# Patient Record
Sex: Female | Born: 1950 | Race: White | Hispanic: No | Marital: Married | Smoking: Current some day smoker
Health system: Southern US, Community
[De-identification: ages and names within clinical notes are randomized; demographics above are authoritative.]

## PROBLEM LIST (undated history)

## (undated) DIAGNOSIS — I2121 ST elevation (STEMI) myocardial infarction involving left circumflex coronary artery: Secondary | ICD-10-CM

## (undated) DIAGNOSIS — Z9861 Coronary angioplasty status: Secondary | ICD-10-CM

## (undated) DIAGNOSIS — C3490 Malignant neoplasm of unspecified part of unspecified bronchus or lung: Secondary | ICD-10-CM

## (undated) DIAGNOSIS — I10 Essential (primary) hypertension: Secondary | ICD-10-CM

## (undated) DIAGNOSIS — Z720 Tobacco use: Secondary | ICD-10-CM

## (undated) DIAGNOSIS — Z955 Presence of coronary angioplasty implant and graft: Secondary | ICD-10-CM

## (undated) DIAGNOSIS — E785 Hyperlipidemia, unspecified: Secondary | ICD-10-CM

## (undated) DIAGNOSIS — I2510 Atherosclerotic heart disease of native coronary artery without angina pectoris: Secondary | ICD-10-CM

## (undated) DIAGNOSIS — M1990 Unspecified osteoarthritis, unspecified site: Secondary | ICD-10-CM

## (undated) HISTORY — PX: OTHER SURGICAL HISTORY: SHX169

## (undated) HISTORY — PX: CHOLECYSTECTOMY: SHX55

## (undated) HISTORY — PX: TONSILLECTOMY: SUR1361

## (undated) HISTORY — PX: TUBAL LIGATION: SHX77

---

## 1998-07-11 ENCOUNTER — Other Ambulatory Visit: Admission: RE | Admit: 1998-07-11 | Discharge: 1998-07-11 | Payer: Self-pay | Admitting: *Deleted

## 1999-06-08 ENCOUNTER — Other Ambulatory Visit: Admission: RE | Admit: 1999-06-08 | Discharge: 1999-06-08 | Payer: Self-pay | Admitting: *Deleted

## 2000-07-25 ENCOUNTER — Other Ambulatory Visit: Admission: RE | Admit: 2000-07-25 | Discharge: 2000-07-25 | Payer: Self-pay | Admitting: *Deleted

## 2008-05-03 HISTORY — PX: KNEE ARTHROSCOPY: SUR90

## 2017-02-19 ENCOUNTER — Emergency Department (HOSPITAL_COMMUNITY): Payer: Medicare Other

## 2017-02-19 ENCOUNTER — Encounter (HOSPITAL_COMMUNITY)
Admission: EM | Disposition: A | Discharge status: Home / Self Care | Payer: Self-pay | Source: Home / Self Care | Attending: Cardiology

## 2017-02-19 ENCOUNTER — Inpatient Hospital Stay (HOSPITAL_COMMUNITY): Payer: Medicare Other

## 2017-02-19 ENCOUNTER — Encounter (HOSPITAL_COMMUNITY): Payer: Self-pay | Admitting: Emergency Medicine

## 2017-02-19 ENCOUNTER — Inpatient Hospital Stay (HOSPITAL_COMMUNITY)
Admission: EM | Admit: 2017-02-19 | Discharge: 2017-02-22 | DRG: 247 | Disposition: A | Discharge status: Home / Self Care | Payer: Medicare Other | Attending: Cardiology | Admitting: Cardiology

## 2017-02-19 DIAGNOSIS — R079 Chest pain, unspecified: Secondary | ICD-10-CM | POA: Diagnosis present

## 2017-02-19 DIAGNOSIS — E876 Hypokalemia: Secondary | ICD-10-CM | POA: Diagnosis not present

## 2017-02-19 DIAGNOSIS — R112 Nausea with vomiting, unspecified: Secondary | ICD-10-CM | POA: Diagnosis not present

## 2017-02-19 DIAGNOSIS — I2129 ST elevation (STEMI) myocardial infarction involving other sites: Secondary | ICD-10-CM

## 2017-02-19 DIAGNOSIS — I2121 ST elevation (STEMI) myocardial infarction involving left circumflex coronary artery: Secondary | ICD-10-CM | POA: Diagnosis not present

## 2017-02-19 DIAGNOSIS — Z23 Encounter for immunization: Secondary | ICD-10-CM

## 2017-02-19 DIAGNOSIS — Z8249 Family history of ischemic heart disease and other diseases of the circulatory system: Secondary | ICD-10-CM | POA: Diagnosis not present

## 2017-02-19 DIAGNOSIS — I4729 Other ventricular tachycardia: Secondary | ICD-10-CM

## 2017-02-19 DIAGNOSIS — Z9861 Coronary angioplasty status: Secondary | ICD-10-CM

## 2017-02-19 DIAGNOSIS — Z955 Presence of coronary angioplasty implant and graft: Secondary | ICD-10-CM | POA: Diagnosis not present

## 2017-02-19 DIAGNOSIS — E782 Mixed hyperlipidemia: Secondary | ICD-10-CM | POA: Diagnosis present

## 2017-02-19 DIAGNOSIS — I472 Ventricular tachycardia: Secondary | ICD-10-CM | POA: Diagnosis not present

## 2017-02-19 DIAGNOSIS — L03114 Cellulitis of left upper limb: Secondary | ICD-10-CM | POA: Diagnosis not present

## 2017-02-19 DIAGNOSIS — E785 Hyperlipidemia, unspecified: Secondary | ICD-10-CM | POA: Diagnosis present

## 2017-02-19 DIAGNOSIS — I213 ST elevation (STEMI) myocardial infarction of unspecified site: Secondary | ICD-10-CM | POA: Insufficient documentation

## 2017-02-19 DIAGNOSIS — I25110 Atherosclerotic heart disease of native coronary artery with unstable angina pectoris: Secondary | ICD-10-CM | POA: Diagnosis present

## 2017-02-19 DIAGNOSIS — F17210 Nicotine dependence, cigarettes, uncomplicated: Secondary | ICD-10-CM | POA: Diagnosis present

## 2017-02-19 DIAGNOSIS — I2510 Atherosclerotic heart disease of native coronary artery without angina pectoris: Secondary | ICD-10-CM

## 2017-02-19 DIAGNOSIS — Z720 Tobacco use: Secondary | ICD-10-CM

## 2017-02-19 DIAGNOSIS — I10 Essential (primary) hypertension: Secondary | ICD-10-CM | POA: Diagnosis present

## 2017-02-19 HISTORY — DX: Atherosclerotic heart disease of native coronary artery without angina pectoris: I25.10

## 2017-02-19 HISTORY — DX: Tobacco use: Z72.0

## 2017-02-19 HISTORY — DX: Hyperlipidemia, unspecified: E78.5

## 2017-02-19 HISTORY — DX: Essential (primary) hypertension: I10

## 2017-02-19 HISTORY — PX: LEFT HEART CATH AND CORONARY ANGIOGRAPHY: CATH118249

## 2017-02-19 HISTORY — PX: CORONARY STENT INTERVENTION: CATH118234

## 2017-02-19 HISTORY — DX: Coronary angioplasty status: Z98.61

## 2017-02-19 HISTORY — DX: ST elevation (STEMI) myocardial infarction involving left circumflex coronary artery: I21.21

## 2017-02-19 HISTORY — DX: Presence of coronary angioplasty implant and graft: Z95.5

## 2017-02-19 LAB — PROTIME-INR
INR: 0.87
Prothrombin Time: 11.7 seconds (ref 11.4–15.2)

## 2017-02-19 LAB — CBC WITH DIFFERENTIAL/PLATELET
BASOS ABS: 0.1 10*3/uL (ref 0.0–0.1)
BASOS PCT: 1 %
EOS ABS: 0.1 10*3/uL (ref 0.0–0.7)
EOS PCT: 1 %
HCT: 43 % (ref 36.0–46.0)
HEMOGLOBIN: 15.1 g/dL — AB (ref 12.0–15.0)
LYMPHS ABS: 1.7 10*3/uL (ref 0.7–4.0)
Lymphocytes Relative: 18 %
MCH: 31.7 pg (ref 26.0–34.0)
MCHC: 35.1 g/dL (ref 30.0–36.0)
MCV: 90.1 fL (ref 78.0–100.0)
Monocytes Absolute: 0.6 10*3/uL (ref 0.1–1.0)
Monocytes Relative: 6 %
NEUTROS PCT: 74 %
Neutro Abs: 6.9 10*3/uL (ref 1.7–7.7)
PLATELETS: 203 10*3/uL (ref 150–400)
RBC: 4.77 MIL/uL (ref 3.87–5.11)
RDW: 12.8 % (ref 11.5–15.5)
WBC: 9.3 10*3/uL (ref 4.0–10.5)

## 2017-02-19 LAB — I-STAT CHEM 8, ED
BUN: 11 mg/dL (ref 6–20)
CALCIUM ION: 0.95 mmol/L — AB (ref 1.15–1.40)
Chloride: 107 mmol/L (ref 101–111)
Creatinine, Ser: 0.6 mg/dL (ref 0.44–1.00)
Glucose, Bld: 138 mg/dL — ABNORMAL HIGH (ref 65–99)
HEMATOCRIT: 45 % (ref 36.0–46.0)
HEMOGLOBIN: 15.3 g/dL — AB (ref 12.0–15.0)
Potassium: 4.4 mmol/L (ref 3.5–5.1)
SODIUM: 139 mmol/L (ref 135–145)
TCO2: 24 mmol/L (ref 22–32)

## 2017-02-19 LAB — MAGNESIUM: Magnesium: 2.1 mg/dL (ref 1.7–2.4)

## 2017-02-19 LAB — LIPID PANEL
CHOL/HDL RATIO: 4 ratio
Cholesterol: 188 mg/dL (ref 0–200)
HDL: 47 mg/dL (ref >40)
LDL CALC: 128 mg/dL — AB (ref 0–99)
Triglycerides: 65 mg/dL (ref <150)
VLDL: 13 mg/dL (ref 0–40)

## 2017-02-19 LAB — I-STAT TROPONIN, ED: TROPONIN I, POC: 0.16 ng/mL — AB (ref 0.00–0.08)

## 2017-02-19 LAB — CBC
HEMATOCRIT: 42.5 % (ref 36.0–46.0)
Hemoglobin: 14.8 g/dL (ref 12.0–15.0)
MCH: 31.2 pg (ref 26.0–34.0)
MCHC: 34.8 g/dL (ref 30.0–36.0)
MCV: 89.7 fL (ref 78.0–100.0)
Platelets: 217 10*3/uL (ref 150–400)
RBC: 4.74 MIL/uL (ref 3.87–5.11)
RDW: 12.8 % (ref 11.5–15.5)
WBC: 12.5 10*3/uL — ABNORMAL HIGH (ref 4.0–10.5)

## 2017-02-19 LAB — APTT: APTT: 28 s (ref 24–36)

## 2017-02-19 LAB — HEMOGLOBIN A1C
Hgb A1c MFr Bld: 5.5 % (ref 4.8–5.6)
Mean Plasma Glucose: 111.15 mg/dL

## 2017-02-19 LAB — CREATININE, SERUM: CREATININE: 0.64 mg/dL (ref 0.44–1.00)

## 2017-02-19 LAB — TSH: TSH: 0.757 u[IU]/mL (ref 0.350–4.500)

## 2017-02-19 LAB — TROPONIN I: TROPONIN I: 2.22 ng/mL — AB (ref <0.03)

## 2017-02-19 LAB — MRSA PCR SCREENING: MRSA by PCR: NEGATIVE

## 2017-02-19 SURGERY — LEFT HEART CATH AND CORONARY ANGIOGRAPHY
Anesthesia: LOCAL

## 2017-02-19 MED ORDER — ASPIRIN EC 81 MG PO TBEC: 81.0000 mg | DELAYED_RELEASE_TABLET | Freq: Every day | ORAL | Status: DC

## 2017-02-19 MED ORDER — NITROGLYCERIN IN D5W 200-5 MCG/ML-% IV SOLN
INTRAVENOUS | Status: AC | PRN
  Administered 2017-02-19: 10 ug/min via INTRAVENOUS

## 2017-02-19 MED ORDER — HEPARIN SODIUM (PORCINE) 1000 UNIT/ML IJ SOLN
INTRAMUSCULAR | Status: AC
  Filled 2017-02-19: qty 1

## 2017-02-19 MED ORDER — IOPAMIDOL (ISOVUE-370) INJECTION 76%
INTRAVENOUS | Status: DC | PRN
  Administered 2017-02-19: 170 mL via INTRA_ARTERIAL

## 2017-02-19 MED ORDER — SODIUM CHLORIDE 0.9% FLUSH
3.0000 mL | Freq: Two times a day (BID) | INTRAVENOUS | Status: DC
  Administered 2017-02-19: 10 mL via INTRAVENOUS
  Administered 2017-02-20: 3 mL via INTRAVENOUS
  Administered 2017-02-20: 10 mL via INTRAVENOUS
  Administered 2017-02-21: 3 mL via INTRAVENOUS

## 2017-02-19 MED ORDER — IOPAMIDOL (ISOVUE-370) INJECTION 76%
INTRAVENOUS | Status: AC
  Filled 2017-02-19: qty 125

## 2017-02-19 MED ORDER — LIDOCAINE HCL 2 % IJ SOLN
INTRAMUSCULAR | Status: AC
  Filled 2017-02-19: qty 10

## 2017-02-19 MED ORDER — TIROFIBAN (AGGRASTAT) BOLUS VIA INFUSION
INTRAVENOUS | Status: DC | PRN
  Administered 2017-02-19: 2050 ug via INTRAVENOUS

## 2017-02-19 MED ORDER — MIDAZOLAM HCL 2 MG/2ML IJ SOLN
INTRAMUSCULAR | Status: DC | PRN
  Administered 2017-02-19: 1 mg via INTRAVENOUS

## 2017-02-19 MED ORDER — METOPROLOL TARTRATE 25 MG PO TABS
25.0000 mg | ORAL_TABLET | Freq: Two times a day (BID) | ORAL | Status: DC
  Administered 2017-02-19 – 2017-02-22 (×6): 25 mg via ORAL
  Filled 2017-02-19 (×6): qty 1

## 2017-02-19 MED ORDER — SODIUM CHLORIDE 0.9 % IV SOLN: 250.0000 mL | INTRAVENOUS | Status: DC | PRN

## 2017-02-19 MED ORDER — VERAPAMIL HCL 2.5 MG/ML IV SOLN
INTRAVENOUS | Status: AC
  Filled 2017-02-19: qty 2

## 2017-02-19 MED ORDER — TICAGRELOR 90 MG PO TABS
ORAL_TABLET | ORAL | Status: DC | PRN
  Administered 2017-02-19: 180 mg via ORAL

## 2017-02-19 MED ORDER — HEPARIN SODIUM (PORCINE) 5000 UNIT/ML IJ SOLN: 5000.0000 [IU] | Freq: Three times a day (TID) | INTRAMUSCULAR | Status: DC

## 2017-02-19 MED ORDER — NITROGLYCERIN 1 MG/10 ML FOR IR/CATH LAB
INTRA_ARTERIAL | Status: AC
  Filled 2017-02-19: qty 10

## 2017-02-19 MED ORDER — TICAGRELOR 90 MG PO TABS
ORAL_TABLET | ORAL | Status: AC
  Filled 2017-02-19: qty 2

## 2017-02-19 MED ORDER — TIROFIBAN HCL IN NACL 5-0.9 MG/100ML-% IV SOLN
0.1500 ug/kg/min | INTRAVENOUS | Status: DC
  Administered 2017-02-19: 0.15 ug/kg/min via INTRAVENOUS
  Filled 2017-02-19: qty 100

## 2017-02-19 MED ORDER — HYDRALAZINE HCL 20 MG/ML IJ SOLN: 5.0000 mg | INTRAMUSCULAR | Status: AC | PRN

## 2017-02-19 MED ORDER — NITROGLYCERIN IN D5W 200-5 MCG/ML-% IV SOLN
0.0000 ug/min | INTRAVENOUS | Status: DC
  Administered 2017-02-19: 10 ug/min via INTRAVENOUS

## 2017-02-19 MED ORDER — ACETAMINOPHEN 325 MG PO TABS: 650.0000 mg | ORAL_TABLET | ORAL | Status: DC | PRN

## 2017-02-19 MED ORDER — TIROFIBAN HCL IV 12.5 MG/250 ML
INTRAVENOUS | Status: AC | PRN
  Administered 2017-02-19: .15 ug/kg/min via INTRAVENOUS

## 2017-02-19 MED ORDER — SODIUM CHLORIDE 0.9% FLUSH: 3.0000 mL | INTRAVENOUS | Status: DC | PRN

## 2017-02-19 MED ORDER — LABETALOL HCL 5 MG/ML IV SOLN: 10.0000 mg | INTRAVENOUS | Status: AC | PRN

## 2017-02-19 MED ORDER — SODIUM CHLORIDE 0.9 % IV SOLN
INTRAVENOUS | Status: AC
  Administered 2017-02-19: 18:00:00 via INTRAVENOUS

## 2017-02-19 MED ORDER — NITROGLYCERIN 0.4 MG SL SUBL: 0.4000 mg | SUBLINGUAL_TABLET | SUBLINGUAL | Status: DC | PRN

## 2017-02-19 MED ORDER — HEPARIN SODIUM (PORCINE) 5000 UNIT/ML IJ SOLN: 4000.0000 [IU] | Freq: Once | INTRAMUSCULAR | Status: DC

## 2017-02-19 MED ORDER — TIROFIBAN HCL IN NACL 5-0.9 MG/100ML-% IV SOLN
0.1500 ug/kg/min | INTRAVENOUS | Status: AC
  Administered 2017-02-20 (×2): 0.15 ug/kg/min via INTRAVENOUS
  Filled 2017-02-19 (×3): qty 100

## 2017-02-19 MED ORDER — ONDANSETRON HCL 4 MG/2ML IJ SOLN
4.0000 mg | Freq: Four times a day (QID) | INTRAMUSCULAR | Status: DC | PRN
  Administered 2017-02-20: 4 mg via INTRAVENOUS
  Filled 2017-02-19: qty 2

## 2017-02-19 MED ORDER — ASPIRIN 81 MG PO CHEW
81.0000 mg | CHEWABLE_TABLET | Freq: Every day | ORAL | Status: DC
  Administered 2017-02-20 – 2017-02-22 (×3): 81 mg via ORAL
  Filled 2017-02-19 (×3): qty 1

## 2017-02-19 MED ORDER — SODIUM CHLORIDE 0.9 % IV SOLN
INTRAVENOUS | Status: DC
  Administered 2017-02-19: 18:00:00 via INTRAVENOUS

## 2017-02-19 MED ORDER — TICAGRELOR 90 MG PO TABS
90.0000 mg | ORAL_TABLET | Freq: Two times a day (BID) | ORAL | Status: DC
  Administered 2017-02-20 – 2017-02-21 (×3): 90 mg via ORAL
  Filled 2017-02-19 (×3): qty 1

## 2017-02-19 MED ORDER — TIROFIBAN HCL IN NACL 5-0.9 MG/100ML-% IV SOLN
INTRAVENOUS | Status: AC
  Filled 2017-02-19: qty 100

## 2017-02-19 MED ORDER — VERAPAMIL HCL 2.5 MG/ML IV SOLN
INTRAVENOUS | Status: DC | PRN
  Administered 2017-02-19: 10 mL via INTRA_ARTERIAL

## 2017-02-19 MED ORDER — HEPARIN (PORCINE) IN NACL 2-0.9 UNIT/ML-% IJ SOLN
INTRAMUSCULAR | Status: AC
  Filled 2017-02-19: qty 1000

## 2017-02-19 MED ORDER — ATORVASTATIN CALCIUM 80 MG PO TABS
80.0000 mg | ORAL_TABLET | Freq: Every day | ORAL | Status: DC
  Administered 2017-02-19 – 2017-02-21 (×3): 80 mg via ORAL
  Filled 2017-02-19 (×3): qty 1

## 2017-02-19 MED ORDER — FENTANYL CITRATE (PF) 100 MCG/2ML IJ SOLN
INTRAMUSCULAR | Status: AC
  Filled 2017-02-19: qty 2

## 2017-02-19 MED ORDER — HEPARIN (PORCINE) IN NACL 2-0.9 UNIT/ML-% IJ SOLN
INTRAMUSCULAR | Status: AC | PRN
  Administered 2017-02-19: 1000 mL

## 2017-02-19 MED ORDER — HEPARIN SODIUM (PORCINE) 1000 UNIT/ML IJ SOLN
INTRAMUSCULAR | Status: DC | PRN
  Administered 2017-02-19 (×2): 4500 [IU] via INTRAVENOUS

## 2017-02-19 MED ORDER — MIDAZOLAM HCL 2 MG/2ML IJ SOLN
INTRAMUSCULAR | Status: AC
  Filled 2017-02-19: qty 2

## 2017-02-19 MED ORDER — LIDOCAINE HCL (PF) 1 % IJ SOLN
INTRAMUSCULAR | Status: DC | PRN
  Administered 2017-02-19: 2 mL via INTRADERMAL

## 2017-02-19 MED ORDER — NITROGLYCERIN IN D5W 200-5 MCG/ML-% IV SOLN
INTRAVENOUS | Status: AC
  Filled 2017-02-19: qty 250

## 2017-02-19 MED ORDER — FENTANYL CITRATE (PF) 100 MCG/2ML IJ SOLN
INTRAMUSCULAR | Status: DC | PRN
  Administered 2017-02-19: 25 ug via INTRAVENOUS

## 2017-02-19 MED ORDER — HEPARIN SODIUM (PORCINE) 5000 UNIT/ML IJ SOLN
5000.0000 [IU] | Freq: Three times a day (TID) | INTRAMUSCULAR | Status: DC
  Administered 2017-02-20 – 2017-02-21 (×4): 5000 [IU] via SUBCUTANEOUS
  Filled 2017-02-19 (×4): qty 1

## 2017-02-19 SURGICAL SUPPLY — 20 items
BALLN SAPPHIRE 2.5X12 (BALLOONS) ×2
BALLN ~~LOC~~ EUPHORA RX 3.75X12 (BALLOONS) ×2
BALLOON SAPPHIRE 2.5X12 (BALLOONS) IMPLANT
BALLOON ~~LOC~~ EUPHORA RX 3.75X12 (BALLOONS) IMPLANT
CATH INFINITI 5 FR JL3.5 (CATHETERS) ×1 IMPLANT
CATH INFINITI 5FR ANG PIGTAIL (CATHETERS) ×1 IMPLANT
CATH INFINITI JR4 5F (CATHETERS) ×1 IMPLANT
CATH LAUNCHER 6FR EBU3.5 (CATHETERS) ×1 IMPLANT
DEVICE RAD COMP TR BAND LRG (VASCULAR PRODUCTS) ×1 IMPLANT
GLIDESHEATH SLEND SS 6F .021 (SHEATH) ×1 IMPLANT
GUIDEWIRE INQWIRE 1.5J.035X260 (WIRE) IMPLANT
INQWIRE 1.5J .035X260CM (WIRE) ×2
KIT HEART LEFT (KITS) ×2 IMPLANT
PACK CARDIAC CATHETERIZATION (CUSTOM PROCEDURE TRAY) ×2 IMPLANT
STENT SIERRA 3.50 X 15 MM (Permanent Stent) ×1 IMPLANT
SYR MEDRAD MARK V 150ML (SYRINGE) ×2 IMPLANT
TRANSDUCER W/STOPCOCK (MISCELLANEOUS) ×2 IMPLANT
TUBING CIL FLEX 10 FLL-RA (TUBING) ×2 IMPLANT
WIRE ASAHI PROWATER 180CM (WIRE) ×1 IMPLANT
WIRE HI TORQ WHISPER MS 190CM (WIRE) ×1 IMPLANT

## 2017-02-19 NOTE — ED Triage Notes (Signed)
Per EMS, pt from home with c/o centralized chest pain that began at 1400 today, numbness and tingling to the left arm. Pt reports similar episode two days ago, but symptoms resolved. Given 2 NTG and 324 aspirin PTA. Chest pain resolved at this time. EMS vitals: BP 176/102, HR-70-80, SpO2-97% room air.

## 2017-02-19 NOTE — ED Notes (Addendum)
Kimberly Huang (husband) [phone]

## 2017-02-19 NOTE — Plan of Care (Signed)
Problem: Cardiovascular: Goal: Ability to achieve and maintain adequate cardiovascular perfusion will improve Outcome: Progressing RN assessed patients RUE for perfusion as this is where the TR band is.  Patient able to maintain a level 0-level 1.

## 2017-02-19 NOTE — ED Provider Notes (Signed)
[facility] EMERGENCY DEPARTMENT Provider Note   CSN: [id number] Arrival date & time: 02/19/17  1536     History   Chief Complaint Chief Complaint  Patient presents with  . Code STEMI    HPI Kimberly Huang is a 66 y.o. female.  66 year old female history of depression, hypothyroid, and OSA who presents via EMS with concern for posterior STEMI.  Patient describes onset of substernal burning sensation that she thought was reflux.  Began having radiation of numbness to her left arm.  She called for EMS and received 324 aspirin and 2 nitroglycerin, which appeared to help the chest pain.  She has had no previous MI or cardiac stents.  EMS EKG concerning for ST elevation in V7-9 of posterior EKG with reciprocal depressions.  Patient currently with minimal chest pain on arrival.  The history is provided by the patient, medical records and the EMS personnel. No language interpreter was used.    Past Medical History:  Diagnosis Date  . Depression   . Fibromyalgia   . Hypothyroid   . OSA (obstructive sleep apnea)   . Restless leg syndrome     There are no active problems to display for this patient.   Past Surgical History:  Procedure Laterality Date  . ABDOMINAL HYSTERECTOMY    . chocolate cyst    . CHOLECYSTECTOMY    . TONSILECTOMY, ADENOIDECTOMY, BILATERAL MYRINGOTOMY AND TUBES      OB History    No data available       Home Medications    Prior to Admission medications   Medication Sig Start Date End Date Taking? Authorizing Provider  DULoxetine (CYMBALTA) 60 MG capsule Take 60 mg by mouth daily.      [provider]  levothyroxine (SYNTHROID, LEVOTHROID) 88 MCG tablet Take 88 mcg by mouth daily.      [provider]  methylphenidate (CONCERTA) 54 MG CR tablet Take 54 mg by mouth every morning.      [provider]  simethicone (MYLICON) 80 MG chewable tablet Chew 80 mg by mouth every 6 (six) hours as needed.       [provider]  TraZODone HCl 150 MG TB24 Take 2 tablets by mouth at bedtime.      [provider]    Family History No family history on file.  Social History Social History  Substance Use Topics  . Smoking status: Never Smoker  . Smokeless tobacco: Never Used  . Alcohol use No     Allergies   Patient has no known allergies.   Review of Systems Review of Systems  Constitutional: Negative for chills and fever.  HENT: Negative for ear pain and sore throat.   Eyes: Negative for pain and visual disturbance.  Respiratory: Negative for cough and shortness of breath.   Cardiovascular: Positive for chest pain. Negative for palpitations.  Gastrointestinal: Negative for abdominal pain and vomiting.  Genitourinary: Negative for dysuria and hematuria.  Musculoskeletal: Negative for arthralgias and back pain.  Skin: Negative for color change and rash.  Neurological: Negative for seizures and syncope.  All other systems reviewed and are negative.    Physical Exam Updated Vital Signs BP (!) 170/89 (BP Location: Left Arm)   Pulse 80   Resp 16   SpO2 98%   Physical Exam  Constitutional: She appears well-developed and well-nourished. No distress.  HENT:  Head: Normocephalic and atraumatic.  Eyes: Conjunctivae are normal.  Neck: Neck supple.  Cardiovascular: Normal rate  and regular rhythm.   No murmur heard. Pulmonary/Chest: Effort normal and breath sounds normal. No respiratory distress. She has no wheezes. She has no rales.  Abdominal: Soft. There is no tenderness.  Musculoskeletal: She exhibits no edema.  Neurological: She is alert. No cranial nerve deficit. Coordination normal.  5/5 motor strength and intact sensation in all extremities. Finger-to-nose intact bilaterally  Skin: Skin is warm and dry.  Nursing note and vitals reviewed.    ED Treatments / Results  Labs (all labs ordered are listed, but only abnormal results are displayed) Labs  Reviewed  CBC WITH DIFFERENTIAL/PLATELET  PROTIME-INR  APTT  COMPREHENSIVE METABOLIC PANEL  TROPONIN I  LIPID PANEL    EKG  EKG Interpretation None       Radiology No results found.  Procedures .Critical Care Performed by: Payton Emerald Authorized by: Payton Emerald   Critical care provider statement:    Critical care time (minutes):  31   Critical care time was exclusive of:  Separately billable procedures and treating other patients and teaching time   Critical care was necessary to treat or prevent imminent or life-threatening deterioration of the following conditions: posterior STEMI.   Critical care was time spent personally by me on the following activities:  Ordering and performing treatments and interventions, development of treatment plan with patient or surrogate, discussions with consultants, ordering and review of laboratory studies, ordering and review of radiographic studies, pulse oximetry, re-evaluation of patient's condition, evaluation of patient's response to treatment, examination of patient, review of old charts and obtaining history from patient or surrogate   I assumed direction of critical care for this patient from another provider in my specialty: no      (including critical care time)  Medications Ordered in ED Medications  0.9 %  sodium chloride infusion (not administered)  heparin injection 4,000 Units (not administered)     Initial Impression / Assessment and Plan / ED Course  I have reviewed the triage vital signs and the nursing notes.  Pertinent labs & imaging results that were available during my care of the patient were reviewed by me and considered in my medical decision making (see chart for details).     26 yoF who p/w chest pain and EKG concerning for posterior STEMI. Received ASA and NTG PTA. Minimal pain on arrival. AF, VSS. Lungs CTAB.  Cardiology evaluated pt at bedside. Agree on posterior STEMI. Pt given heparin. Pt taken emergently  to cath lab.  Pt care d/w Dr. Ellender Hose  Final Clinical Impressions(s) / ED Diagnoses   Final diagnoses:  Acute ST elevation myocardial infarction (STEMI) of posterior wall [facility])    New Prescriptions New Prescriptions   No medications on file     Payton Emerald, MD 02/20/17 1754    Duffy Bruce, MD 02/21/17 [phone]

## 2017-02-19 NOTE — Progress Notes (Signed)
Patient came in as a Code Stemmi. Chaplain came as soon as available and patient was visiting with hger 2 sons and requested I come back at 11:30 so she could have time with her sons. Conard Novak, Chaplain    02/19/17 2300  Clinical Encounter Type  Visited With Patient and family together  Visit Type Initial;Spiritual support  Spiritual Encounters  Spiritual Needs Prayer  Stress Factors  Patient Stress Factors None identified  Family Stress Factors None identified

## 2017-02-19 NOTE — H&P (Signed)
Cardiology Admission History and Physical:   Patient ID: Kimberly Huang; MRN: [id number]; DOB: [birth year]   Admission date: 02/19/2017  Primary Care Provider: No primary care provider on file. Primary Cardiologist: Audrie Kuri [name] MD new   Chief Complaint:  Chest pain  Patient Profile:   Kimberly Huang is a 66 y.o. female with a history of tobacco abuse who presents with acute posterior STEMI.   History of Present Illness:   Kimberly Huang reports that she developed acute indigestion with burning in her chest today at 2 pm. Sudden onset. Unrelieved with antacid therapy so EMS called. Ecg showed ST depression in the anterior leads. With posterior leads there was > 2 mm ST elevation. Code STEMI activated. Patient reports she had similar symptoms 2 days ago that resolved. No dyspnea, palpitations, diaphoresis. No edema or orthopnea. No prior cardiac history. Hasn't seen a doctor in several years. Denies history of DM, HTN, HLD. She does have a family history of CAD with younger sister having an MI. She did get partial relief of her chest pain with sl Ntg. Reports she is not taking any medication at home.   History reviewed. No pertinent past medical history.  Past Surgical History:  Procedure Laterality Date  . CHOLECYSTECTOMY    . tonsillectomy    . TUBAL LIGATION       Medications Prior to Admission: none  Allergies:   No Known Allergies  Social History:   Social History   Social History  . Marital status: Married    Spouse name: N/A  . Number of children: 3  . Years of education: N/A   Occupational History  . Not on file.   Social History Main Topics  . Smoking status: Current Every Day Smoker    Packs/day: 1.00    Types: Cigarettes  . Smokeless tobacco: Never Used  . Alcohol use No  . Drug use: Unknown  . Sexual activity: Not on file   Other Topics Concern  . Not on file   Social History Narrative   Family runs the [employer].    Family History:     The patient's family history includes Diabetes in her mother; Heart attack in her sister.    ROS:  Please see the history of present illness.  All other ROS reviewed and negative.     Physical Exam/Data:   Vitals:   02/19/17 1700 02/19/17 1705 02/19/17 1716 02/19/17 1745  BP:   139/85   Pulse: (!) 0 (!) 0 85   Resp: 12 (!) 0 17   Temp:   98.4 F (36.9 C)   TempSrc:   Oral   SpO2: (!) 0% (!) 0% 97%   Weight:   180 lb (81.6 kg) 173 lb 12.8 oz (78.8 kg)  Height:   5\' 4"  (1.626 m)    No intake or output data in the 24 hours ending 02/19/17 1756 Filed Weights   02/19/17 1716 02/19/17 1745  Weight: 180 lb (81.6 kg) 173 lb 12.8 oz (78.8 kg)   Body mass index is 29.83 kg/m.  General:  Well nourished, well developed, in no acute distress HEENT: normal Lymph: no adenopathy Neck: no JVD or bruits Endocrine:  No thryomegaly Vascular: No carotid bruits; FA pulses 2+ bilaterally without bruits  Cardiac:  normal S1, S2; RRR; no murmur Lungs:  clear to auscultation bilaterally, no wheezing, rhonchi or rales  Abd: soft, nontender, no hepatomegaly  Ext: no edema, pulses 2+= Musculoskeletal:  No deformities, BUE and  BLE strength normal and equal Skin: warm and dry  Neuro:  CNs 2-12 intact, no focal abnormalities noted Psych:  Normal affect    EKG:  The ECG that was done today was personally reviewed and demonstrates NSR with posterior ST elevation.   Relevant CV Studies: none  Laboratory Data:  Chemistry  Recent Labs Lab 02/19/17 1547  NA 139  K 4.4  CL 107  GLUCOSE 138*  BUN 11  CREATININE 0.60    No results for input(s): PROT, ALBUMIN, AST, ALT, ALKPHOS, BILITOT in the last 168 hours. Hematology  Recent Labs Lab 02/19/17 1547 02/19/17 1549  WBC  --  9.3  RBC  --  4.77  HGB 15.3* 15.1*  HCT 45.0 43.0  MCV  --  90.1  MCH  --  31.7  MCHC  --  35.1  RDW  --  12.8  PLT  --  203   Cardiac EnzymesNo results for input(s): TROPONINI in the last 168 hours.    Recent Labs Lab 02/19/17 1545  TROPIPOC 0.16*    BNPNo results for input(s): BNP, PROBNP in the last 168 hours.  DDimer No results for input(s): DDIMER in the last 168 hours.  Radiology/Studies:  Dg Chest Port 1 View  Result Date: 02/19/2017 CLINICAL DATA:  Chest pain, recent cardiac catheterization EXAM: PORTABLE CHEST 1 VIEW COMPARISON:  None. FINDINGS: The heart size and mediastinal contours are within normal limits. Both lungs are clear. The visualized skeletal structures are unremarkable. IMPRESSION: No active disease. Electronically Signed   By: Inez Catalina M.D.   On: 02/19/2017 17:44    Assessment and Plan:   1. Acute posterior STEMI. Patient given ASA and sl Ntg. Will proceed with emergent cardiac cath +/- PCI. Start statin, beta blocker.  2. Elevated BP without prior diagnosis of HTN 3. Tobacco abuse needs cessation counseling.   Severity of Illness: The appropriate patient status for this patient is INPATIENT. Inpatient status is judged to be reasonable and necessary in order to provide the required intensity of service to ensure the patient's safety. The patient's presenting symptoms, physical exam findings, and initial radiographic and laboratory data in the context of their chronic comorbidities is felt to place them at high risk for further clinical deterioration. Furthermore, it is not anticipated that the patient will be medically stable for discharge from the hospital within 2 midnights of admission. The following factors support the patient status of inpatient.   " The patient's presenting symptoms include chest pain. " The worrisome physical exam findings include none. " The initial radiographic and laboratory data are worrisome because of ST elevation on Ecg. " The chronic co-morbidities include Tobacco abuse.   * I certify that at the point of admission it is my clinical judgment that the patient will require inpatient hospital care spanning beyond 2 midnights  from the point of admission due to high intensity of service, high risk for further deterioration and high frequency of surveillance required.*    For questions or updates, please contact [place] Please consult [email or web address] for contact info under Cardiology/STEMI.    Signed, Derk Doubek [name], MD  02/19/2017 5:56 PM

## 2017-02-20 DIAGNOSIS — I2121 ST elevation (STEMI) myocardial infarction involving left circumflex coronary artery: Principal | ICD-10-CM

## 2017-02-20 DIAGNOSIS — R112 Nausea with vomiting, unspecified: Secondary | ICD-10-CM

## 2017-02-20 DIAGNOSIS — E876 Hypokalemia: Secondary | ICD-10-CM

## 2017-02-20 DIAGNOSIS — I4729 Other ventricular tachycardia: Secondary | ICD-10-CM

## 2017-02-20 DIAGNOSIS — I472 Ventricular tachycardia: Secondary | ICD-10-CM

## 2017-02-20 LAB — LIPID PANEL
Cholesterol: 182 mg/dL (ref 0–200)
HDL: 46 mg/dL (ref >40)
LDL CALC: 120 mg/dL — AB (ref 0–99)
Total CHOL/HDL Ratio: 4 RATIO
Triglycerides: 79 mg/dL (ref <150)
VLDL: 16 mg/dL (ref 0–40)

## 2017-02-20 LAB — BASIC METABOLIC PANEL
Anion gap: 8 (ref 5–15)
BUN: 5 mg/dL — ABNORMAL LOW (ref 6–20)
CHLORIDE: 108 mmol/L (ref 101–111)
CO2: 24 mmol/L (ref 22–32)
CREATININE: 0.66 mg/dL (ref 0.44–1.00)
Calcium: 8.5 mg/dL — ABNORMAL LOW (ref 8.9–10.3)
GFR calc non Af Amer: >60 mL/min (ref >60)
Glucose, Bld: 121 mg/dL — ABNORMAL HIGH (ref 65–99)
POTASSIUM: 3.2 mmol/L — AB (ref 3.5–5.1)
Sodium: 140 mmol/L (ref 135–145)

## 2017-02-20 LAB — CBC
HEMATOCRIT: 41.7 % (ref 36.0–46.0)
HEMOGLOBIN: 14.3 g/dL (ref 12.0–15.0)
MCH: 30.7 pg (ref 26.0–34.0)
MCHC: 34.3 g/dL (ref 30.0–36.0)
MCV: 89.5 fL (ref 78.0–100.0)
Platelets: 227 10*3/uL (ref 150–400)
RBC: 4.66 MIL/uL (ref 3.87–5.11)
RDW: 12.9 % (ref 11.5–15.5)
WBC: 11.3 10*3/uL — ABNORMAL HIGH (ref 4.0–10.5)

## 2017-02-20 LAB — TROPONIN I
Troponin I: 11.29 ng/mL (ref <0.03)
Troponin I: 13.76 ng/mL (ref <0.03)

## 2017-02-20 MED ORDER — ISOSORBIDE MONONITRATE ER 30 MG PO TB24
30.0000 mg | ORAL_TABLET | Freq: Every day | ORAL | Status: DC
  Administered 2017-02-20 – 2017-02-21 (×2): 30 mg via ORAL
  Filled 2017-02-20 (×2): qty 1

## 2017-02-20 MED ORDER — POTASSIUM CHLORIDE CRYS ER 20 MEQ PO TBCR
40.0000 meq | EXTENDED_RELEASE_TABLET | ORAL | Status: AC
  Administered 2017-02-20 (×2): 40 meq via ORAL
  Filled 2017-02-20 (×2): qty 2

## 2017-02-20 NOTE — Progress Notes (Signed)
Repeat ECG without change

## 2017-02-20 NOTE — ED Provider Notes (Signed)
I saw and evaluated the patient, reviewed the resident's note and I agree with the findings and plan. I was present and provided direct supervision for all procedures. Please see associated encounter note. Briefly, the patient is a 66 yo F here with acute onset crushing substernal CP. EKG c/f inferior STEMI. Protecting airway on arrival. STEMI activated and pt taken to cath lab. ASA, heparin ordered. Doubt dissection or PE.   EKG Interpretation  Date/Time:  Saturday February 19 2017 15:37:05 EDT Ventricular Rate:  78 PR Interval:    QRS Duration: 93 QT Interval:  385 QTC Calculation: 439 R Axis:   -55 Text Interpretation:  Sinus rhythm Prolonged PR interval Left anterior fascicular block LVH with secondary repolarization abnormality ST depressions in inferior and lateral leads, with concern for POSTERIOR STEMI Confirmed by Duffy Bruce [phone]) on 02/20/2017 12:00:24 PM         Duffy Bruce, MD 02/20/17 1200

## 2017-02-20 NOTE — Progress Notes (Signed)
Progress Note  Patient Name: Kimberly Huang Date of Encounter: 02/20/2017  Primary Cardiologist: PJ     Patient Profile     66 y.o. female  Admitted 10/20 with acute posterior STEMI Cath>>OM1 99%>>o  RCA 90 EF normal with Lateral HK  Anticipate staged PCI monday Subjective   No further retrosternal burning Nauseated and vomiting while   Inpatient Medications    Scheduled Meds: . aspirin  81 mg Oral Daily  . atorvastatin  80 mg Oral q1800  . heparin  5,000 Units Subcutaneous Q8H  . metoprolol tartrate  25 mg Oral BID  . sodium chloride flush  3 mL Intravenous Q12H  . ticagrelor  90 mg Oral BID   Continuous Infusions: . sodium chloride 10 mL/hr at 02/19/17 1757  . sodium chloride    . nitroGLYCERIN 20 mcg/min (02/19/17 2314)  . tirofiban 0.15 mcg/kg/min (02/20/17 0105)   PRN Meds: sodium chloride, acetaminophen, nitroGLYCERIN, ondansetron (ZOFRAN) IV, sodium chloride flush   Vital Signs    Vitals:   02/20/17 0500 02/20/17 0600 02/20/17 0700 02/20/17 0733  BP: (!) 141/84 (!) 155/77 140/79   Pulse: (!) 58 (!) 59 (!) 56   Resp: 19 18 19    Temp:    98.5 F (36.9 C)  TempSrc:    Oral  SpO2: 94% 95% 95%   Weight:      Height:        Intake/Output Summary (Last 24 hours) at 02/20/17 0753 Last data filed at 02/20/17 0700  Gross per 24 hour  Intake          1143.51 ml  Output             1520 ml  Net          -376.49 ml   Filed Weights   02/19/17 1716 02/19/17 1745  Weight: 180 lb (81.6 kg) 173 lb 12.8 oz (78.8 kg)    Telemetry    VTNS 16 btt - Personally Reviewed  ECG   no furterh ST changes 10/Personally Reviewed  Physical Exam   GEN: No acute distress.   Neck: JVDflat Cardiac: RRR, no  murmurs, rubs, or gallops.  Respiratory: Clear to auscultation bilaterally. GI: Soft, nontender, non-distended  MS:  edema; No deformity. Neuro:  Nonfocal  Psych: Normal affect  Skin Warm and dry   Labs    Chemistry Recent Labs Lab 02/19/17 1547  02/19/17 1758 02/20/17 0524  NA 139  --  140  K 4.4  --  3.2*  CL 107  --  108  CO2  --   --  24  GLUCOSE 138*  --  121*  BUN 11  --  5*  CREATININE 0.60 0.64 0.66  CALCIUM  --   --  8.5*  GFRNONAA  --  >60 >60  GFRAA  --  >60 >60  ANIONGAP  --   --  8     Hematology Recent Labs Lab 02/19/17 1549 02/19/17 1758 02/20/17 0524  WBC 9.3 12.5* 11.3*  RBC 4.77 4.74 4.66  HGB 15.1* 14.8 14.3  HCT 43.0 42.5 41.7  MCV 90.1 89.7 89.5  MCH 31.7 31.2 30.7  MCHC 35.1 34.8 34.3  RDW 12.8 12.8 12.9  PLT 203 217 227    Cardiac Enzymes Recent Labs Lab 02/19/17 1758 02/20/17 0024 02/20/17 0524  TROPONINI 2.22* 11.29* 13.76*    Recent Labs Lab 02/19/17 1545  TROPIPOC 0.16*     BNPNo results for input(s): BNP, PROBNP in the last  168 hours.   DDimer No results for input(s): DDIMER in the last 168 hours.   Radiology    Dg Chest Port 1 View  Result Date: 02/19/2017 CLINICAL DATA:  Chest pain, recent cardiac catheterization EXAM: PORTABLE CHEST 1 VIEW COMPARISON:  None. FINDINGS: The heart size and mediastinal contours are within normal limits. Both lungs are clear. The visualized skeletal structures are unremarkable. IMPRESSION: No active disease. Electronically Signed   By: Inez Catalina M.D.   On: 02/19/2017 17:44    Cardiac Studies   As above       Assessment & Plan  STEMI Posterior  2V CAD  Hypertension  VT NS new   Hypokalemia new  Nausea/Vomiting new  Will recheck 12 lead   Suspect VT related to low K wil replete and follow  Staged PCI in am  K repleted  Will use NTG IV>>PO  for BP today with anticipation of ARB/amlodipine post revascularization    Signed, Virl Axe, MD  02/20/2017, 7:53 AM

## 2017-02-20 NOTE — Plan of Care (Signed)
Problem: Activity: Goal: Ability to return to baseline activity level will improve Outcome: Progressing Patient ambulated to BR with assistance, gait steady.   Problem: Cardiovascular: Goal: Vascular access site(s) Level 0-1 will be maintained Outcome: Progressing TR band discontinued and site is a level 1 with no complications noted.

## 2017-02-21 ENCOUNTER — Encounter (HOSPITAL_COMMUNITY): Payer: Self-pay | Admitting: Cardiology

## 2017-02-21 ENCOUNTER — Inpatient Hospital Stay (HOSPITAL_COMMUNITY)
Admission: EM | Disposition: A | Discharge status: Home / Self Care | Payer: Self-pay | Source: Home / Self Care | Attending: Cardiology

## 2017-02-21 DIAGNOSIS — I10 Essential (primary) hypertension: Secondary | ICD-10-CM

## 2017-02-21 DIAGNOSIS — I2129 ST elevation (STEMI) myocardial infarction involving other sites: Secondary | ICD-10-CM | POA: Diagnosis present

## 2017-02-21 DIAGNOSIS — Z9861 Coronary angioplasty status: Secondary | ICD-10-CM

## 2017-02-21 DIAGNOSIS — E785 Hyperlipidemia, unspecified: Secondary | ICD-10-CM

## 2017-02-21 DIAGNOSIS — Z955 Presence of coronary angioplasty implant and graft: Secondary | ICD-10-CM

## 2017-02-21 DIAGNOSIS — E782 Mixed hyperlipidemia: Secondary | ICD-10-CM | POA: Diagnosis present

## 2017-02-21 HISTORY — PX: CORONARY STENT INTERVENTION: CATH118234

## 2017-02-21 HISTORY — DX: Presence of coronary angioplasty implant and graft: Z95.5

## 2017-02-21 HISTORY — PX: CARDIAC CATHETERIZATION: SHX172

## 2017-02-21 HISTORY — DX: Hyperlipidemia, unspecified: E78.5

## 2017-02-21 HISTORY — DX: Essential (primary) hypertension: I10

## 2017-02-21 LAB — BASIC METABOLIC PANEL
Anion gap: 9 (ref 5–15)
BUN: 8 mg/dL (ref 6–20)
CO2: 23 mmol/L (ref 22–32)
CREATININE: 0.67 mg/dL (ref 0.44–1.00)
Calcium: 8.6 mg/dL — ABNORMAL LOW (ref 8.9–10.3)
Chloride: 106 mmol/L (ref 101–111)
GFR calc Af Amer: >60 mL/min (ref >60)
GLUCOSE: 96 mg/dL (ref 65–99)
Potassium: 3.9 mmol/L (ref 3.5–5.1)
SODIUM: 138 mmol/L (ref 135–145)

## 2017-02-21 LAB — POCT ACTIVATED CLOTTING TIME
ACTIVATED CLOTTING TIME: 246 s
ACTIVATED CLOTTING TIME: 378 s

## 2017-02-21 SURGERY — CORONARY STENT INTERVENTION
Anesthesia: LOCAL

## 2017-02-21 MED ORDER — SODIUM CHLORIDE 0.9 % WEIGHT BASED INFUSION
3.0000 mL/kg/h | INTRAVENOUS | Status: DC
  Administered 2017-02-21: 3 mL/kg/h via INTRAVENOUS

## 2017-02-21 MED ORDER — ASPIRIN 81 MG PO CHEW
81.0000 mg | CHEWABLE_TABLET | ORAL | Status: AC
  Administered 2017-02-21: 81 mg via ORAL
  Filled 2017-02-21: qty 1

## 2017-02-21 MED ORDER — SODIUM CHLORIDE 0.9 % IV SOLN: 250.0000 mL | INTRAVENOUS | Status: DC | PRN

## 2017-02-21 MED ORDER — HEPARIN SODIUM (PORCINE) 1000 UNIT/ML IJ SOLN
INTRAMUSCULAR | Status: AC
  Filled 2017-02-21: qty 1

## 2017-02-21 MED ORDER — LIDOCAINE HCL 2 % IJ SOLN
INTRAMUSCULAR | Status: AC
  Filled 2017-02-21: qty 10

## 2017-02-21 MED ORDER — CEPHALEXIN 500 MG PO CAPS
500.0000 mg | ORAL_CAPSULE | Freq: Three times a day (TID) | ORAL | Status: DC
  Administered 2017-02-21 – 2017-02-22 (×3): 500 mg via ORAL
  Filled 2017-02-21 (×4): qty 1

## 2017-02-21 MED ORDER — CEPHALEXIN 500 MG PO CAPS
500.0000 mg | ORAL_CAPSULE | Freq: Three times a day (TID) | ORAL | Status: DC
  Filled 2017-02-21 (×3): qty 1

## 2017-02-21 MED ORDER — SODIUM CHLORIDE 0.9% FLUSH: 3.0000 mL | INTRAVENOUS | Status: DC | PRN

## 2017-02-21 MED ORDER — ONDANSETRON HCL 4 MG/2ML IJ SOLN: 4.0000 mg | Freq: Four times a day (QID) | INTRAMUSCULAR | Status: DC | PRN

## 2017-02-21 MED ORDER — MIDAZOLAM HCL 2 MG/2ML IJ SOLN
INTRAMUSCULAR | Status: DC | PRN
  Administered 2017-02-21 (×2): 1 mg via INTRAVENOUS

## 2017-02-21 MED ORDER — LIDOCAINE HCL 2 % IJ SOLN
INTRAMUSCULAR | Status: DC | PRN
  Administered 2017-02-21: 3 mL

## 2017-02-21 MED ORDER — HEPARIN (PORCINE) IN NACL 2-0.9 UNIT/ML-% IJ SOLN
INTRAMUSCULAR | Status: AC
  Filled 2017-02-21: qty 1000

## 2017-02-21 MED ORDER — HYDRALAZINE HCL 20 MG/ML IJ SOLN: 5.0000 mg | INTRAMUSCULAR | Status: AC | PRN

## 2017-02-21 MED ORDER — ATORVASTATIN CALCIUM 80 MG PO TABS: 80.0000 mg | ORAL_TABLET | Freq: Every day | ORAL | Status: DC

## 2017-02-21 MED ORDER — OXYCODONE HCL 5 MG PO TABS: 5.0000 mg | ORAL_TABLET | ORAL | Status: DC | PRN

## 2017-02-21 MED ORDER — SODIUM CHLORIDE 0.9% FLUSH
3.0000 mL | Freq: Two times a day (BID) | INTRAVENOUS | Status: DC
  Administered 2017-02-22: 3 mL via INTRAVENOUS

## 2017-02-21 MED ORDER — ACETAMINOPHEN 325 MG PO TABS
650.0000 mg | ORAL_TABLET | ORAL | Status: DC | PRN
  Administered 2017-02-21: 650 mg via ORAL
  Filled 2017-02-21: qty 2

## 2017-02-21 MED ORDER — VERAPAMIL HCL 2.5 MG/ML IV SOLN
INTRAVENOUS | Status: DC | PRN
  Administered 2017-02-21: 10 mL via INTRA_ARTERIAL

## 2017-02-21 MED ORDER — HEPARIN SODIUM (PORCINE) 5000 UNIT/ML IJ SOLN
5000.0000 [IU] | Freq: Three times a day (TID) | INTRAMUSCULAR | Status: DC
Start: 2017-02-21 — End: 2017-02-22
  Administered 2017-02-21 – 2017-02-22 (×2): 5000 [IU] via SUBCUTANEOUS
  Filled 2017-02-21 (×2): qty 1

## 2017-02-21 MED ORDER — SODIUM CHLORIDE 0.9 % WEIGHT BASED INFUSION
1.0000 mL/kg/h | INTRAVENOUS | Status: AC
  Administered 2017-02-21: 1 mL/kg/h via INTRAVENOUS

## 2017-02-21 MED ORDER — NITROGLYCERIN 1 MG/10 ML FOR IR/CATH LAB
INTRA_ARTERIAL | Status: AC
  Filled 2017-02-21: qty 10

## 2017-02-21 MED ORDER — ONDANSETRON HCL 4 MG/2ML IJ SOLN
INTRAMUSCULAR | Status: DC | PRN
  Administered 2017-02-21: 4 mg via INTRAVENOUS

## 2017-02-21 MED ORDER — ONDANSETRON HCL 4 MG/2ML IJ SOLN
INTRAMUSCULAR | Status: AC
  Filled 2017-02-21: qty 2

## 2017-02-21 MED ORDER — IOPAMIDOL (ISOVUE-370) INJECTION 76%
INTRAVENOUS | Status: AC
  Filled 2017-02-21: qty 50

## 2017-02-21 MED ORDER — SODIUM CHLORIDE 0.9% FLUSH
3.0000 mL | Freq: Two times a day (BID) | INTRAVENOUS | Status: DC
  Administered 2017-02-21: 3 mL via INTRAVENOUS

## 2017-02-21 MED ORDER — HEPARIN (PORCINE) IN NACL 2-0.9 UNIT/ML-% IJ SOLN
INTRAMUSCULAR | Status: AC | PRN
  Administered 2017-02-21: 1000 mL

## 2017-02-21 MED ORDER — FENTANYL CITRATE (PF) 100 MCG/2ML IJ SOLN
INTRAMUSCULAR | Status: AC
  Filled 2017-02-21: qty 2

## 2017-02-21 MED ORDER — IOPAMIDOL (ISOVUE-370) INJECTION 76%
INTRAVENOUS | Status: AC
  Filled 2017-02-21: qty 125

## 2017-02-21 MED ORDER — SODIUM CHLORIDE 0.9 % WEIGHT BASED INFUSION: 1.0000 mL/kg/h | INTRAVENOUS | Status: DC

## 2017-02-21 MED ORDER — VERAPAMIL HCL 2.5 MG/ML IV SOLN
INTRAVENOUS | Status: AC
  Filled 2017-02-21: qty 2

## 2017-02-21 MED ORDER — MIDAZOLAM HCL 2 MG/2ML IJ SOLN
INTRAMUSCULAR | Status: AC
  Filled 2017-02-21: qty 2

## 2017-02-21 MED ORDER — ASPIRIN 81 MG PO CHEW: 81.0000 mg | CHEWABLE_TABLET | Freq: Every day | ORAL | Status: DC

## 2017-02-21 MED ORDER — IOPAMIDOL (ISOVUE-370) INJECTION 76%
INTRAVENOUS | Status: DC | PRN
  Administered 2017-02-21: 95 mL via INTRA_ARTERIAL

## 2017-02-21 MED ORDER — TICAGRELOR 90 MG PO TABS
90.0000 mg | ORAL_TABLET | Freq: Two times a day (BID) | ORAL | Status: DC
  Administered 2017-02-21 – 2017-02-22 (×2): 90 mg via ORAL
  Filled 2017-02-21 (×2): qty 1

## 2017-02-21 MED ORDER — FENTANYL CITRATE (PF) 100 MCG/2ML IJ SOLN
INTRAMUSCULAR | Status: DC | PRN
  Administered 2017-02-21: 50 ug via INTRAVENOUS

## 2017-02-21 MED ORDER — LABETALOL HCL 5 MG/ML IV SOLN: 10.0000 mg | INTRAVENOUS | Status: AC | PRN

## 2017-02-21 MED ORDER — HEPARIN SODIUM (PORCINE) 1000 UNIT/ML IJ SOLN
INTRAMUSCULAR | Status: DC | PRN
  Administered 2017-02-21: 2000 [IU] via INTRAVENOUS
  Administered 2017-02-21: 8000 [IU] via INTRAVENOUS

## 2017-02-21 MED ORDER — PNEUMOCOCCAL VAC POLYVALENT 25 MCG/0.5ML IJ INJ
0.5000 mL | INJECTION | INTRAMUSCULAR | Status: AC
  Administered 2017-02-22: 0.5 mL via INTRAMUSCULAR
  Filled 2017-02-21: qty 0.5

## 2017-02-21 MED FILL — Lidocaine HCl Local Inj 2%: INTRAMUSCULAR | Qty: 10 | Status: AC

## 2017-02-21 MED FILL — Nitroglycerin IV Soln 100 MCG/ML in D5W: INTRA_ARTERIAL | Qty: 10 | Status: AC

## 2017-02-21 SURGICAL SUPPLY — 16 items
BALLN EUPHORA RX 2.5X20 (BALLOONS) ×2
BALLOON EUPHORA RX 2.5X20 (BALLOONS) IMPLANT
CATH VISTA GUIDE 6FR XBRCA (CATHETERS) ×1 IMPLANT
COVER PRB 48X5XTLSCP FOLD TPE (BAG) IMPLANT
COVER PROBE 5X48 (BAG) ×2
DEVICE RAD COMP TR BAND LRG (VASCULAR PRODUCTS) ×1 IMPLANT
GLIDESHEATH SLEND A-KIT 6F 22G (SHEATH) ×1 IMPLANT
GUIDEWIRE INQWIRE 1.5J.035X260 (WIRE) IMPLANT
INQWIRE 1.5J .035X260CM (WIRE) ×2
KIT ENCORE 26 ADVANTAGE (KITS) ×2 IMPLANT
KIT HEART LEFT (KITS) ×2 IMPLANT
PACK CARDIAC CATHETERIZATION (CUSTOM PROCEDURE TRAY) ×2 IMPLANT
STENT RESOLUTE ONYX 3.0X26 (Permanent Stent) ×1 IMPLANT
TRANSDUCER W/STOPCOCK (MISCELLANEOUS) ×2 IMPLANT
TUBING CIL FLEX 10 FLL-RA (TUBING) ×2 IMPLANT
WIRE ASAHI PROWATER 180CM (WIRE) ×1 IMPLANT

## 2017-02-21 NOTE — Care Management Note (Signed)
Case Management Note  Patient Details  Name: Kimberly Huang MRN: [id number] Date of Birth: 1951-03-25  Subjective/Objective:    From home with spouse, pta indep, patient states she has Medicare / [facility] insurance and she has medication coverage, we are not showing this on file. She states she does not have a PCP , patient states she has someone in mind for her PCP,  She will follow up on her PCP, Patient will be on brilinta, she gave NCM the insurance information, (made copy) will take to ED Admissions.  Also patient is for another stent today.     Per benefit check co pay is 45.00, patient states she will go to [facility] in [place] near [place].  They do have in stock.            Action/Plan: NCM will follow for dc needs.   Expected Discharge Date:                  Expected Discharge Plan:  Home/Self Care  In-House Referral:     Discharge planning Services  CM Consult  Post Acute Care Choice:    Choice offered to:     DME Arranged:    DME Agency:     HH Arranged:    HH Agency:     Status of Service:  In process, will continue to follow  If discussed at Long Length of Stay Meetings, dates discussed:    Additional Comments:  Zenon Mayo, RN 02/21/2017, 2:28 PM

## 2017-02-21 NOTE — Progress Notes (Signed)
Progress Note  Patient Name: Kimberly Huang Date of Encounter: 02/21/2017  Primary Cardiologist: [name]  Subjective   Feels better - no further CP or SOB. Just feels tired - sleepy.  Walked with [place] 370 ft.  Inpatient Medications    Scheduled Meds: . aspirin  81 mg Oral Daily  . atorvastatin  80 mg Oral q1800  . heparin  5,000 Units Subcutaneous Q8H  . isosorbide mononitrate  30 mg Oral Daily  . metoprolol tartrate  25 mg Oral BID  . sodium chloride flush  3 mL Intravenous Q12H  . sodium chloride flush  3 mL Intravenous Q12H  . ticagrelor  90 mg Oral BID   Continuous Infusions: . sodium chloride Stopped (02/20/17 1100)  . sodium chloride    . sodium chloride    . sodium chloride 1 mL/kg/hr (02/21/17 0700)   PRN Meds: sodium chloride, sodium chloride, acetaminophen, nitroGLYCERIN, ondansetron (ZOFRAN) IV, sodium chloride flush, sodium chloride flush   Vital Signs    Vitals:   02/21/17 0500 02/21/17 0600 02/21/17 0700 02/21/17 0800  BP: (!) 139/112 (!) 143/78 138/65 136/79  Pulse: 65 64 60 71  Resp: 14 16 16 17   Temp:   98.2 F (36.8 C)   TempSrc:   Oral   SpO2: 100% 96% 98% 99%  Weight:  182 lb 1.6 oz (82.6 kg)    Height:        Intake/Output Summary (Last 24 hours) at 02/21/17 1116 Last data filed at 02/21/17 0800  Gross per 24 hour  Intake          1158.85 ml  Output             1475 ml  Net          -316.15 ml   Filed Weights   02/19/17 1745 02/21/17 0440 02/21/17 0600  Weight: 173 lb 12.8 oz (78.8 kg) 182 lb 1.6 oz (82.6 kg) 182 lb 1.6 oz (82.6 kg)    Telemetry    S Brady 50s-60s - Personally Reviewed  ECG    NO NEW EKG THIS AM - Personally Reviewed No further VT.  Physical Exam   Physical Exam  Constitutional: She is oriented to person, place, and time. She appears well-developed and well-nourished. No distress.  HENT:  Head: Normocephalic and atraumatic.  Eyes: Pupils are equal, round, and reactive to light. EOM are normal.   Neck: Normal range of motion. No hepatojugular reflux and no JVD present. Carotid bruit is not present.  Cardiovascular: Normal rate, regular rhythm and intact distal pulses.   No extrasystoles are present. PMI is not displaced.  Exam reveals no gallop and no S4.   No murmur heard. Pulmonary/Chest: Effort normal and breath sounds normal. No respiratory distress. She has no wheezes. She has no rales.  Abdominal: Soft. Bowel sounds are normal. She exhibits no distension. There is no tenderness. There is no rebound.  Musculoskeletal: Normal range of motion. She exhibits no edema.  R radial site c/d/i  Neurological: She is alert and oriented to person, place, and time. No cranial nerve deficit.  Skin: Skin is warm and dry. No rash noted. No erythema.  Psychiatric: She has a normal mood and affect. Her behavior is normal. Judgment and thought content normal.  Nursing note and vitals reviewed.   Labs    Chemistry Recent Labs Lab 02/19/17 1547 02/19/17 1758 02/20/17 0524 02/21/17 0203  NA 139  --  140 138  K 4.4  --  3.2* 3.9  CL 107  --  108 106  CO2  --   --  24 23  GLUCOSE 138*  --  121* 96  BUN 11  --  5* 8  CREATININE 0.60 0.64 0.66 0.67  CALCIUM  --   --  8.5* 8.6*  GFRNONAA  --  >60 >60 >60  GFRAA  --  >60 >60 >60  ANIONGAP  --   --  8 9     Hematology Recent Labs Lab 02/19/17 1549 02/19/17 1758 02/20/17 0524  WBC 9.3 12.5* 11.3*  RBC 4.77 4.74 4.66  HGB 15.1* 14.8 14.3  HCT 43.0 42.5 41.7  MCV 90.1 89.7 89.5  MCH 31.7 31.2 30.7  MCHC 35.1 34.8 34.3  RDW 12.8 12.8 12.9  PLT 203 217 227    Cardiac Enzymes Recent Labs Lab 02/19/17 1758 02/20/17 0024 02/20/17 0524  TROPONINI 2.22* 11.29* 13.76*    Recent Labs Lab 02/19/17 1545  TROPIPOC 0.16*     BNPNo results for input(s): BNP, PROBNP in the last 168 hours.   DDimer No results for input(s): DDIMER in the last 168 hours.   Radiology    Dg Chest Port 1 View  Result Date: 02/19/2017 CLINICAL  DATA:  Chest pain, recent cardiac catheterization EXAM: PORTABLE CHEST 1 VIEW COMPARISON:  None. FINDINGS: The heart size and mediastinal contours are within normal limits. Both lungs are clear. The visualized skeletal structures are unremarkable. IMPRESSION: No active disease. Electronically Signed   By: Inez Catalina M.D.   On: 02/19/2017 17:44    Cardiac Studies    CORONARY STENT INTERVENTION  LEFT HEART CATH AND CORONARY ANGIOGRAPHY  Conclusion     Prox LAD to Mid LAD lesion, 20 %stenosed.  Prox RCA to Mid RCA lesion, 90 %stenosed.  Prox Cx to Mid Cx lesion, 30 %stenosed.  The left ventricular systolic function is normal.  LV end diastolic pressure is moderately elevated.  The left ventricular ejection fraction is 50-55% by visual estimate.  1st Mrg lesion, 99 %stenosed.  A STENT SIERRA 3.50 X 15 MM drug eluting stent was successfully placed.  Post intervention, there is a 0% residual stenosis.  Lat 1st Mrg lesion, 100 %stenosed.   1. Severe 2 vessel obstructive CAD    - 99% thrombotic occlusion of first OM. This is a bifurcating vessel.     - 90% mid RCA-segmental 2. Good overall LV dysfunction with lateral HK 3. Moderately elevated LVEDP 4. Successful stenting of the first OM with DES. Distal embolization into the distal lateral OM branch.  Plan: DAPT for one year. Will treat with IV Aggrastat for 18 hours. Start high dose statin, beta blocker. IV Ntg for BP control acutely. Plan for stage PCI of RCA on Monday if no complication.  Diagnostic Diagram       Post-Intervention Diagram         Patient Profile      66 y.o. female  Admitted 10/20 with acute posterior STEMI Cath>>OM1 99%>>o             RCA 90 EF normal with Lateral HK  Anticipate staged PCI monday  Assessment & Plan    Principal Problem:   STEMI involving left circumflex coronary artery (HCC) Active Problems:   CAD S/P PCI OM1 99%-0%; Plan Staged PCI mRCA 90%.   Presence of drug-eluting stent  in L Cx: OM1 (Xience SIERRA DES 3.5 x 15)   Acute MI, true posterior wall, initial episode of care ([place])  NSVT (nonsustained ventricular tachycardia) (HCC) - Post STEMI   Tobacco abuse   Hyperlipidemia with target LDL less than 70   Essential hypertension  Plan - Staged PCI RCA today  STEMI - CAD-PCI: continue ASA/Brilinta; high dose Statin & Beta Blocker  Unable to titrate Metoprolol with resting HR in 50s currently   No further VT = electrolytes replete.  BP stable - can add ARB in AM post Cath.  Smoking cessation counseling provided  Full dose Statin.  Anticipate that she will be OK to transfer to [facility] post PCI today - maybe d/c in AM if stable.  CM assistance with Brilinta card.   Consent for PCI : case discussed with R/B/A/I - pt agrees to proceed.    For questions or updates, please contact [place] Please consult [email or web address] for contact info under Cardiology/STEMI.      Signed, Glenetta Hew, MD  02/21/2017, 11:16 AM

## 2017-02-21 NOTE — Progress Notes (Signed)
CARDIAC REHAB PHASE I   PRE:  Rate/Rhythm: 4 SR  BP:  Sitting: 136/79        SaO2: 97 RA  MODE:  Ambulation: 370 ft   POST:  Rate/Rhythm: 78 SR  BP:  Sitting: 159/95, 151/72 recheck         SaO2: 100 RA  Pt assisted to bathroom prior to ambulation. Pt ambulated 370 ft on RA, IV, handheld assist, steady gait, tolerated well with no complaints. Completed MI/stent education with [place], EP and pt at bedside.  Reviewed risk factors, tobacco cessation (gave pt fake cigarette), MI book, anti-platelet therapy, stent card, activity restrictions, ntg, exercise, heart healthy diet and phase 2 cardiac rehab. Pt verbalized understanding, receptive to education. Pt agrees to phase 2 cardiac rehab referral, will send to [facility] per pt request. Pt assisted to bathroom again, then to bed, call bell within reach. Will follow.   [place], RN, BSN 02/21/2017 10:12 AM

## 2017-02-21 NOTE — Interval H&P Note (Signed)
Cath Lab Visit (complete for each Cath Lab visit)  Clinical Evaluation Leading to the Procedure:   ACS: Yes.    Non-ACS:    Anginal Classification: CCS Huang  Anti-ischemic medical therapy: Maximal Therapy (2 or more classes of medications)  Non-Invasive Test Results: No non-invasive testing performed  Prior CABG: No previous CABG      History and Physical Interval Note:  02/21/2017 3:12 PM  [name]  has presented today for surgery, with the diagnosis of CAD  The various methods of treatment have been discussed with the patient and family. After consideration of risks, benefits and other options for treatment, the patient has consented to  Procedure(s): CORONARY STENT INTERVENTION (N/A) as a surgical intervention .  The patient's history has been reviewed, patient examined, no change in status, stable for surgery.  I have reviewed the patient's chart and labs.  Questions were answered to the patient's satisfaction.     Kimberly Huang

## 2017-02-21 NOTE — H&P (View-Only) (Signed)
Progress Note  Patient Name: Kimberly Huang Date of Encounter: 02/21/2017  Primary Cardiologist: [name]  Subjective   Feels better - no further CP or SOB. Just feels tired - sleepy.  Walked with [place] 370 ft.  Inpatient Medications    Scheduled Meds: . aspirin  81 mg Oral Daily  . atorvastatin  80 mg Oral q1800  . heparin  5,000 Units Subcutaneous Q8H  . isosorbide mononitrate  30 mg Oral Daily  . metoprolol tartrate  25 mg Oral BID  . sodium chloride flush  3 mL Intravenous Q12H  . sodium chloride flush  3 mL Intravenous Q12H  . ticagrelor  90 mg Oral BID   Continuous Infusions: . sodium chloride Stopped (02/20/17 1100)  . sodium chloride    . sodium chloride    . sodium chloride 1 mL/kg/hr (02/21/17 0700)   PRN Meds: sodium chloride, sodium chloride, acetaminophen, nitroGLYCERIN, ondansetron (ZOFRAN) IV, sodium chloride flush, sodium chloride flush   Vital Signs    Vitals:   02/21/17 0500 02/21/17 0600 02/21/17 0700 02/21/17 0800  BP: (!) 139/112 (!) 143/78 138/65 136/79  Pulse: 65 64 60 71  Resp: 14 16 16 17   Temp:   98.2 F (36.8 C)   TempSrc:   Oral   SpO2: 100% 96% 98% 99%  Weight:  182 lb 1.6 oz (82.6 kg)    Height:        Intake/Output Summary (Last 24 hours) at 02/21/17 1116 Last data filed at 02/21/17 0800  Gross per 24 hour  Intake          1158.85 ml  Output             1475 ml  Net          -316.15 ml   Filed Weights   02/19/17 1745 02/21/17 0440 02/21/17 0600  Weight: 173 lb 12.8 oz (78.8 kg) 182 lb 1.6 oz (82.6 kg) 182 lb 1.6 oz (82.6 kg)    Telemetry    S [place] 50s-60s - Personally Reviewed  ECG    NO NEW EKG THIS AM - Personally Reviewed No further VT.  Physical Exam   Physical Exam  Constitutional: She is oriented to person, place, and time. She appears well-developed and well-nourished. No distress.  HENT:  Head: Normocephalic and atraumatic.  Eyes: Pupils are equal, round, and reactive to light. EOM are normal.   Neck: Normal range of motion. No hepatojugular reflux and no JVD present. Carotid bruit is not present.  Cardiovascular: Normal rate, regular rhythm and intact distal pulses.   No extrasystoles are present. PMI is not displaced.  Exam reveals no gallop and no S4.   No murmur heard. Pulmonary/Chest: Effort normal and breath sounds normal. No respiratory distress. She has no wheezes. She has no rales.  Abdominal: Soft. Bowel sounds are normal. She exhibits no distension. There is no tenderness. There is no rebound.  Musculoskeletal: Normal range of motion. She exhibits no edema.  R radial site c/d/i  Neurological: She is alert and oriented to person, place, and time. No cranial nerve deficit.  Skin: Skin is warm and dry. No rash noted. No erythema.  Psychiatric: She has a normal mood and affect. Her behavior is normal. Judgment and thought content normal.  Nursing note and vitals reviewed.   Labs    Chemistry Recent Labs Lab 02/19/17 1547 02/19/17 1758 02/20/17 0524 02/21/17 0203  NA 139  --  140 138  K 4.4  --  3.2* 3.9  CL 107  --  108 106  CO2  --   --  24 23  GLUCOSE 138*  --  121* 96  BUN 11  --  5* 8  CREATININE 0.60 0.64 0.66 0.67  CALCIUM  --   --  8.5* 8.6*  GFRNONAA  --  >60 >60 >60  GFRAA  --  >60 >60 >60  ANIONGAP  --   --  8 9     Hematology Recent Labs Lab 02/19/17 1549 02/19/17 1758 02/20/17 0524  WBC 9.3 12.5* 11.3*  RBC 4.77 4.74 4.66  HGB 15.1* 14.8 14.3  HCT 43.0 42.5 41.7  MCV 90.1 89.7 89.5  MCH 31.7 31.2 30.7  MCHC 35.1 34.8 34.3  RDW 12.8 12.8 12.9  PLT 203 217 227    Cardiac Enzymes Recent Labs Lab 02/19/17 1758 02/20/17 0024 02/20/17 0524  TROPONINI 2.22* 11.29* 13.76*    Recent Labs Lab 02/19/17 1545  TROPIPOC 0.16*     BNPNo results for input(s): BNP, PROBNP in the last 168 hours.   DDimer No results for input(s): DDIMER in the last 168 hours.   Radiology    Dg Chest Port 1 View  Result Date: 02/19/2017 CLINICAL  DATA:  Chest pain, recent cardiac catheterization EXAM: PORTABLE CHEST 1 VIEW COMPARISON:  None. FINDINGS: The heart size and mediastinal contours are within normal limits. Both lungs are clear. The visualized skeletal structures are unremarkable. IMPRESSION: No active disease. Electronically Signed   By: Inez Catalina M.D.   On: 02/19/2017 17:44    Cardiac Studies    CORONARY STENT INTERVENTION  LEFT HEART CATH AND CORONARY ANGIOGRAPHY  Conclusion     Prox LAD to Mid LAD lesion, 20 %stenosed.  Prox RCA to Mid RCA lesion, 90 %stenosed.  Prox Cx to Mid Cx lesion, 30 %stenosed.  The left ventricular systolic function is normal.  LV end diastolic pressure is moderately elevated.  The left ventricular ejection fraction is 50-55% by visual estimate.  1st Mrg lesion, 99 %stenosed.  A STENT SIERRA 3.50 X 15 MM drug eluting stent was successfully placed.  Post intervention, there is a 0% residual stenosis.  Lat 1st Mrg lesion, 100 %stenosed.   1. Severe 2 vessel obstructive CAD    - 99% thrombotic occlusion of first OM. This is a bifurcating vessel.     - 90% mid RCA-segmental 2. Good overall LV dysfunction with lateral HK 3. Moderately elevated LVEDP 4. Successful stenting of the first OM with DES. Distal embolization into the distal lateral OM branch.  Plan: DAPT for one year. Will treat with IV Aggrastat for 18 hours. Start high dose statin, beta blocker. IV Ntg for BP control acutely. Plan for stage PCI of RCA on Monday if no complication.  Diagnostic Diagram       Post-Intervention Diagram         Patient Profile      66 y.o. female  Admitted 10/20 with acute posterior STEMI Cath>>OM1 99%>>o             RCA 90 EF normal with Lateral HK  Anticipate staged PCI monday  Assessment & Plan    Principal Problem:   STEMI involving left circumflex coronary artery (HCC) Active Problems:   CAD S/P PCI OM1 99%-0%; Plan Staged PCI mRCA 90%.   Presence of drug-eluting stent  in L Cx: OM1 (Xience SIERRA DES 3.5 x 15)   Acute MI, true posterior wall, initial episode of care ([place])  NSVT (nonsustained ventricular tachycardia) (HCC) - Post STEMI   Tobacco abuse   Hyperlipidemia with target LDL less than 70   Essential hypertension  Plan - Staged PCI RCA today  STEMI - CAD-PCI: continue ASA/Brilinta; high dose Statin & Beta Blocker  Unable to titrate Metoprolol with resting HR in 50s currently   No further VT = electrolytes replete.  BP stable - can add ARB in AM post Cath.  Smoking cessation counseling provided  Full dose Statin.  Anticipate that she will be OK to transfer to [facility] post PCI today - maybe d/c in AM if stable.  CM assistance with Brilinta card.   Consent for PCI : case discussed with R/B/A/I - pt agrees to proceed.    For questions or updates, please contact [place] Please consult [email or web address] for contact info under Cardiology/STEMI.      Signed, Glenetta Hew, MD  02/21/2017, 11:16 AM

## 2017-02-21 NOTE — Progress Notes (Signed)
#   7.  S/W KATIE @ [facility] # [phone]   BRILINTA 90 MG BID   COVER- YES  CO-PAY- $ 45.00  Q/L TWO PILLS PER DAY  TIER- 3 DRUG  PRIOR APPROVAL- NO   PHARMACY : ANY RETAIL

## 2017-02-22 ENCOUNTER — Encounter (HOSPITAL_COMMUNITY): Payer: Self-pay | Admitting: Interventional Cardiology

## 2017-02-22 DIAGNOSIS — I2129 ST elevation (STEMI) myocardial infarction involving other sites: Secondary | ICD-10-CM

## 2017-02-22 LAB — BASIC METABOLIC PANEL
ANION GAP: 7 (ref 5–15)
BUN: 6 mg/dL (ref 6–20)
CALCIUM: 8.4 mg/dL — AB (ref 8.9–10.3)
CO2: 24 mmol/L (ref 22–32)
Chloride: 108 mmol/L (ref 101–111)
Creatinine, Ser: 0.57 mg/dL (ref 0.44–1.00)
GFR calc non Af Amer: >60 mL/min (ref >60)
Glucose, Bld: 97 mg/dL (ref 65–99)
POTASSIUM: 3.4 mmol/L — AB (ref 3.5–5.1)
Sodium: 139 mmol/L (ref 135–145)

## 2017-02-22 LAB — CBC
HEMATOCRIT: 36.5 % (ref 36.0–46.0)
HEMOGLOBIN: 12.4 g/dL (ref 12.0–15.0)
MCH: 30.6 pg (ref 26.0–34.0)
MCHC: 34 g/dL (ref 30.0–36.0)
MCV: 90.1 fL (ref 78.0–100.0)
Platelets: 192 10*3/uL (ref 150–400)
RBC: 4.05 MIL/uL (ref 3.87–5.11)
RDW: 13 % (ref 11.5–15.5)
WBC: 9.4 10*3/uL (ref 4.0–10.5)

## 2017-02-22 LAB — POCT ACTIVATED CLOTTING TIME: ACTIVATED CLOTTING TIME: 428 s

## 2017-02-22 MED ORDER — ATORVASTATIN CALCIUM 80 MG PO TABS: 80.0000 mg | ORAL_TABLET | Freq: Every day | ORAL | 1 refills | Status: DC

## 2017-02-22 MED ORDER — ASPIRIN 81 MG PO CHEW: 81.0000 mg | CHEWABLE_TABLET | Freq: Every day | ORAL | Status: DC

## 2017-02-22 MED ORDER — TICAGRELOR 90 MG PO TABS: 90.0000 mg | ORAL_TABLET | Freq: Two times a day (BID) | ORAL | 1 refills | Status: DC

## 2017-02-22 MED ORDER — CEPHALEXIN 500 MG PO CAPS: 500.0000 mg | ORAL_CAPSULE | Freq: Three times a day (TID) | ORAL | 0 refills | Status: DC

## 2017-02-22 MED ORDER — INFLUENZA VAC SPLIT HIGH-DOSE 0.5 ML IM SUSY
0.5000 mL | PREFILLED_SYRINGE | Freq: Once | INTRAMUSCULAR | Status: AC
  Administered 2017-02-22: 0.5 mL via INTRAMUSCULAR
  Filled 2017-02-22: qty 0.5

## 2017-02-22 MED ORDER — METOPROLOL TARTRATE 25 MG PO TABS: 25.0000 mg | ORAL_TABLET | Freq: Two times a day (BID) | ORAL | 0 refills | Status: DC

## 2017-02-22 MED ORDER — INFLUENZA VAC SPLIT HIGH-DOSE 0.5 ML IM SUSY: 0.5000 mL | PREFILLED_SYRINGE | INTRAMUSCULAR | Status: DC

## 2017-02-22 MED ORDER — NITROGLYCERIN 0.4 MG SL SUBL: 0.4000 mg | SUBLINGUAL_TABLET | SUBLINGUAL | 2 refills | Status: DC | PRN

## 2017-02-22 MED ORDER — IRBESARTAN 75 MG PO TABS
75.0000 mg | ORAL_TABLET | Freq: Every day | ORAL | Status: DC
  Administered 2017-02-22: 75 mg via ORAL
  Filled 2017-02-22: qty 1

## 2017-02-22 MED ORDER — IRBESARTAN 75 MG PO TABS: 75.0000 mg | ORAL_TABLET | Freq: Every day | ORAL | 0 refills | Status: DC

## 2017-02-22 MED ORDER — POTASSIUM CHLORIDE CRYS ER 20 MEQ PO TBCR
40.0000 meq | EXTENDED_RELEASE_TABLET | Freq: Once | ORAL | Status: AC
Start: 2017-02-22 — End: 2017-02-22
  Administered 2017-02-22: 40 meq via ORAL
  Filled 2017-02-22: qty 2

## 2017-02-22 MED FILL — Heparin Sodium (Porcine) Inj 1000 Unit/ML: INTRAMUSCULAR | Qty: 10 | Status: AC

## 2017-02-22 MED FILL — Nitroglycerin IV Soln 100 MCG/ML in D5W: INTRA_ARTERIAL | Qty: 10 | Status: AC

## 2017-02-22 NOTE — Progress Notes (Signed)
Discharge packet given to patient. Education given about medication regimen and follow up appointments. Patient had no further questions at this time. IV removed and patient taken off telemetry. Patient discharged home with son and wheeled out to the car.

## 2017-02-22 NOTE — Progress Notes (Signed)
CARDIAC REHAB PHASE I   PRE:  Rate/Rhythm: 69 SR    BP: sitting 159/92 left forearm    SaO2:   MODE:  Ambulation: 370 ft   POST:  Rate/Rhythm: 90 SR    BP: sitting 109/82 left upper arm    SaO2:   Tolerated well. No c/o. Very talkative. BP before walk probably not accurate. Ed reviewed, reinforcing Brilinta and smoking cessation. Voiced understanding.  [id number]   [place], ACSM 02/22/2017 11:06 AM

## 2017-02-22 NOTE — Discharge Summary (Signed)
Discharge Summary    Patient ID: JAMILEX BOHNSACK,  MRN: [id number], DOB/AGE: 1951-02-05 66 y.o.  Admit date: 02/19/2017 Discharge date: 02/22/2017  Primary Care Provider: Patient, No Pcp Per Primary Cardiologist: [name]   Discharge Diagnoses    Principal Problem:   CAD S/P PCI OM1 99%-0%; Plan Staged PCI mRCA 90%. Active Problems:   Tobacco abuse   STEMI involving left circumflex coronary artery (HCC)   Hyperlipidemia with target LDL less than 70   Presence of drug-eluting stent in L Cx: OM1 (Xience SIERRA DES 3.5 x 15)   Essential hypertension   Acute MI, true posterior wall, initial episode of care [facility])   NSVT (nonsustained ventricular tachycardia) (HCC) - Post STEMI   Allergies Allergies  Allergen Reactions  . Codeine Nausea Only    Diagnostic Studies/Procedures    Cath: 02/19/17  Conclusion     Prox LAD to Mid LAD lesion, 20 %stenosed.  Prox RCA to Mid RCA lesion, 90 %stenosed.  Prox Cx to Mid Cx lesion, 30 %stenosed.  The left ventricular systolic function is normal.  LV end diastolic pressure is moderately elevated.  The left ventricular ejection fraction is 50-55% by visual estimate.  1st Mrg lesion, 99 %stenosed.  A STENT SIERRA 3.50 X 15 MM drug eluting stent was successfully placed.  Post intervention, there is a 0% residual stenosis.  Lat 1st Mrg lesion, 100 %stenosed.   1. Severe 2 vessel obstructive CAD    - 99% thrombotic occlusion of first OM. This is a bifurcating vessel.     - 90% mid RCA-segmental 2. Good overall LV dysfunction with lateral HK 3. Moderately elevated LVEDP 4. Successful stenting of the first OM with DES. Distal embolization into the distal lateral OM branch.  Plan: DAPT for one year. Will treat with IV Aggrastat for 18 hours. Start high dose statin, beta blocker. IV Ntg for BP control acutely. Plan for stage PCI of RCA on Monday if no complication.   Cath: 02/21/17  Conclusion    Successful stent  implantation in the proximal to mid RCA reducing a segmental 90% stenosis to 0% with TIMI grade 3 flow. Stent used was a 3.0 x 26 Onyx post dilated to 14 atm 2.  RECOMMENDATIONS:   Continue dual antiplatelet therapy (aspirin and Brilinta)  Discharge per discretion of IC team.    _____________   History of Present Illness     Ms. Franta is a 66 yo female with PMH of tobacco use who reported that she developed acute indigestion with burning in her chest today at 2 pm the day of admission. Sudden onset. Unrelieved with antacid therapy so EMS called. Ecg showed ST depression in the anterior leads. With posterior leads there was > 2 mm ST elevation. Code STEMI activated. Patient reported she had similar symptoms 2 days prior that resolved. No dyspnea, palpitations, diaphoresis. No edema or orthopnea. No prior cardiac history. Hasn't seen a doctor in several years. Denied history of DM, HTN, HLD. She did have a family history of CAD with younger sister having an MI. She did get partial relief of her chest pain with sl Ntg. She was brought to the cath lab emergently for cath.   Hospital Course    Underwent cardiac catheterization with Dr. [name] that showed severe 2 vessel CAD, 99% occlusion of first OM which is a bifurcating vessel. Also 90% mid RCA segment lesion. Overall LV function was noted at 50-55%, with lateral hypokinesis. Moderately elevated LVEDP with an  initial successful PCI/DES to first OM, but distal embolization of distal lateral OM branch. Plan for DA PT for one year with aspirin/Brilinta. She was continued on IV Aggrastat for 18 hours, and started on high dose statin beta blocker and continued on IV nitroglycerin. Planned for staged intervention of RCA. Troponin peaked at 13.76, LDL 128 hemoglobin A1c 5.5. Post Labs were stable. Did develop some transient hypo-kalemia that was corrected with oral supplement. Worked well with cardiac rehabilitation after initial cath. Blood pressure  tolerated the addition of beta blocker, and ARB was added. Underwent successful staged intervention to proximal/mid RCA with DES 1 02/21/17. No complications noted post cath. Labs the following morning showed creatinine 0.57 and hemoglobin 12.4. She worked well with cardiac rehabilitation with no complaints. Of note will be discharged on Keflex 558m TID 2/2 cellulitis of Left arm from IV infiltration.   DThayer Headingswas seen by Dr. HEllyn Hackand determined stable for discharge home. Follow up in the office has been arranged. Medications are listed below.   _____________  Discharge Vitals Blood pressure 135/71, pulse 61, temperature 98.8 F (37.1 C), temperature source Oral, resp. rate 16, height _0  (1.626 m), weight 183 lb 14.4 oz (83.4 kg), SpO2 96 %.  Filed Weights   02/21/17 0440 02/21/17 0600 02/22/17 0119  Weight: 182 lb 1.6 oz (82.6 kg) 182 lb 1.6 oz (82.6 kg) 183 lb 14.4 oz (83.4 kg)   General appearance: alert, cooperative, appears stated age and no distress Neck: no adenopathy, no carotid bruit and no JVD Lungs: clear to auscultation bilaterally, normal percussion bilaterally and non-labored Heart: regular rate and rhythm, S1, S2 normal, no murmur, click, rub or gallop and normal apical impulse Abdomen: soft, non-tender; bowel sounds normal; no masses,  no organomegaly Extremities: extremities normal, atraumatic, no cyanosis or edema and L forearm - IV site focal erythema has notably improved. no longer warm or tender. Pulses: 2+ and symmetric Neurologic: Grossly normal   Labs & Radiologic Studies    CBC  Recent Labs  02/19/17 1549  02/20/17 0524 02/22/17 0213  WBC 9.3  < > 11.3* 9.4  NEUTROABS 6.9  --   --   --   HGB 15.1*  < > 14.3 12.4  HCT 43.0  < > 41.7 36.5  MCV 90.1  < > 89.5 90.1  PLT 203  < > 227 192  < > = values in this interval not displayed. Basic Metabolic Panel  Recent Labs  02/19/17 1758  02/21/17 0203 02/22/17 0213  NA  --   < > 138 139    K  --   < > 3.9 3.4*  CL  --   < > 106 108  CO2  --   < > 23 24  GLUCOSE  --   < > 96 97  BUN  --   < > 8 6  CREATININE 0.64  < > 0.67 0.57  CALCIUM  --   < > 8.6* 8.4*  MG 2.1  --   --   --   < > = values in this interval not displayed. Liver Function Tests No results for input(s): AST, ALT, ALKPHOS, BILITOT, PROT, ALBUMIN in the last 72 hours. No results for input(s): LIPASE, AMYLASE in the last 72 hours. Cardiac Enzymes  Recent Labs  02/19/17 1758 02/20/17 0024 02/20/17 0524  TROPONINI 2.22* 11.29* 13.76*   BNP Invalid input(s): POCBNP D-Dimer No results for input(s): DDIMER in the last 72 hours. Hemoglobin A1C  Recent Labs  02/19/17 1758  HGBA1C 5.5   Fasting Lipid Panel  Recent Labs  02/20/17 0524  CHOL 182  HDL 46  LDLCALC 120*  TRIG 79  CHOLHDL 4.0   Thyroid Function Tests  Recent Labs  02/19/17 1758  TSH 0.757   _____________  Dg Chest Port 1 View  Result Date: 02/19/2017 CLINICAL DATA:  Chest pain, recent cardiac catheterization EXAM: PORTABLE CHEST 1 VIEW COMPARISON:  None. FINDINGS: The heart size and mediastinal contours are within normal limits. Both lungs are clear. The visualized skeletal structures are unremarkable. IMPRESSION: No active disease. Electronically Signed   By: Inez Catalina M.D.   On: 02/19/2017 17:44   Disposition   Pt is being discharged home today in good condition.  Follow-up Plans & Appointments    Follow-up Information    Almyra Deforest, [place] Follow up on 03/03/2017.   Specialties:  Cardiology, Radiology Why:  at 3:30pm for your follow up appt. Contact information: [address] [place] [address] [phone]          Discharge Instructions    AMB Referral to Cardiac Rehabilitation - Phase II    Complete by:  As directed    Diagnosis:  STEMI   Amb Referral to Cardiac Rehabilitation    Complete by:  As directed    Diagnosis:   Coronary Stents STEMI     Call MD for:  redness, tenderness,  or signs of infection (pain, swelling, redness, odor or green/yellow discharge around incision site)    Complete by:  As directed    Diet - low sodium heart healthy    Complete by:  As directed    Discharge instructions    Complete by:  As directed    Radial Site Care Refer to this sheet in the next few weeks. These instructions provide you with information on caring for yourself after your procedure. Your caregiver may also give you more specific instructions. Your treatment has been planned according to current medical practices, but problems sometimes occur. Call your caregiver if you have any problems or questions after your procedure. HOME CARE INSTRUCTIONS You may shower the day after the procedure.Remove the bandage (dressing) and gently wash the site with plain soap and water.Gently pat the site dry.  Do not apply powder or lotion to the site.  Do not submerge the affected site in water for 3 to 5 days.  Inspect the site at least twice daily.  Do not flex or bend the affected arm for 24 hours.  No lifting over 5 pounds (2.3 kg) for 5 days after your procedure.  Do not drive home if you are discharged the same day of the procedure. Have someone else drive you.  You may drive 24 hours after the procedure unless otherwise instructed by your caregiver.  What to expect: Any bruising will usually fade within 1 to 2 weeks.  Blood that collects in the tissue (hematoma) may be painful to the touch. It should usually decrease in size and tenderness within 1 to 2 weeks.  SEEK IMMEDIATE MEDICAL CARE IF: You have unusual pain at the radial site.  You have redness, warmth, swelling, or pain at the radial site.  You have drainage (other than a small amount of blood on the dressing).  You have chills.  You have a fever or persistent symptoms for more than 72 hours.  You have a fever and your symptoms suddenly get worse.  Your arm becomes pale, cool, tingly,  or numb.  You have heavy bleeding  from the site. Hold pressure on the site.   PLEASE DO NOT MISS ANY DOSES OF YOUR BRILINTA!!!!! Also keep a log of you blood pressures and bring back to your follow up appt. Please call the office with any questions.   Patients taking blood thinners should generally stay away from medicines like ibuprofen, Advil, Motrin, naproxen, and Aleve due to risk of stomach bleeding. You may take Tylenol as directed or talk to your primary doctor about alternatives.   Increase activity slowly    Complete by:  As directed       Discharge Medications     Medication List    STOP taking these medications   ibuprofen 200 MG tablet Commonly known as:  ADVIL,MOTRIN   TAGAMET PO     TAKE these medications   aspirin 81 MG chewable tablet Chew 1 tablet (81 mg total) by mouth daily.   atorvastatin 80 MG tablet Commonly known as:  LIPITOR Take 1 tablet (80 mg total) by mouth daily at 6 PM.   cephALEXin 500 MG capsule Commonly known as:  KEFLEX Take 1 capsule (500 mg total) by mouth every 8 (eight) hours.   irbesartan 75 MG tablet Commonly known as:  AVAPRO Take 1 tablet (75 mg total) by mouth daily.   metoprolol tartrate 25 MG tablet Commonly known as:  LOPRESSOR Take 1 tablet (25 mg total) by mouth 2 (two) times daily.   nitroGLYCERIN 0.4 MG SL tablet Commonly known as:  NITROSTAT Place 1 tablet (0.4 mg total) under the tongue every 5 (five) minutes x 3 doses as needed for chest pain.   ticagrelor 90 MG Tabs tablet Commonly known as:  BRILINTA Take 1 tablet (90 mg total) by mouth 2 (two) times daily.        Aspirin prescribed at discharge?  Yes High Intensity Statin Prescribed? (Lipitor 40-40m or Crestor 20-410m: Yes Beta Blocker Prescribed? Yes For EF <40%, was ACEI/ARB Prescribed? Yes ADP Receptor Inhibitor Prescribed? (i.e. Plavix etc.-Includes Medically Managed Patients): Yes For EF <40%, Aldosterone Inhibitor Prescribed? No: EF ok Was EF assessed during THIS  hospitalization? Yes Was Cardiac Rehab II ordered? (Included Medically managed Patients): Yes   Outstanding Labs/Studies   FLP/LFTs in 6 weeks if tolerating statin. BMET at follow up given addition of ARB.   Duration of Discharge Encounter   Greater than 30 minutes including physician time.  Signed, LiReino BellisP-C 02/22/2017, 12:48 PM   I have seen, examined and evaluated the patient this AM along with LiReino BellisNP-C.  After reviewing all the available data and chart, we discussed the patients laboratory, study & physical findings as well as symptoms in detail. I agree with her  findings, examination (performed by me) as well as the impression summary & d/c recommendations as per our discussion.    Attending adjustments noted in italics.    89.o.femaleAdmitted 10/20 with acute posterior STEMI Cath>>OM1 99%>>o RCA 90 - Staged PCI Monday 10/22.  EF normal with Lateral HK   Did well, no recurrent angina.  [place] no difficulties.    Started on Beta blocker - but not able to titrate up  Started Avapro 75 mg on d/c day.  DAPT with ASA/Brilinta  High dose Statin.  Mild Hypokalemia replete   During cath yesterday, the staff noted purulent drainage from the L AC IV site - started on Keflex on 10/22 - plan to complete 5 day course. - looks  notably improved today.  [place] for d/c on stable medication regimen.Glenetta Hew, M.D., M.S. Interventional Cardiologist   Pager # [phone] Phone # [phone] [address] [place], [place] [address]

## 2017-03-03 ENCOUNTER — Ambulatory Visit: Payer: Medicare Other | Admitting: Cardiology

## 2017-03-03 ENCOUNTER — Encounter: Payer: Self-pay | Admitting: Cardiology

## 2017-03-03 ENCOUNTER — Ambulatory Visit: Payer: Medicare Other | Admitting: Physician Assistant

## 2017-03-03 DIAGNOSIS — Z720 Tobacco use: Secondary | ICD-10-CM

## 2017-03-03 DIAGNOSIS — E785 Hyperlipidemia, unspecified: Secondary | ICD-10-CM

## 2017-03-03 DIAGNOSIS — I10 Essential (primary) hypertension: Secondary | ICD-10-CM

## 2017-03-03 DIAGNOSIS — I2121 ST elevation (STEMI) myocardial infarction involving left circumflex coronary artery: Secondary | ICD-10-CM | POA: Diagnosis not present

## 2017-03-03 NOTE — Assessment & Plan Note (Signed)
Posterior MI 02/19/17-treated with urgent CFX PCI, followed by staged RCA PCI

## 2017-03-03 NOTE — Patient Instructions (Signed)
Medication Instructions:  START- An over the counter Co-Q 10  If you need a refill on your cardiac medications before your next appointment, please call your pharmacy.  Labwork: None Ordered   Testing/Procedures: None Ordered   Follow-Up: Your physician wants you to follow-up in: 6 weeks with Dr [name].    Thank you for choosing [facility]!!

## 2017-03-03 NOTE — Progress Notes (Signed)
03/03/2017 [name]   05-31-1950  [id number]  Primary Physician Patient, No Pcp Per Primary Cardiologist: Dr [name]  HPI:  Pleasant 66 y/o female with no significant PMH, presented 02/19/17 with chest and posterior STEMI. She said her symptoms were mild "indigestion". They didn't go away after she took a Tagamet and she became concerned and called EMS. She notes her sister had just had an MI and this is why she ended up call EMS. She was taken directly to the cath lab and recieved a DES to her CFX. She had residual RCA disease that was intervened on 48 hours later. Her LVF was preserved. She is in the office today for follow up. She has done well since discharge, no further chest pain. She has noted "aching" in her thighs and low back.   Current Outpatient Prescriptions  Medication Sig Dispense Refill  . aspirin 81 MG chewable tablet Chew 1 tablet (81 mg total) by mouth daily.    Marland Kitchen atorvastatin (LIPITOR) 80 MG tablet Take 1 tablet (80 mg total) by mouth daily at 6 PM. 90 tablet 1  . irbesartan (AVAPRO) 75 MG tablet Take 1 tablet (75 mg total) by mouth daily. 90 tablet 0  . metoprolol tartrate (LOPRESSOR) 25 MG tablet Take 1 tablet (25 mg total) by mouth 2 (two) times daily. 180 tablet 0  . nitroGLYCERIN (NITROSTAT) 0.4 MG SL tablet Place 1 tablet (0.4 mg total) under the tongue every 5 (five) minutes x 3 doses as needed for chest pain. 25 tablet 2  . ticagrelor (BRILINTA) 90 MG TABS tablet Take 1 tablet (90 mg total) by mouth 2 (two) times daily. 180 tablet 1   No current facility-administered medications for this visit.     Allergies  Allergen Reactions  . Codeine Nausea Only    Past Medical History:  Diagnosis Date  . CAD S/P PCI OM1 99%-0%; Plan Staged PCI mRCA 90%. 02/19/2017   1. Severe 2 vessel obstructive CAD    - 99% thrombotic occlusion of first OM. This is a bifurcating vessel.     - 90% mid RCA-segmental 2. Good overall LV dysfunction with lateral HK 3. Moderately  elevated LVEDP 4. Successful stenting of the first OM with DES. Distal embolization into the distal lateral OM branch.  Plan: DAPT for one year. Will treat with IV Aggrastat for 18 hours. Start high dose statin, beta blocker. IV Ntg for BP control acutely. Plan for stage PCI of RCA on Monday 38/10 if no complication.  . Essential hypertension 02/21/2017  . Hyperlipidemia with target LDL less than 70 02/21/2017  . Presence of drug-eluting stent in L Cx: OM1 (Xience SIERRA DES 3.5 x 15) 02/21/2017  . STEMI involving left circumflex coronary artery ([place]) 02/19/2017  . Tobacco abuse 02/19/2017    Social History   Social History  . Marital status: Married    Spouse name: N/A  . Number of children: 3  . Years of education: N/A   Occupational History  . Not on file.   Social History Main Topics  . Smoking status: Current Some Day Smoker    Packs/day: 1.00    Types: Cigarettes  . Smokeless tobacco: Never Used     Comment: Trying to quit  . Alcohol use No  . Drug use: Unknown  . Sexual activity: Not on file   Other Topics Concern  . Not on file   Social History Narrative   Family runs the [employer].  Family History  Problem Relation Age of Onset  . Diabetes Mother   . Heart attack Sister      Review of Systems: General: negative for chills, fever, night sweats or weight changes.  Cardiovascular: negative for chest pain, dyspnea on exertion, edema, orthopnea, palpitations, paroxysmal nocturnal dyspnea or shortness of breath Dermatological: negative for rash Respiratory: negative for cough or wheezing Urologic: negative for hematuria Abdominal: negative for nausea, vomiting, diarrhea, bright red blood per rectum, melena, or hematemesis Neurologic: negative for visual changes, syncope, or dizziness All other systems reviewed and are otherwise negative except as noted above.    Blood pressure 110/68, pulse 72, height 5\' 4"  (1.626 m), weight 182 lb (82.6 kg).    General appearance: alert, cooperative and no distress Neck: no carotid bruit and no JVD Lungs: clear to auscultation bilaterally Heart: regular rate and rhythm Extremities: ecchymosis Rt for arm Skin: Skin color, texture, turgor normal. No rashes or lesions Neurologic: Grossly normal  EKG NSR, lateral TWI  ASSESSMENT AND PLAN:   STEMI involving left circumflex coronary artery (HCC) Posterior MI 02/19/17-treated with urgent CFX PCI, followed by staged RCA PCI  Hyperlipidemia with target LDL less than 70 She has noted some thigh and low back discomfort that sounds like it could be secondary to her statin Rx  Essential hypertension Controlled  Tobacco abuse She tells me she has pretty much quit   PLAN  I suggested she take CoQ 10 along with her statin. We discussed the importance of complete smoking cessation. She can start cardiac rehab in 5 weeks, f/u Dr [name] in 6 weeks.  Kerin Ransom PA-C 03/03/2017 4:44 PM

## 2017-03-03 NOTE — Assessment & Plan Note (Signed)
She tells me she has pretty much quit

## 2017-03-03 NOTE — Assessment & Plan Note (Signed)
Controlled.  

## 2017-03-03 NOTE — Assessment & Plan Note (Signed)
She has noted some thigh and low back discomfort that sounds like it could be secondary to her statin Rx

## 2017-03-10 ENCOUNTER — Ambulatory Visit: Payer: Medicare Other | Admitting: Cardiology

## 2017-03-15 ENCOUNTER — Ambulatory Visit: Payer: Self-pay | Admitting: Family Medicine

## 2017-03-23 ENCOUNTER — Telehealth: Payer: Self-pay

## 2017-03-23 NOTE — Telephone Encounter (Signed)
Pt called and needs Brilinta 90mg  BID samples. They are at the front desk for her to P/U

## 2017-04-04 ENCOUNTER — Other Ambulatory Visit: Payer: Self-pay | Admitting: Cardiology

## 2017-04-04 NOTE — Telephone Encounter (Signed)
Patient calling the office for samples of medication:   1.  What medication and dosage are you requesting samples for? Brilinta  2.  Are you currently out of this medication? Enough for today

## 2017-04-04 NOTE — Telephone Encounter (Signed)
Medication samples have been provided to the patient.  Drug name: Brilinta 90mg   Qty: 4 bottles = 32 tablets   LOT: [id number] x2, [id number] x2  Exp.Date: 09/2019, 12/2019  Samples left at front desk for patient pick-up. Patient notified.  Sheral Apley M 3:00 PM 04/04/2017

## 2017-04-20 ENCOUNTER — Telehealth: Payer: Self-pay | Admitting: Cardiology

## 2017-04-20 NOTE — Telephone Encounter (Signed)
Patient calling the office for samples of medication:   1.  What medication and dosage are you requesting samples for? Brilnta  2.  Are you currently out of this medication?  1 left

## 2017-04-20 NOTE — Telephone Encounter (Signed)
Returned call to patient Brilinta 90 mg samples left at [employer] office front desk.

## 2017-04-22 ENCOUNTER — Telehealth (HOSPITAL_COMMUNITY): Payer: Self-pay

## 2017-04-22 NOTE — Telephone Encounter (Signed)
Attempted to call patient in regards to Cardiac Rehab and verify insurance- Lm on vm

## 2017-05-06 ENCOUNTER — Telehealth: Payer: Self-pay | Admitting: Cardiology

## 2017-05-06 NOTE — Telephone Encounter (Signed)
Patient walked in office Brilinta 90 mg samples given to patient.

## 2017-05-06 NOTE — Telephone Encounter (Signed)
Patient calling the office for samples of medication:   1.  What medication and dosage are you requesting samples for? Brilinta 90 mg   2.  Are you currently out of this medication? Will run out on Saturday

## 2017-05-22 NOTE — Progress Notes (Signed)
05/23/2017 [name]   [birth year]  [id number]  Primary Physician Patient, No Pcp Per Primary Cardiologist: Dr [name]  HPI:  Pleasant 67 y/o female with no significant PMH, presented 02/19/17 with chest and posterior STEMI. She said her symptoms were mild "indigestion". They didn't go away after she took a Tagamet and she became concerned and called EMS. She notes her sister had just had an MI and this is why she ended up call EMS. She was taken directly to the cath lab and recieved a DES to her CFX. She had residual RCA disease that was intervened on 48 hours later. Her LVF was preserved.   On follow up today she is doing well. No chest pain or dyspnea. No palpitations. She is walking her dog several times a day. Never did start Cardiac Rehab. She has reduced smoking to 2-3 cigs/day. Tolerating medication well but Brilinta is costing $200/month.   Current Outpatient Medications  Medication Sig Dispense Refill  . aspirin 81 MG chewable tablet Chew 1 tablet (81 mg total) by mouth daily.    Marland Kitchen atorvastatin (LIPITOR) 80 MG tablet Take 1 tablet (80 mg total) by mouth daily at 6 PM. 90 tablet 1  . irbesartan (AVAPRO) 75 MG tablet Take 1 tablet (75 mg total) by mouth daily. 90 tablet 0  . metoprolol tartrate (LOPRESSOR) 25 MG tablet Take 1 tablet (25 mg total) by mouth 2 (two) times daily. 180 tablet 0  . nitroGLYCERIN (NITROSTAT) 0.4 MG SL tablet Place 1 tablet (0.4 mg total) under the tongue every 5 (five) minutes x 3 doses as needed for chest pain. 25 tablet 2  . clopidogrel (PLAVIX) 75 MG tablet Take 1 tablet (75 mg total) by mouth daily. 90 tablet 3   No current facility-administered medications for this visit.     Allergies  Allergen Reactions  . Codeine Nausea Only    Past Medical History:  Diagnosis Date  . CAD S/P PCI OM1 99%-0%; Plan Staged PCI mRCA 90%. 02/19/2017   1. Severe 2 vessel obstructive CAD    - 99% thrombotic occlusion of first OM. This is a bifurcating vessel.      - 90% mid RCA-segmental 2. Good overall LV dysfunction with lateral HK 3. Moderately elevated LVEDP 4. Successful stenting of the first OM with DES. Distal embolization into the distal lateral OM branch.  Plan: DAPT for one year. Will treat with IV Aggrastat for 18 hours. Start high dose statin, beta blocker. IV Ntg for BP control acutely. Plan for stage PCI of RCA on Monday 69/67 if no complication.  . Essential hypertension 02/21/2017  . Hyperlipidemia with target LDL less than 70 02/21/2017  . Presence of drug-eluting stent in L Cx: OM1 (Xience SIERRA DES 3.5 x 15) 02/21/2017  . STEMI involving left circumflex coronary artery ([place]) 02/19/2017  . Tobacco abuse 02/19/2017    Social History   Socioeconomic History  . Marital status: Married    Spouse name: Not on file  . Number of children: 3  . Years of education: Not on file  . Highest education level: Not on file  Social Needs  . Financial resource strain: Not on file  . Food insecurity - worry: Not on file  . Food insecurity - inability: Not on file  . Transportation needs - medical: Not on file  . Transportation needs - non-medical: Not on file  Occupational History  . Not on file  Tobacco Use  . Smoking status: Current Some Day  Smoker    Packs/day: 1.00    Types: Cigarettes  . Smokeless tobacco: Never Used  . Tobacco comment: Trying to quit  Substance and Sexual Activity  . Alcohol use: No  . Drug use: Not on file  . Sexual activity: Not on file  Other Topics Concern  . Not on file  Social History Narrative   Family runs the [employer].     Family History  Problem Relation Age of Onset  . Diabetes Mother   . Heart attack Sister      Review of Systems: As noted in HPI All other systems reviewed and are otherwise negative except as noted above.    Blood pressure 134/70, pulse 64, height 5\' 4"  (1.626 m), weight 187 lb 6 oz (85 kg).  GENERAL:  Well appearing WF in NAD HEENT:  PERRL, EOMI, sclera are  clear. Oropharynx is clear. NECK:  No jugular venous distention, carotid upstroke brisk and symmetric, no bruits, no thyromegaly or adenopathy LUNGS:  Clear to auscultation bilaterally CHEST:  Unremarkable HEART:  RRR,  PMI not displaced or sustained,S1 and S2 within normal limits, no S3, no S4: no clicks, no rubs, no murmurs ABD:  Soft, nontender. BS +, no masses or bruits. No hepatomegaly, no splenomegaly EXT:  2 + pulses throughout, no edema, no cyanosis no clubbing SKIN:  Warm and dry.  No rashes NEURO:  Alert and oriented x 3. Cranial nerves II through XII intact. PSYCH:  Cognitively intact  Laboratory data:  Lab Results  Component Value Date   WBC 9.4 02/22/2017   HGB 12.4 02/22/2017   HCT 36.5 02/22/2017   PLT 192 02/22/2017   GLUCOSE 97 02/22/2017   CHOL 182 02/20/2017   TRIG 79 02/20/2017   HDL 46 02/20/2017   LDLCALC 120 (H) 02/20/2017   NA 139 02/22/2017   K 3.4 (L) 02/22/2017   CL 108 02/22/2017   CREATININE 0.57 02/22/2017   BUN 6 02/22/2017   CO2 24 02/22/2017   TSH 0.757 02/19/2017   INR 0.87 02/19/2017   HGBA1C 5.5 02/19/2017      ASSESSMENT AND PLAN:  1. CAD s/p lateral STEMI in October 2018 treated with DES of LCx. Staged PCI of the RCA. Doing well. No angina. Recommend DAPT for one year. Given cost issues with Brilinta will switch to plavix with 300 mg load followed by 75 mg daily.  2. Hypercholesterolemia. On high dose statin. Will schedule for fasting lab work 3. Tobacco abuse. Encourage complete cessation. OK to use nicotine lozenges to help.    PLAN  I suggested she take CoQ 10 along with her statin. We discussed the importance of complete smoking cessation. She can start cardiac rehab in 5 weeks, f/u Dr [name] in 6 weeks.  Greenleigh Kauth [name] MD,FACC 05/23/2017 5:05 PM

## 2017-05-23 ENCOUNTER — Telehealth (HOSPITAL_COMMUNITY): Payer: Self-pay

## 2017-05-23 ENCOUNTER — Ambulatory Visit: Payer: Medicare Other | Admitting: Cardiology

## 2017-05-23 ENCOUNTER — Encounter: Payer: Self-pay | Admitting: Cardiology

## 2017-05-23 VITALS — BP 134/70 | HR 64 | Ht 64.0 in | Wt 187.4 lb

## 2017-05-23 DIAGNOSIS — E785 Hyperlipidemia, unspecified: Secondary | ICD-10-CM | POA: Diagnosis not present

## 2017-05-23 DIAGNOSIS — I2510 Atherosclerotic heart disease of native coronary artery without angina pectoris: Secondary | ICD-10-CM

## 2017-05-23 DIAGNOSIS — I10 Essential (primary) hypertension: Secondary | ICD-10-CM | POA: Diagnosis not present

## 2017-05-23 MED ORDER — CLOPIDOGREL BISULFATE 75 MG PO TABS: 75.0000 mg | ORAL_TABLET | Freq: Every day | ORAL | 3 refills | Status: DC

## 2017-05-23 NOTE — Patient Instructions (Addendum)
Quit smoking  We will switch Brilinta to Plavix-- take 300 mg the first day then 75 mg daily  Go ahead and consider Cardiac Rehab  We will check blood work  I will see you in 4 months

## 2017-05-23 NOTE — Telephone Encounter (Signed)
Attempted to call patient in regards to Cardiac Rehab - Lm on vm °

## 2017-05-23 NOTE — Telephone Encounter (Signed)
Patients insurance is active and benefits verified through [facility] - $20.00 co-pay, no deductible, out of pocket amount of $6,700/$0.00 has been met, no co-insurance, and no pre-authorization is required. Passport/reference [id number]  Patient is ready to be contacted and scheduled.

## 2017-05-24 ENCOUNTER — Telehealth: Payer: Self-pay | Admitting: Cardiology

## 2017-05-24 MED ORDER — IRBESARTAN 75 MG PO TABS: 75.0000 mg | ORAL_TABLET | Freq: Every day | ORAL | 4 refills | Status: DC

## 2017-05-24 MED ORDER — METOPROLOL TARTRATE 25 MG PO TABS: 25.0000 mg | ORAL_TABLET | Freq: Two times a day (BID) | ORAL | 4 refills | Status: DC

## 2017-05-24 NOTE — Telephone Encounter (Signed)
New message      *STAT* If patient is at the pharmacy, call can be transferred to refill team.   1. Which medications need to be refilled? (please list name of each medication and dose if known) irbesartan (AVAPRO) 75 MG tablet and metoprolol tartrate (LOPRESSOR) 25 MG tablet  2. Which pharmacy/location (including street and city if local pharmacy) is medication to be sent to?[facility] [place] - [address]  3. Do they need a 30 day or 90 day supply? [place]

## 2017-05-30 ENCOUNTER — Telehealth (HOSPITAL_COMMUNITY): Payer: Self-pay

## 2017-05-30 ENCOUNTER — Encounter (HOSPITAL_COMMUNITY): Payer: Self-pay

## 2017-05-30 NOTE — Telephone Encounter (Signed)
2nd attempt to call patient in regards to cardiac rehab - lm on vm. Sending letter.

## 2017-06-06 ENCOUNTER — Telehealth (HOSPITAL_COMMUNITY): Payer: Self-pay

## 2017-06-06 NOTE — Telephone Encounter (Signed)
3rd attempt to call patient in regards to Cardiac Rehab - Lm on vm

## 2017-06-13 ENCOUNTER — Telehealth (HOSPITAL_COMMUNITY): Payer: Self-pay

## 2017-06-13 NOTE — Telephone Encounter (Signed)
No response from patient - Closed referral.

## 2017-07-08 ENCOUNTER — Telehealth (HOSPITAL_COMMUNITY): Payer: Self-pay

## 2017-07-08 NOTE — Telephone Encounter (Signed)
Patients insurance is active and benefits verified through [facility] - $20.00 co-pay, no deductible, out of pocket amount of $6,700/$35.00 has been met, no co-insurance, and no pre-authorization is required. Passport/reference [id number]

## 2017-08-09 ENCOUNTER — Telehealth: Payer: Self-pay | Admitting: Cardiology

## 2017-08-09 NOTE — Telephone Encounter (Signed)
Returned the call to the patient to inform her that per pharmd, she may try Aleve twice a day for two days. She has verbalized her understanding.

## 2017-08-09 NOTE — Telephone Encounter (Signed)
NEW MESSAGE   Patient calling to confirm what pain medications OTC she can take with heart medications. Please call

## 2017-08-15 ENCOUNTER — Other Ambulatory Visit: Payer: Self-pay | Admitting: Cardiology

## 2017-08-15 NOTE — Telephone Encounter (Signed)
Rx has been sent to the pharmacy electronically. ° °

## 2017-08-19 ENCOUNTER — Other Ambulatory Visit: Payer: Self-pay | Admitting: Cardiology

## 2017-08-19 NOTE — Telephone Encounter (Signed)
REFILL 

## 2017-08-24 ENCOUNTER — Encounter: Payer: Self-pay | Admitting: Cardiology

## 2017-08-29 ENCOUNTER — Telehealth (HOSPITAL_COMMUNITY): Payer: Self-pay

## 2017-08-29 NOTE — Telephone Encounter (Signed)
Cardiac Rehab Medication Review by a Pharmacist  Does the patient feel that his/her medications are working for him/her?  yes  Has the patient been experiencing any side effects to the medications prescribed?  no  Does the patient measure his/her own blood pressure or blood glucose at home?  no  Patient does not have blood pressure cuff at home but is inquiring about whether her insurance will cover one.  Does the patient have any problems obtaining medications due to transportation or finances?   no  Understanding of regimen: excellent Understanding of indications: good Potential of compliance: good  Pharmacist comments: Patient states that she had an episode of plantar fascitis that she was instructed to take two Aleves a day for two days to reduce some inflammation. She said that she feels much better and hasn't repeated a dose of Aleve since.  *Of note, she states that she can not attend her appointment tomorrow, she is remodeling her kitchen and has a contractor coming to her home possibly during that time. She needs someone to call her to reschedule.*   Joee Iovine L. Kyung Rudd, PharmD, [place] PGY1 Pharmacy Resident Pager: [phone]

## 2017-08-30 ENCOUNTER — Ambulatory Visit (HOSPITAL_COMMUNITY): Payer: Medicare Other

## 2017-08-31 ENCOUNTER — Other Ambulatory Visit: Payer: Self-pay | Admitting: Cardiology

## 2017-09-01 NOTE — Telephone Encounter (Signed)
REFILL 

## 2017-09-05 ENCOUNTER — Ambulatory Visit (HOSPITAL_COMMUNITY): Payer: Medicare Other

## 2017-09-06 ENCOUNTER — Ambulatory Visit: Payer: Medicare Other | Admitting: Cardiology

## 2017-09-07 ENCOUNTER — Ambulatory Visit (HOSPITAL_COMMUNITY): Payer: Medicare Other

## 2017-09-09 ENCOUNTER — Ambulatory Visit (HOSPITAL_COMMUNITY): Payer: Medicare Other

## 2017-09-12 ENCOUNTER — Ambulatory Visit (HOSPITAL_COMMUNITY): Payer: Medicare Other

## 2017-09-14 ENCOUNTER — Ambulatory Visit (HOSPITAL_COMMUNITY): Payer: Medicare Other

## 2017-09-16 ENCOUNTER — Ambulatory Visit (HOSPITAL_COMMUNITY): Payer: Medicare Other

## 2017-09-19 ENCOUNTER — Ambulatory Visit (HOSPITAL_COMMUNITY): Payer: Medicare Other

## 2017-09-21 ENCOUNTER — Ambulatory Visit (HOSPITAL_COMMUNITY): Payer: Medicare Other

## 2017-09-23 ENCOUNTER — Ambulatory Visit (HOSPITAL_COMMUNITY): Payer: Medicare Other

## 2017-09-28 ENCOUNTER — Ambulatory Visit (HOSPITAL_COMMUNITY): Payer: Medicare Other

## 2017-09-29 LAB — BASIC METABOLIC PANEL
BUN / CREAT RATIO: 14 (ref 12–28)
BUN: 12 mg/dL (ref 8–27)
CO2: 25 mmol/L (ref 20–29)
CREATININE: 0.83 mg/dL (ref 0.57–1.00)
Calcium: 9 mg/dL (ref 8.7–10.3)
Chloride: 105 mmol/L (ref 96–106)
GFR calc non Af Amer: 73 mL/min/{1.73_m2} (ref >59)
GFR, EST AFRICAN AMERICAN: 84 mL/min/{1.73_m2} (ref >59)
Glucose: 98 mg/dL (ref 65–99)
Potassium: 4.8 mmol/L (ref 3.5–5.2)
SODIUM: 143 mmol/L (ref 134–144)

## 2017-09-29 LAB — LIPID PANEL W/O CHOL/HDL RATIO
CHOLESTEROL TOTAL: 121 mg/dL (ref 100–199)
HDL: 50 mg/dL (ref >39)
LDL CALC: 54 mg/dL (ref 0–99)
Triglycerides: 83 mg/dL (ref 0–149)
VLDL CHOLESTEROL CAL: 17 mg/dL (ref 5–40)

## 2017-09-29 LAB — HEPATIC FUNCTION PANEL
ALBUMIN: 4.2 g/dL (ref 3.6–4.8)
ALT: 22 IU/L (ref 0–32)
AST: 20 IU/L (ref 0–40)
Alkaline Phosphatase: 96 IU/L (ref 39–117)
BILIRUBIN TOTAL: 0.3 mg/dL (ref 0.0–1.2)
Bilirubin, Direct: 0.11 mg/dL (ref 0.00–0.40)
TOTAL PROTEIN: 6.4 g/dL (ref 6.0–8.5)

## 2017-09-30 ENCOUNTER — Ambulatory Visit (HOSPITAL_COMMUNITY): Payer: Medicare Other

## 2017-10-03 ENCOUNTER — Ambulatory Visit (HOSPITAL_COMMUNITY): Payer: Medicare Other

## 2017-10-03 ENCOUNTER — Encounter: Payer: Self-pay | Admitting: Physician Assistant

## 2017-10-03 ENCOUNTER — Ambulatory Visit: Payer: Medicare Other | Admitting: Physician Assistant

## 2017-10-03 VITALS — BP 152/80 | HR 55 | Ht 64.0 in | Wt 190.4 lb

## 2017-10-03 DIAGNOSIS — I2510 Atherosclerotic heart disease of native coronary artery without angina pectoris: Secondary | ICD-10-CM | POA: Diagnosis not present

## 2017-10-03 DIAGNOSIS — E785 Hyperlipidemia, unspecified: Secondary | ICD-10-CM | POA: Diagnosis not present

## 2017-10-03 DIAGNOSIS — Z720 Tobacco use: Secondary | ICD-10-CM | POA: Diagnosis not present

## 2017-10-03 DIAGNOSIS — I10 Essential (primary) hypertension: Secondary | ICD-10-CM | POA: Diagnosis not present

## 2017-10-03 MED ORDER — IRBESARTAN 150 MG PO TABS: 150.0000 mg | ORAL_TABLET | Freq: Every day | ORAL | 1 refills | Status: DC

## 2017-10-03 NOTE — Patient Instructions (Signed)
Medication Instructions:  INCREASE YOUR IRBESARTAN TO 150 MG DAILY TAKE 2 OF YOUR 75 MG TABLETS UNTIL YOU FINISH, [facility]  Labwork: NONE  Testing/Procedures: NONE  Follow-Up: Your physician wants you to follow-up in: DR [name] LATE OCTOBER You will receive a reminder letter in the mail two months in advance. If you don't receive a letter, please call our office to schedule the follow-up appointment.  If you need a refill on your cardiac medications before your next appointment, please call your pharmacy.

## 2017-10-03 NOTE — Progress Notes (Signed)
Cardiology Office Note    Date:  10/03/2017   ID:  [name], DOB 1950/12/27, MRN [id number]  PCP:  Patient, No Pcp Per  Cardiologist:  Dr. [name]  Chief Complaint  Patient presents with  . Follow-up    pt states at times she notices more cramping in leg and feet areas.    History of Present Illness:  Kimberly Huang is a 67 y.o. female with past medical history of hypertension, hyperlipidemia, tobacco abuse and CAD.  Patient presented in October 2018 with chest pain and a posterior STEMI.  She ultimately underwent DES to her left circumflex, she also had residual RCA disease and that was intervened 48 hours later.  Ejection fraction was preserved on LV gram.  Since then, patient has been doing well.  She was last seen by Dr. [name] in January 2019, she denied any chest pain.  However Brilinta was causing roughly $200 per month at the time.  She was switched from Brilinta to Plavix.  Patient presents today for cardiology office visit.  She denies any recent chest pain or shortness of breath.  Her only complaint is some left leg cramping, this only occurs at rest and the seldomly occur with exertion.  In fact, most of the time, walking around actually improved cramping in her leg.  She has good pulses in the left posterior tibial and dorsalis pedis artery, suspicion for PAD very low.  Her blood pressure is slightly elevated today, even I will repeat manual check, her blood pressure was 158/84.  I did increase her irbesartan to 150 mg daily.  Note, her blood pressure was in the 130s back in January 2019.  She did cut back on smoking, she says sometimes she does not smoke for the entire day, other times she was smoke 3 to 4 cigarettes/day.   Past Medical History:  Diagnosis Date  . CAD S/P PCI OM1 99%-0%; Plan Staged PCI mRCA 90%. 02/19/2017   1. Severe 2 vessel obstructive CAD    - 99% thrombotic occlusion of first OM. This is a bifurcating vessel.     - 90% mid RCA-segmental 2. Good  overall LV dysfunction with lateral HK 3. Moderately elevated LVEDP 4. Successful stenting of the first OM with DES. Distal embolization into the distal lateral OM branch.  Plan: DAPT for one year. Will treat with IV Aggrastat for 18 hours. Start high dose statin, beta blocker. IV Ntg for BP control acutely. Plan for stage PCI of RCA on Monday 44/31 if no complication.  . Essential hypertension 02/21/2017  . Hyperlipidemia with target LDL less than 70 02/21/2017  . Presence of drug-eluting stent in L Cx: OM1 (Xience SIERRA DES 3.5 x 15) 02/21/2017  . STEMI involving left circumflex coronary artery ([place]) 02/19/2017  . Tobacco abuse 02/19/2017    Past Surgical History:  Procedure Laterality Date  . CHOLECYSTECTOMY    . CORONARY STENT INTERVENTION N/A 02/19/2017   Procedure: CORONARY STENT INTERVENTION;  Surgeon: [name], Peter M, MD;  Location: [facility];  Service: Cardiovascular;  Laterality: N/A;  . CORONARY STENT INTERVENTION N/A 02/21/2017   Procedure: CORONARY STENT INTERVENTION;  Surgeon: Belva Crome, MD;  Location: [facility];  Service: Cardiovascular;  Laterality: N/A;  . LEFT HEART CATH AND CORONARY ANGIOGRAPHY N/A 02/19/2017   Procedure: LEFT HEART CATH AND CORONARY ANGIOGRAPHY;  Surgeon: [name], Peter M, MD;  Location: [facility];  Service: Cardiovascular;  Laterality: N/A;  . tonsillectomy    .  TUBAL LIGATION      Current Medications: Outpatient Medications Prior to Visit  Medication Sig Dispense Refill  . aspirin 81 MG chewable tablet Chew 1 tablet (81 mg total) by mouth daily.    Marland Kitchen atorvastatin (LIPITOR) 80 MG tablet TAKE 1 TABLET (80 MG TOTAL) BY MOUTH DAILY AT 6 PM. 90 tablet 1  . clopidogrel (PLAVIX) 75 MG tablet Take 1 tablet (75 mg total) by mouth daily. 90 tablet 3  . metoprolol tartrate (LOPRESSOR) 25 MG tablet TAKE 1 TABLET BY MOUTH TWICE A DAY 60 tablet 9  . nitroGLYCERIN (NITROSTAT) 0.4 MG SL tablet Place 1 tablet (0.4 mg total) under the  tongue every 5 (five) minutes x 3 doses as needed for chest pain. 25 tablet 2  . irbesartan (AVAPRO) 75 MG tablet TAKE 1 TABLET BY MOUTH EVERY DAY 90 tablet 1   No facility-administered medications prior to visit.      Allergies:   Codeine   Social History   Socioeconomic History  . Marital status: Married    Spouse name: Not on file  . Number of children: 3  . Years of education: Not on file  . Highest education level: Not on file  Occupational History  . Not on file  Social Needs  . Financial resource strain: Not on file  . Food insecurity:    Worry: Not on file    Inability: Not on file  . Transportation needs:    Medical: Not on file    Non-medical: Not on file  Tobacco Use  . Smoking status: Current Some Day Smoker    Packs/day: 1.00    Types: Cigarettes  . Smokeless tobacco: Never Used  . Tobacco comment: Trying to quit  Substance and Sexual Activity  . Alcohol use: No  . Drug use: Never  . Sexual activity: Not on file  Lifestyle  . Physical activity:    Days per week: Not on file    Minutes per session: Not on file  . Stress: Not on file  Relationships  . Social connections:    Talks on phone: Not on file    Gets together: Not on file    Attends religious service: Not on file    Active member of club or organization: Not on file    Attends meetings of clubs or organizations: Not on file    Relationship status: Not on file  Other Topics Concern  . Not on file  Social History Narrative   Family runs the [employer].     Family History:  The patient's family history includes Diabetes in her mother; Heart attack in her sister.   ROS:   Please see the history of present illness.    ROS All other systems reviewed and are negative.   PHYSICAL EXAM:   VS:  BP (!) 152/80 (BP Location: Left Arm)   Pulse (!) 55   Ht _0  (1.626 m)   Wt 190 lb 6.4 oz (86.4 kg)   BMI 32.68 kg/m    GEN: Well nourished, well developed, in no acute distress  HEENT:  normal  Neck: no JVD, carotid bruits, or masses Cardiac: RRR; no murmurs, rubs, or gallops,no edema  Respiratory:  clear to auscultation bilaterally, normal work of breathing GI: soft, nontender, nondistended, + BS MS: no deformity or atrophy  Skin: warm and dry, no rash Neuro:  Alert and Oriented x 3, Strength and sensation are intact Psych: euthymic mood, full affect  Wt Readings from Last  3 Encounters:  10/03/17 190 lb 6.4 oz (86.4 kg)  05/23/17 187 lb 6 oz (85 kg)  03/03/17 182 lb (82.6 kg)      Studies/Labs Reviewed:   EKG:  EKG is ordered today.  The ekg ordered today demonstrates sinus bradycardia, heart rate 55, otherwise poor R wave progression anterior leads.  Recent Labs: 02/19/2017: Magnesium 2.1; TSH 0.757 02/22/2017: Hemoglobin 12.4; Platelets 192 09/29/2017: ALT 22; BUN 12; Creatinine, Ser 0.83; Potassium 4.8; Sodium 143   Lipid Panel    Component Value Date/Time   CHOL 121 09/29/2017 1138   TRIG 83 09/29/2017 1138   HDL 50 09/29/2017 1138   CHOLHDL 4.0 02/20/2017 0524   VLDL 16 02/20/2017 0524   LDLCALC 54 09/29/2017 1138    Additional studies/ records that were reviewed today include:   Cath 02/19/2017 Conclusion     Prox LAD to Mid LAD lesion, 20 %stenosed.  Prox RCA to Mid RCA lesion, 90 %stenosed.  Prox Cx to Mid Cx lesion, 30 %stenosed.  The left ventricular systolic function is normal.  LV end diastolic pressure is moderately elevated.  The left ventricular ejection fraction is 50-55% by visual estimate.  1st Mrg lesion, 99 %stenosed.  A STENT SIERRA 3.50 X 15 MM drug eluting stent was successfully placed.  Post intervention, there is a 0% residual stenosis.  Lat 1st Mrg lesion, 100 %stenosed.   1. Severe 2 vessel obstructive CAD    - 99% thrombotic occlusion of first OM. This is a bifurcating vessel.     - 90% mid RCA-segmental 2. Good overall LV dysfunction with lateral HK 3. Moderately elevated LVEDP 4. Successful stenting  of the first OM with DES. Distal embolization into the distal lateral OM branch.  Plan: DAPT for one year. Will treat with IV Aggrastat for 18 hours. Start high dose statin, beta blocker. IV Ntg for BP control acutely. Plan for stage PCI of RCA on Monday if no complication.     Cath 02/21/2018 Conclusion    Successful stent implantation in the proximal to mid RCA reducing a segmental 90% stenosis to 0% with TIMI grade 3 flow. Stent used was a 3.0 x 26 Onyx post dilated to 14 atm 2.  RECOMMENDATIONS:   Continue dual antiplatelet therapy (aspirin and Brilinta)  Discharge per discretion of IC team.       ASSESSMENT:    1. Coronary artery disease involving native coronary artery of native heart without angina pectoris   2. Tobacco abuse   3. Essential hypertension   4. Hyperlipidemia, unspecified hyperlipidemia type      PLAN:  In order of problems listed above:  1. CAD: Continue aspirin and Lipitor, denies any recent angina.  Will defer to Dr. [name] to decide if Plavix can be discontinued after February 21, 2018.  2. Hypertension: Blood pressure elevated today, increase irbesartan to 150 mg daily  3. Hyperlipidemia: Cholesterol well controlled on 80 mg daily of Lipitor.  4. Tobacco abuse: Continues to smoke 3 to 4 cigarettes/day.  Advised to stop smoking.    Medication Adjustments/Labs and Tests Ordered: Current medicines are reviewed at length with the patient today.  Concerns regarding medicines are outlined above.  Medication changes, Labs and Tests ordered today are listed in the Patient Instructions below. Patient Instructions  Medication Instructions:  INCREASE YOUR IRBESARTAN TO 150 MG DAILY TAKE 2 OF YOUR 75 MG TABLETS UNTIL YOU FINISH, [facility]  Labwork: NONE  Testing/Procedures: NONE  Follow-Up: Your physician wants you to follow-up in: DR [name] LATE OCTOBER You will receive a reminder letter in the mail two months  in advance. If you don't receive a letter, please call our office to schedule the follow-up appointment.  If you need a refill on your cardiac medications before your next appointment, please call your pharmacy.     Hilbert Corrigan, [place]  10/03/2017 4:43 PM    [facility] [place], [place], [place]  [address] Phone: [phone]; Fax: [phone]

## 2017-10-05 ENCOUNTER — Ambulatory Visit (HOSPITAL_COMMUNITY): Payer: Medicare Other

## 2017-10-07 ENCOUNTER — Ambulatory Visit (HOSPITAL_COMMUNITY): Payer: Medicare Other

## 2017-10-10 ENCOUNTER — Ambulatory Visit (HOSPITAL_COMMUNITY): Payer: Medicare Other

## 2017-10-12 ENCOUNTER — Ambulatory Visit (HOSPITAL_COMMUNITY): Payer: Medicare Other

## 2017-10-14 ENCOUNTER — Ambulatory Visit (HOSPITAL_COMMUNITY): Payer: Medicare Other

## 2017-10-17 ENCOUNTER — Ambulatory Visit (HOSPITAL_COMMUNITY): Payer: Medicare Other

## 2017-10-19 ENCOUNTER — Ambulatory Visit (HOSPITAL_COMMUNITY): Payer: Medicare Other

## 2017-10-21 ENCOUNTER — Ambulatory Visit (HOSPITAL_COMMUNITY): Payer: Medicare Other

## 2017-10-24 ENCOUNTER — Ambulatory Visit (HOSPITAL_COMMUNITY): Payer: Medicare Other

## 2017-10-26 ENCOUNTER — Ambulatory Visit (HOSPITAL_COMMUNITY): Payer: Medicare Other

## 2017-10-28 ENCOUNTER — Telehealth (HOSPITAL_COMMUNITY): Payer: Self-pay

## 2017-10-28 ENCOUNTER — Ambulatory Visit (HOSPITAL_COMMUNITY): Payer: Medicare Other

## 2017-10-28 NOTE — Telephone Encounter (Signed)
Attempted to contact patient to see if she was interested in rescheduling orientation for Cardiac Rehab - lm on vm

## 2017-10-31 ENCOUNTER — Ambulatory Visit (HOSPITAL_COMMUNITY): Payer: Medicare Other

## 2017-11-02 ENCOUNTER — Ambulatory Visit (HOSPITAL_COMMUNITY): Payer: Medicare Other

## 2017-11-04 ENCOUNTER — Ambulatory Visit (HOSPITAL_COMMUNITY): Payer: Medicare Other

## 2017-11-07 ENCOUNTER — Ambulatory Visit (HOSPITAL_COMMUNITY): Payer: Medicare Other

## 2017-11-09 ENCOUNTER — Ambulatory Visit (HOSPITAL_COMMUNITY): Payer: Medicare Other

## 2017-11-11 ENCOUNTER — Ambulatory Visit (HOSPITAL_COMMUNITY): Payer: Medicare Other

## 2017-11-14 ENCOUNTER — Ambulatory Visit (HOSPITAL_COMMUNITY): Payer: Medicare Other

## 2017-11-14 ENCOUNTER — Other Ambulatory Visit: Payer: Self-pay | Admitting: Cardiology

## 2017-11-16 ENCOUNTER — Ambulatory Visit (HOSPITAL_COMMUNITY): Payer: Medicare Other

## 2017-11-18 ENCOUNTER — Ambulatory Visit (HOSPITAL_COMMUNITY): Payer: Medicare Other

## 2017-11-21 ENCOUNTER — Ambulatory Visit (HOSPITAL_COMMUNITY): Payer: Medicare Other

## 2017-11-23 ENCOUNTER — Ambulatory Visit (HOSPITAL_COMMUNITY): Payer: Medicare Other

## 2017-11-25 ENCOUNTER — Ambulatory Visit (HOSPITAL_COMMUNITY): Payer: Medicare Other

## 2017-11-28 ENCOUNTER — Ambulatory Visit (HOSPITAL_COMMUNITY): Payer: Medicare Other

## 2017-11-30 ENCOUNTER — Ambulatory Visit (HOSPITAL_COMMUNITY): Payer: Medicare Other

## 2017-12-02 ENCOUNTER — Ambulatory Visit (HOSPITAL_COMMUNITY): Payer: Medicare Other

## 2017-12-05 ENCOUNTER — Ambulatory Visit (HOSPITAL_COMMUNITY): Payer: Medicare Other

## 2017-12-07 ENCOUNTER — Ambulatory Visit (HOSPITAL_COMMUNITY): Payer: Medicare Other

## 2018-02-15 ENCOUNTER — Other Ambulatory Visit: Payer: Self-pay | Admitting: Cardiology

## 2018-03-03 ENCOUNTER — Telehealth: Payer: Self-pay | Admitting: Cardiology

## 2018-03-03 NOTE — Telephone Encounter (Signed)
Called patient, cancelled appointment and rescheduled for first opening available in Jan with Dr.Jordan.  Patient verbalized understanding.

## 2018-03-03 NOTE — Telephone Encounter (Signed)
New message:      Pt is calling and states she is unable to make this 11/27 appt. She needs an afternoon appt.

## 2018-03-29 ENCOUNTER — Ambulatory Visit: Payer: Medicare Other | Admitting: Cardiology

## 2018-04-18 ENCOUNTER — Other Ambulatory Visit: Payer: Self-pay | Admitting: Physician Assistant

## 2018-04-28 ENCOUNTER — Other Ambulatory Visit: Payer: Self-pay

## 2018-04-28 ENCOUNTER — Telehealth: Payer: Self-pay | Admitting: Cardiology

## 2018-04-28 NOTE — Telephone Encounter (Signed)
 *  STAT* If patient is at the pharmacy, call can be transferred to refill team.   1. Which medications need to be refilled? (please list name of each medication and dose if known) irbesartan (AVAPRO) 150 MG tablet  2. Which pharmacy/location (including street and city if local pharmacy) [facility] [address]  3. Do they need a 30 day or 90 day supply? 90  Patient states that the 150 mg is on backorder and no one has it and she has been out for 2 days. Patient states that [facility] 75 mg and would like to know if she can get a script for the 75 mg to take 2 tablets to make the 150 mg

## 2018-05-01 ENCOUNTER — Other Ambulatory Visit: Payer: Self-pay | Admitting: Cardiology

## 2018-05-01 ENCOUNTER — Telehealth: Payer: Self-pay | Admitting: Cardiology

## 2018-05-01 MED ORDER — IRBESARTAN 150 MG PO TABS: 150.0000 mg | ORAL_TABLET | Freq: Every day | ORAL | 0 refills | Status: DC

## 2018-05-01 MED ORDER — IRBESARTAN 75 MG PO TABS: ORAL_TABLET | ORAL | 6 refills | Status: DC

## 2018-05-01 NOTE — Telephone Encounter (Signed)
New Message:    Pt;s Irbesartan 150 mg is on back order.  She wants to know if she can use the 75mg  2 tablets once a day?

## 2018-05-01 NOTE — Telephone Encounter (Signed)
Spoke to patient she stated her pharmacy out of Irbesartan 150 mg tablets.Stated they need a prescription for 75 mg tablets take 2 tablets daily.Prescription sent to pharmacy.

## 2018-05-16 NOTE — Progress Notes (Signed)
Cardiology Office Note    Date:  05/18/2018   ID:  Kimberly Huang, DOB 02/07/1951, MRN [id number]  PCP:  Patient, No Pcp Per  Cardiologist:  Dr. [name]  Chief Complaint  Patient presents with  . Coronary Artery Disease  . Hypertension    History of Present Illness:  Kimberly Huang is a 68 y.o. female with past medical history of hypertension, hyperlipidemia, tobacco abuse and CAD.  Patient presented in October 2018 with chest pain and a posterior STEMI.  She ultimately underwent DES to her left circumflex, she also had residual RCA disease and that was intervened 48 hours later.  Ejection fraction was preserved on LV gram.   She was switched from Brilinta to Plavix due to cost.  On follow up today she is doing well. No significant angina. Denies any dyspnea, palpitations or edema. Still has occasional leg cramps. Taking CoQ10 for this. She is still smoking a few cigarettes per day but can go a whole day without smoking. She is walking 3x/day. She did have a chest cold and still has a mild nonproductive cough.    Past Medical History:  Diagnosis Date  . CAD S/P PCI OM1 99%-0%; Plan Staged PCI mRCA 90%. 02/19/2017   1. Severe 2 vessel obstructive CAD    - 99% thrombotic occlusion of first OM. This is a bifurcating vessel.     - 90% mid RCA-segmental 2. Good overall LV dysfunction with lateral HK 3. Moderately elevated LVEDP 4. Successful stenting of the first OM with DES. Distal embolization into the distal lateral OM branch.  Plan: DAPT for one year. Will treat with IV Aggrastat for 18 hours. Start high dose statin, beta blocker. IV Ntg for BP control acutely. Plan for stage PCI of RCA on Monday 47/82 if no complication.  . Essential hypertension 02/21/2017  . Hyperlipidemia with target LDL less than 70 02/21/2017  . Presence of drug-eluting stent in L Cx: OM1 (Xience SIERRA DES 3.5 x 15) 02/21/2017  . STEMI involving left circumflex coronary artery ([place]) 02/19/2017  . Tobacco abuse  02/19/2017    Past Surgical History:  Procedure Laterality Date  . CHOLECYSTECTOMY    . CORONARY STENT INTERVENTION N/A 02/19/2017   Procedure: CORONARY STENT INTERVENTION;  Surgeon: [name], Ashiah Karpowicz M, MD;  Location: [facility];  Service: Cardiovascular;  Laterality: N/A;  . CORONARY STENT INTERVENTION N/A 02/21/2017   Procedure: CORONARY STENT INTERVENTION;  Surgeon: Belva Crome, MD;  Location: [facility];  Service: Cardiovascular;  Laterality: N/A;  . LEFT HEART CATH AND CORONARY ANGIOGRAPHY N/A 02/19/2017   Procedure: LEFT HEART CATH AND CORONARY ANGIOGRAPHY;  Surgeon: [name], Shriyan Arakawa M, MD;  Location: [facility];  Service: Cardiovascular;  Laterality: N/A;  . tonsillectomy    . TUBAL LIGATION      Current Medications: Outpatient Medications Prior to Visit  Medication Sig Dispense Refill  . aspirin 81 MG chewable tablet Chew 1 tablet (81 mg total) by mouth daily.    Marland Kitchen atorvastatin (LIPITOR) 80 MG tablet TAKE 1 TABLET (80 MG TOTAL) BY MOUTH DAILY AT 6 PM. 90 tablet 1  . irbesartan (AVAPRO) 75 MG tablet Take 2 tablets ( 150 mg ) daily 60 tablet 6  . metoprolol tartrate (LOPRESSOR) 25 MG tablet TAKE 1 TABLET BY MOUTH TWICE A DAY 60 tablet 9  . nitroGLYCERIN (NITROSTAT) 0.4 MG SL tablet Place 1 tablet (0.4 mg total) under the tongue every 5 (five) minutes x 3 doses  as needed for chest pain. 25 tablet 2  . clopidogrel (PLAVIX) 75 MG tablet Take 1 tablet (75 mg total) by mouth daily. 90 tablet 3   No facility-administered medications prior to visit.      Allergies:   Codeine   Social History   Socioeconomic History  . Marital status: Married    Spouse name: Not on file  . Number of children: 3  . Years of education: Not on file  . Highest education level: Not on file  Occupational History  . Not on file  Social Needs  . Financial resource strain: Not on file  . Food insecurity:    Worry: Not on file    Inability: Not on file  . Transportation needs:      Medical: Not on file    Non-medical: Not on file  Tobacco Use  . Smoking status: Current Some Day Smoker    Packs/day: 1.00    Types: Cigarettes  . Smokeless tobacco: Never Used  . Tobacco comment: Trying to quit  Substance and Sexual Activity  . Alcohol use: No  . Drug use: Never  . Sexual activity: Not on file  Lifestyle  . Physical activity:    Days per week: Not on file    Minutes per session: Not on file  . Stress: Not on file  Relationships  . Social connections:    Talks on phone: Not on file    Gets together: Not on file    Attends religious service: Not on file    Active member of club or organization: Not on file    Attends meetings of clubs or organizations: Not on file    Relationship status: Not on file  Other Topics Concern  . Not on file  Social History Narrative   Family runs the [employer].     Family History:  The patient's family history includes Diabetes in her mother; Heart attack in her sister.   ROS:   Please see the history of present illness.    ROS All other systems reviewed and are negative.   PHYSICAL EXAM:   VS:  BP (!) 151/75   Pulse 62   Ht 5' 3.5" (1.613 m)   Wt 190 lb (86.2 kg)   BMI 33.13 kg/m    GENERAL:  Well appearing WF in NAD HEENT:  PERRL, EOMI, sclera are clear. Oropharynx is clear. NECK:  No jugular venous distention, carotid upstroke brisk and symmetric, no bruits, no thyromegaly or adenopathy LUNGS:  Clear to auscultation bilaterally CHEST:  Unremarkable HEART:  RRR,  PMI not displaced or sustained,S1 and S2 within normal limits, no S3, no S4: no clicks, no rubs, no murmurs ABD:  Soft, nontender. BS +, no masses or bruits. No hepatomegaly, no splenomegaly EXT:  2 + pulses throughout, no edema, no cyanosis no clubbing SKIN:  Warm and dry.  No rashes NEURO:  Alert and oriented x 3. Cranial nerves II through XII intact. PSYCH:  Cognitively intact    Wt Readings from Last 3 Encounters:  05/18/18 190 lb (86.2  kg)  10/03/17 190 lb 6.4 oz (86.4 kg)  05/23/17 187 lb 6 oz (85 kg)      Studies/Labs Reviewed:   EKG:  EKG is not ordered today.    Recent Labs: 09/29/2017: ALT 22; BUN 12; Creatinine, Ser 0.83; Potassium 4.8; Sodium 143   Lipid Panel    Component Value Date/Time   CHOL 121 09/29/2017 1138   TRIG 83 09/29/2017 1138  HDL 50 09/29/2017 1138   CHOLHDL 4.0 02/20/2017 0524   VLDL 16 02/20/2017 0524   LDLCALC 54 09/29/2017 1138    Additional studies/ records that were reviewed today include:   Cath 02/19/2017 Conclusion     Prox LAD to Mid LAD lesion, 20 %stenosed.  Prox RCA to Mid RCA lesion, 90 %stenosed.  Prox Cx to Mid Cx lesion, 30 %stenosed.  The left ventricular systolic function is normal.  LV end diastolic pressure is moderately elevated.  The left ventricular ejection fraction is 50-55% by visual estimate.  1st Mrg lesion, 99 %stenosed.  A STENT SIERRA 3.50 X 15 MM drug eluting stent was successfully placed.  Post intervention, there is a 0% residual stenosis.  Lat 1st Mrg lesion, 100 %stenosed.   1. Severe 2 vessel obstructive CAD    - 99% thrombotic occlusion of first OM. This is a bifurcating vessel.     - 90% mid RCA-segmental 2. Good overall LV dysfunction with lateral HK 3. Moderately elevated LVEDP 4. Successful stenting of the first OM with DES. Distal embolization into the distal lateral OM branch.  Plan: DAPT for one year. Will treat with IV Aggrastat for 18 hours. Start high dose statin, beta blocker. IV Ntg for BP control acutely. Plan for stage PCI of RCA on Monday if no complication.     Cath 02/21/2017 Conclusion    Successful stent implantation in the proximal to mid RCA reducing a segmental 90% stenosis to 0% with TIMI grade 3 flow. Stent used was a 3.0 x 26 Onyx post dilated to 14 atm 2.  RECOMMENDATIONS:   Continue dual antiplatelet therapy (aspirin and Brilinta)  Discharge per discretion of IC team.        ASSESSMENT:    1. Coronary artery disease involving native coronary artery of native heart without angina pectoris   2. Essential hypertension   3. Hyperlipidemia, unspecified hyperlipidemia type      PLAN:  In order of problems listed above:  1. CAD: s/p STEMI in October 2018 with DES the LCx and RCA. She is asymptomatic. Continue ASA, lipitor, beta blocker. Will discontinue Plavix now.    2. Hypertension: Blood pressure elevated today. I get 146/70. I have asked her to monitor her BP at home. Let us know if staying over 140. If so we will increase irbesartan to 300 mg daily.   3. Hyperlipidemia: Cholesterol well controlled on 80 mg daily of Lipitor. Will repeat fasting lab in 6 months.  4. Tobacco abuse: Continues to smoke 3 to 4 cigarettes/day.  Counseled on complete cessation.     Medication Adjustments/Labs and Tests Ordered: Current medicines are reviewed at length with the patient today.  Concerns regarding medicines are outlined above.  Medication changes, Labs and Tests ordered today are listed in the Patient Instructions below. Patient Instructions  Stop taking Plavix  Monitor blood pressure at home. Let me know if still greater than 140  Quit smoking       Signed, Dilia Alemany [name], MD  05/18/2018 4:15 PM    [facility] [address], [place], [place]  [address] Phone: [phone]; Fax: [phone]

## 2018-05-18 ENCOUNTER — Ambulatory Visit: Payer: Medicare Other | Admitting: Cardiology

## 2018-05-18 ENCOUNTER — Encounter: Payer: Self-pay | Admitting: Cardiology

## 2018-05-18 VITALS — BP 151/75 | HR 62 | Ht 63.5 in | Wt 190.0 lb

## 2018-05-18 DIAGNOSIS — I2510 Atherosclerotic heart disease of native coronary artery without angina pectoris: Secondary | ICD-10-CM | POA: Diagnosis not present

## 2018-05-18 DIAGNOSIS — I10 Essential (primary) hypertension: Secondary | ICD-10-CM | POA: Diagnosis not present

## 2018-05-18 DIAGNOSIS — E785 Hyperlipidemia, unspecified: Secondary | ICD-10-CM

## 2018-05-18 NOTE — Patient Instructions (Addendum)
Stop taking Plavix  Monitor blood pressure at home. Let me know if still greater than 140  Quit smoking

## 2018-05-19 ENCOUNTER — Other Ambulatory Visit: Payer: Self-pay | Admitting: Cardiology

## 2018-05-20 ENCOUNTER — Other Ambulatory Visit: Payer: Self-pay | Admitting: Cardiology

## 2018-05-26 NOTE — Telephone Encounter (Signed)
F/U       Patient is calling back to get clarification on her Plavix. Patient would like a call back from Ms. Cheryl on Monday

## 2018-05-26 NOTE — Telephone Encounter (Signed)
Please advise if this is something she could do with her Plavix?   Thanks!

## 2018-05-29 ENCOUNTER — Telehealth: Payer: Self-pay | Admitting: Cardiology

## 2018-05-29 MED ORDER — NITROGLYCERIN 0.4 MG SL SUBL: 0.4000 mg | SUBLINGUAL_TABLET | SUBLINGUAL | 2 refills | Status: DC | PRN

## 2018-05-29 NOTE — Telephone Encounter (Signed)
New Message   Patient returning Kimberly Huang's phone call about being taken off Plavix.

## 2018-05-29 NOTE — Telephone Encounter (Signed)
She was told to discontinue plavix when she saw Dr. [name] last week.  What type of clarification is she looking for?

## 2018-05-29 NOTE — Telephone Encounter (Signed)
Called patient, LVM to report that Dr.Jordan took her off her Plavix at last OV. Gave call back number.

## 2018-05-29 NOTE — Telephone Encounter (Signed)
Called patient and advised of last OV to stop plavix per Dr.Jordan. Patient verbalized understanding, med list updated.

## 2018-07-20 ENCOUNTER — Other Ambulatory Visit: Payer: Self-pay | Admitting: *Deleted

## 2018-07-20 MED ORDER — ATORVASTATIN CALCIUM 80 MG PO TABS: 80.0000 mg | ORAL_TABLET | Freq: Every day | ORAL | 1 refills | Status: DC

## 2018-11-14 IMAGING — DX DG CHEST 1V PORT
1 series · 1 of 1 positions shown · non-contrast
Comparison: None.

CLINICAL DATA: Chest pain, recent cardiac catheterization

EXAM:
PORTABLE CHEST 1 VIEW

[chest ap]
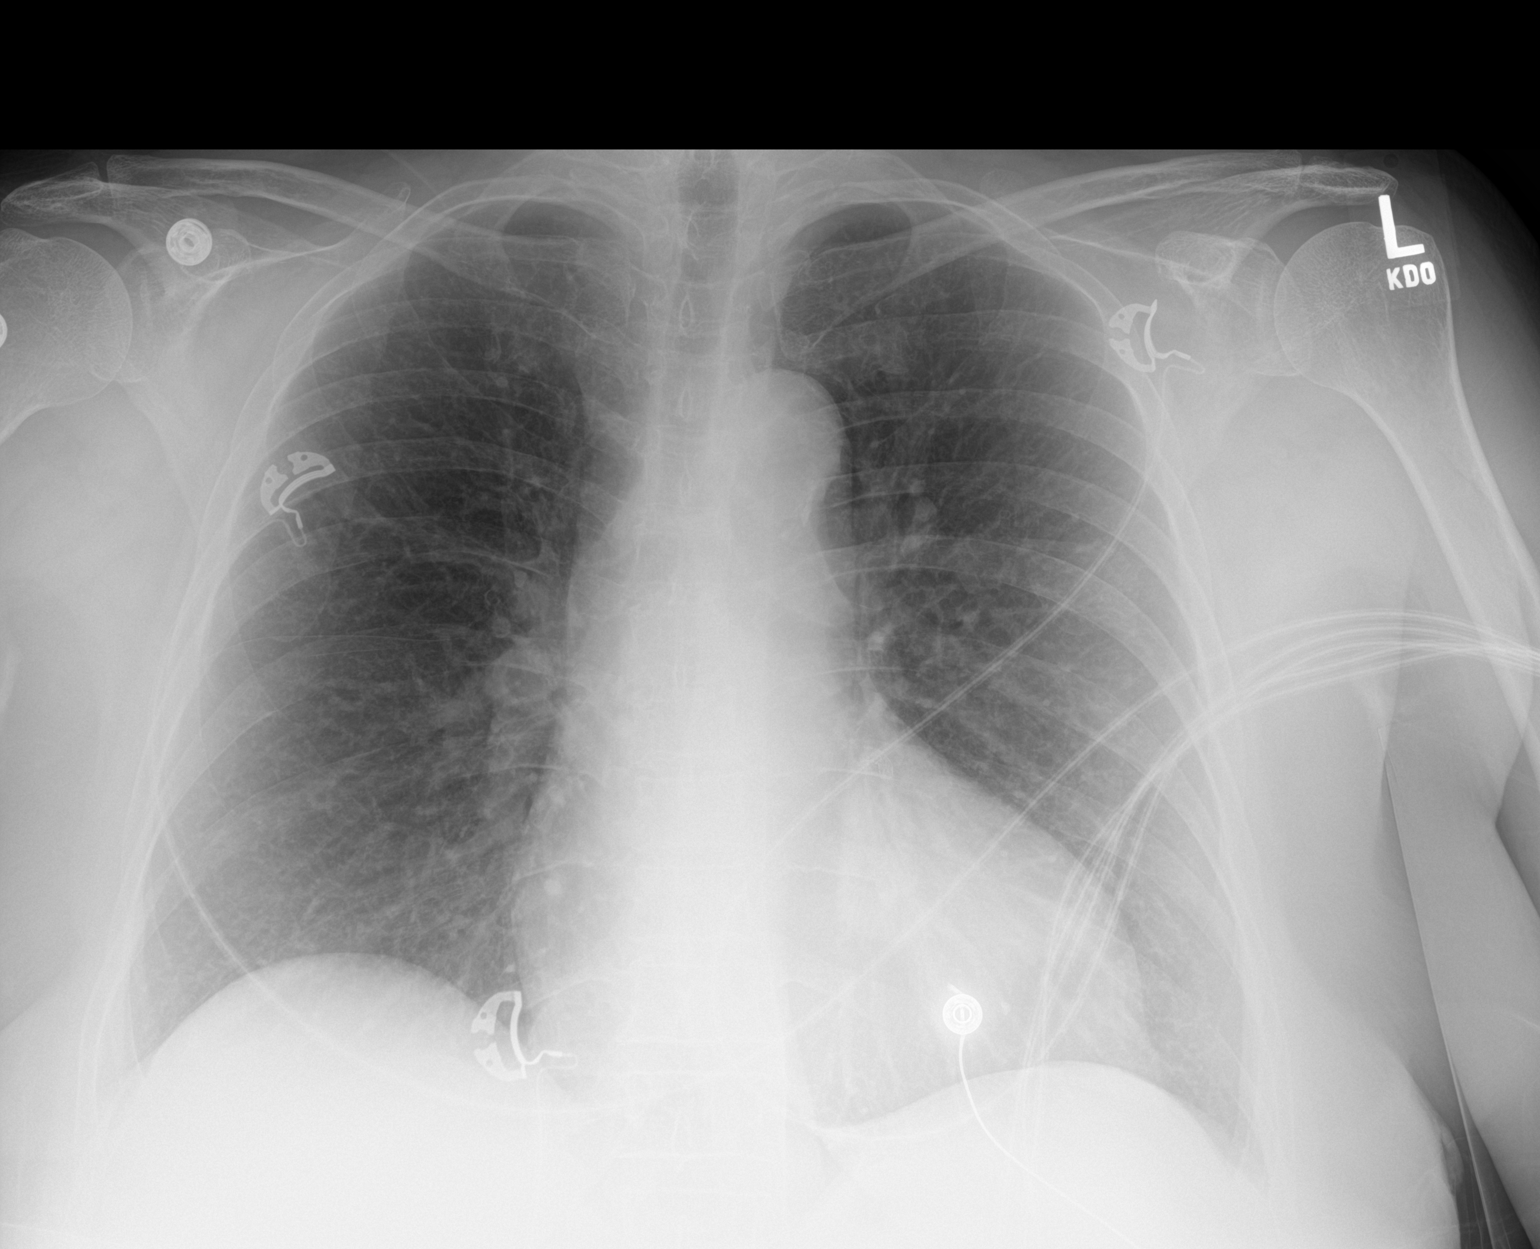

[1 of 1 positions shown; findings below may reference images not displayed]

FINDINGS: The heart size and mediastinal contours are within normal limits.
Both lungs are clear. The visualized skeletal structures are
unremarkable.
IMPRESSION: No active disease.

## 2019-01-03 ENCOUNTER — Other Ambulatory Visit: Payer: Self-pay | Admitting: Cardiology

## 2019-01-07 ENCOUNTER — Other Ambulatory Visit: Payer: Self-pay | Admitting: Cardiology

## 2019-02-14 NOTE — Progress Notes (Signed)
Cardiology Office Note    Date:  02/20/2019   ID:  Kimberly Huang, DOB 1950/12/02, MRN [id number]  PCP:  Patient, No Pcp Per  Cardiologist:  Dr. [name]  Chief Complaint  Patient presents with  . Coronary Artery Disease    History of Present Illness:  Kimberly Huang is a 68 y.o. female with past medical history of hypertension, hyperlipidemia, tobacco abuse and CAD.  Patient presented in October 2018 with chest pain and a posterior STEMI.  She ultimately underwent DES to her left circumflex, she also had residual RCA disease and that was intervened 48 hours later.  Ejection fraction was preserved on LV gram.    On follow up today she is doing well. No significant chest pain.  Denies any dyspnea, palpitations or edema.  She is still smoking off and on. Quit for a month but then resumed.  She is walking 3x/day. Doesn't really follow her BP.  Past Medical History:  Diagnosis Date  . CAD S/P PCI OM1 99%-0%; Plan Staged PCI mRCA 90%. 02/19/2017   1. Severe 2 vessel obstructive CAD    - 99% thrombotic occlusion of first OM. This is a bifurcating vessel.     - 90% mid RCA-segmental 2. Good overall LV dysfunction with lateral HK 3. Moderately elevated LVEDP 4. Successful stenting of the first OM with DES. Distal embolization into the distal lateral OM branch.  Plan: DAPT for one year. Will treat with IV Aggrastat for 18 hours. Start high dose statin, beta blocker. IV Ntg for BP control acutely. Plan for stage PCI of RCA on Monday 74/82 if no complication.  . Essential hypertension 02/21/2017  . Hyperlipidemia with target LDL less than 70 02/21/2017  . Presence of drug-eluting stent in L Cx: OM1 (Xience SIERRA DES 3.5 x 15) 02/21/2017  . STEMI involving left circumflex coronary artery ([place]) 02/19/2017  . Tobacco abuse 02/19/2017    Past Surgical History:  Procedure Laterality Date  . CHOLECYSTECTOMY    . CORONARY STENT INTERVENTION N/A 02/19/2017   Procedure: CORONARY STENT  INTERVENTION;  Surgeon: [name], Sheryl Saintil M, MD;  Location: [facility];  Service: Cardiovascular;  Laterality: N/A;  . CORONARY STENT INTERVENTION N/A 02/21/2017   Procedure: CORONARY STENT INTERVENTION;  Surgeon: Belva Crome, MD;  Location: [facility];  Service: Cardiovascular;  Laterality: N/A;  . LEFT HEART CATH AND CORONARY ANGIOGRAPHY N/A 02/19/2017   Procedure: LEFT HEART CATH AND CORONARY ANGIOGRAPHY;  Surgeon: [name], Briona Korpela M, MD;  Location: [facility];  Service: Cardiovascular;  Laterality: N/A;  . tonsillectomy    . TUBAL LIGATION      Current Medications: Outpatient Medications Prior to Visit  Medication Sig Dispense Refill  . aspirin 81 MG chewable tablet Chew 1 tablet (81 mg total) by mouth daily.    Marland Kitchen atorvastatin (LIPITOR) 80 MG tablet TAKE 1 TABLET (80 MG TOTAL) BY MOUTH DAILY AT 6 PM. 90 tablet 1  . irbesartan (AVAPRO) 150 MG tablet TAKE 1 TABLET BY MOUTH EVERY DAY 90 tablet 0  . metoprolol tartrate (LOPRESSOR) 25 MG tablet TAKE 1 TABLET BY MOUTH TWICE A DAY 180 tablet 3  . nitroGLYCERIN (NITROSTAT) 0.4 MG SL tablet Place 1 tablet (0.4 mg total) under the tongue every 5 (five) minutes x 3 doses as needed for chest pain. 25 tablet 2  . irbesartan (AVAPRO) 75 MG tablet Take 2 tablets ( 150 mg ) daily 60 tablet 6   No facility-administered medications prior  to visit.      Allergies:   Codeine   Social History   Socioeconomic History  . Marital status: Married    Spouse name: Not on file  . Number of children: 3  . Years of education: Not on file  . Highest education level: Not on file  Occupational History  . Not on file  Social Needs  . Financial resource strain: Not on file  . Food insecurity    Worry: Not on file    Inability: Not on file  . Transportation needs    Medical: Not on file    Non-medical: Not on file  Tobacco Use  . Smoking status: Current Some Day Smoker    Packs/day: 1.00    Types: Cigarettes  . Smokeless tobacco:  Never Used  . Tobacco comment: Trying to quit  Substance and Sexual Activity  . Alcohol use: No  . Drug use: Never  . Sexual activity: Not on file  Lifestyle  . Physical activity    Days per week: Not on file    Minutes per session: Not on file  . Stress: Not on file  Relationships  . Social Herbalist on phone: Not on file    Gets together: Not on file    Attends religious service: Not on file    Active member of club or organization: Not on file    Attends meetings of clubs or organizations: Not on file    Relationship status: Not on file  Other Topics Concern  . Not on file  Social History Narrative   Family runs the [employer].     Family History:  The patient's family history includes Diabetes in her mother; Heart attack in her sister.   ROS:   Please see the history of present illness.    ROS All other systems reviewed and are negative.   PHYSICAL EXAM:   VS:  BP (!) 148/78   Pulse 71   Ht 5' 3.5" (1.613 m)   Wt 194 lb 6.4 oz (88.2 kg)   BMI 33.90 kg/m    GENERAL:  Well appearing WF in NAD HEENT:  PERRL, EOMI, sclera are clear. Oropharynx is clear. NECK:  No jugular venous distention, carotid upstroke brisk and symmetric, no bruits, no thyromegaly or adenopathy LUNGS:  Clear to auscultation bilaterally CHEST:  Unremarkable HEART:  RRR,  PMI not displaced or sustained,S1 and S2 within normal limits, no S3, no S4: no clicks, no rubs, no murmurs ABD:  Soft, nontender. BS +, no masses or bruits. No hepatomegaly, no splenomegaly EXT:  2 + pulses throughout, no edema, no cyanosis no clubbing SKIN:  Warm and dry.  No rashes NEURO:  Alert and oriented x 3. Cranial nerves II through XII intact. PSYCH:  Cognitively intact    Wt Readings from Last 3 Encounters:  02/20/19 194 lb 6.4 oz (88.2 kg)  05/18/18 190 lb (86.2 kg)  10/03/17 190 lb 6.4 oz (86.4 kg)      Studies/Labs Reviewed:   EKG:  EKG is ordered today.  NSR with LVH. LAFB. Poor R wave  progression. I have personally reviewed and interpreted this study.   Recent Labs: 02/16/2019: ALT 21; BUN 11; Creatinine, Ser 0.75; Potassium 4.4; Sodium 141   Lipid Panel    Component Value Date/Time   CHOL 144 02/16/2019 1045   TRIG 139 02/16/2019 1045   HDL 48 02/16/2019 1045   CHOLHDL 3.0 02/16/2019 1045   CHOLHDL 4.0 02/20/2017  0524   VLDL 16 02/20/2017 0524   LDLCALC 72 02/16/2019 1045    Additional studies/ records that were reviewed today include:   Cath 02/19/2017 Conclusion     Prox LAD to Mid LAD lesion, 20 %stenosed.  Prox RCA to Mid RCA lesion, 90 %stenosed.  Prox Cx to Mid Cx lesion, 30 %stenosed.  The left ventricular systolic function is normal.  LV end diastolic pressure is moderately elevated.  The left ventricular ejection fraction is 50-55% by visual estimate.  1st Mrg lesion, 99 %stenosed.  A STENT SIERRA 3.50 X 15 MM drug eluting stent was successfully placed.  Post intervention, there is a 0% residual stenosis.  Lat 1st Mrg lesion, 100 %stenosed.   1. Severe 2 vessel obstructive CAD    - 99% thrombotic occlusion of first OM. This is a bifurcating vessel.     - 90% mid RCA-segmental 2. Good overall LV dysfunction with lateral HK 3. Moderately elevated LVEDP 4. Successful stenting of the first OM with DES. Distal embolization into the distal lateral OM branch.  Plan: DAPT for one year. Will treat with IV Aggrastat for 18 hours. Start high dose statin, beta blocker. IV Ntg for BP control acutely. Plan for stage PCI of RCA on Monday if no complication.     Cath 02/21/2017 Conclusion    Successful stent implantation in the proximal to mid RCA reducing a segmental 90% stenosis to 0% with TIMI grade 3 flow. Stent used was a 3.0 x 26 Onyx post dilated to 14 atm 2.  RECOMMENDATIONS:   Continue dual antiplatelet therapy (aspirin and Brilinta)  Discharge per discretion of IC team.       ASSESSMENT:    1. Coronary artery  disease involving native coronary artery of native heart without angina pectoris   2. Essential hypertension   3. Hyperlipidemia, unspecified hyperlipidemia type   4. Tobacco abuse      PLAN:  In order of problems listed above:  1. CAD: s/p STEMI in October 2018 with DES the LCx and RCA. She is asymptomatic. Continue ASA, lipitor, beta blocker.   2. Hypertension: Blood pressure is not at goal. Will increase irbesartan to 300 mg daily.   3. Hyperlipidemia: Cholesterol well controlled on 80 mg daily of Lipitor.   4. Tobacco abuse:  Counseled on complete cessation.     Medication Adjustments/Labs and Tests Ordered: Current medicines are reviewed at length with the patient today.  Concerns regarding medicines are outlined above.  Medication changes, Labs and Tests ordered today are listed in the Patient Instructions below. Patient Instructions  Increase irbesartan to 300 mg daily  Continue your other therapy  You need to quit smoking  Try and lose some weight      Signed, Kyleeann Cremeans [name], MD  02/20/2019 4:58 PM    [facility] [address], [place], [place]  [address] Phone: [phone]; Fax: [phone]

## 2019-02-16 LAB — LIPID PANEL
Chol/HDL Ratio: 3 ratio (ref 0.0–4.4)
Cholesterol, Total: 144 mg/dL (ref 100–199)
HDL: 48 mg/dL (ref >39)
LDL Chol Calc (NIH): 72 mg/dL (ref 0–99)
Triglycerides: 139 mg/dL (ref 0–149)
VLDL Cholesterol Cal: 24 mg/dL (ref 5–40)

## 2019-02-16 LAB — HEPATIC FUNCTION PANEL
ALT: 21 IU/L (ref 0–32)
AST: 19 IU/L (ref 0–40)
Albumin: 4.2 g/dL (ref 3.8–4.8)
Alkaline Phosphatase: 113 IU/L (ref 39–117)
Bilirubin Total: 0.2 mg/dL (ref 0.0–1.2)
Bilirubin, Direct: 0.07 mg/dL (ref 0.00–0.40)
Total Protein: 6.4 g/dL (ref 6.0–8.5)

## 2019-02-16 LAB — BASIC METABOLIC PANEL
BUN/Creatinine Ratio: 15 (ref 12–28)
BUN: 11 mg/dL (ref 8–27)
CO2: 23 mmol/L (ref 20–29)
Calcium: 9.3 mg/dL (ref 8.7–10.3)
Chloride: 104 mmol/L (ref 96–106)
Creatinine, Ser: 0.75 mg/dL (ref 0.57–1.00)
GFR calc Af Amer: 93 mL/min/{1.73_m2} (ref >59)
GFR calc non Af Amer: 81 mL/min/{1.73_m2} (ref >59)
Glucose: 113 mg/dL — ABNORMAL HIGH (ref 65–99)
Potassium: 4.4 mmol/L (ref 3.5–5.2)
Sodium: 141 mmol/L (ref 134–144)

## 2019-02-20 ENCOUNTER — Ambulatory Visit: Payer: Medicare Other | Admitting: Cardiology

## 2019-02-20 ENCOUNTER — Other Ambulatory Visit: Payer: Self-pay

## 2019-02-20 ENCOUNTER — Encounter: Payer: Self-pay | Admitting: Cardiology

## 2019-02-20 VITALS — BP 148/78 | HR 71 | Ht 63.5 in | Wt 194.4 lb

## 2019-02-20 DIAGNOSIS — I2510 Atherosclerotic heart disease of native coronary artery without angina pectoris: Secondary | ICD-10-CM | POA: Diagnosis not present

## 2019-02-20 DIAGNOSIS — I10 Essential (primary) hypertension: Secondary | ICD-10-CM | POA: Diagnosis not present

## 2019-02-20 DIAGNOSIS — E785 Hyperlipidemia, unspecified: Secondary | ICD-10-CM | POA: Diagnosis not present

## 2019-02-20 DIAGNOSIS — Z720 Tobacco use: Secondary | ICD-10-CM

## 2019-02-20 MED ORDER — IRBESARTAN 300 MG PO TABS: 300.0000 mg | ORAL_TABLET | Freq: Every day | ORAL | 3 refills | Status: DC

## 2019-02-20 NOTE — Patient Instructions (Signed)
Increase irbesartan to 300 mg daily  Continue your other therapy  You need to quit smoking  Try and lose some weight

## 2019-03-26 ENCOUNTER — Other Ambulatory Visit: Payer: Self-pay | Admitting: Cardiology

## 2019-03-27 NOTE — Telephone Encounter (Signed)
Rx request sent to pharmacy.  

## 2019-06-23 ENCOUNTER — Ambulatory Visit: Payer: Medicare Other | Attending: Internal Medicine

## 2019-06-23 DIAGNOSIS — Z23 Encounter for immunization: Secondary | ICD-10-CM | POA: Insufficient documentation

## 2019-06-23 NOTE — Progress Notes (Signed)
   Covid-19 Vaccination Clinic  Name:  Kimberly Huang    MRN: [id number] DOB: 1951-03-01  06/23/2019  [name] was observed post Covid-19 immunization for 15 minutes without incidence. She was provided with Vaccine Information Sheet and instruction to access the V-Safe system.   [name] was instructed to call 911 with any severe reactions post vaccine: Marland Kitchen Difficulty breathing  . Swelling of your face and throat  . A fast heartbeat  . A bad rash all over your body  . Dizziness and weakness    Immunizations Administered    Name Date Dose VIS Date Route   Pfizer COVID-19 Vaccine 06/23/2019  3:44 PM 0.3 mL 04/13/2019 Intramuscular   Manufacturer: [place]   Lot: [id number]   [place]: [id number]

## 2019-07-12 ENCOUNTER — Other Ambulatory Visit: Payer: Self-pay | Admitting: Cardiology

## 2019-07-16 ENCOUNTER — Ambulatory Visit: Payer: Medicare Other | Attending: Internal Medicine

## 2019-07-16 DIAGNOSIS — Z23 Encounter for immunization: Secondary | ICD-10-CM

## 2019-07-16 NOTE — Progress Notes (Signed)
   Covid-19 Vaccination Clinic  Name:  Kimberly Huang    MRN: [id number] DOB: 01-05-1951  07/16/2019  [name] was observed post Covid-19 immunization for 15 minutes without incident. She was provided with Vaccine Information Sheet and instruction to access the V-Safe system.   [name] was instructed to call 911 with any severe reactions post vaccine: Marland Kitchen Difficulty breathing  . Swelling of face and throat  . A fast heartbeat  . A bad rash all over body  . Dizziness and weakness   Immunizations Administered    Name Date Dose VIS Date Route   Pfizer COVID-19 Vaccine 07/16/2019  3:56 PM 0.3 mL 04/13/2019 Intramuscular   Manufacturer: [place]   Lot: [id number]   [place]: [id number]

## 2019-08-20 NOTE — Progress Notes (Deleted)
Cardiology Office Note    Date:  08/20/2019   ID:  Kimberly Huang, DOB January 09, 1951, MRN [id number]  PCP:  Kimberly Huang, No Pcp Per  Cardiologist:  Dr. [name]  No chief complaint on file.   History of Present Illness:  Kimberly Huang is a 69 y.o. female with past medical history of hypertension, hyperlipidemia, tobacco abuse and CAD.  Kimberly Huang presented in October 2018 with chest pain and a posterior STEMI.  She ultimately underwent DES to her left circumflex, she also had residual RCA disease and that was intervened 48 hours later.  Ejection fraction was preserved on LV gram.    Her husband presented recently with CHF and Afib. Found to have severe AS, low EF and large thoracic aneurysm and is scheduled to undergo surgery.   On follow up today she is doing well. No significant chest pain.  Denies any dyspnea, palpitations or edema.  She is still smoking off and on. Quit for a month but then resumed.  She is walking 3x/day. Doesn't really follow her BP.  Past Medical History:  Diagnosis Date  . CAD S/P PCI OM1 99%-0%; Plan Staged PCI mRCA 90%. 02/19/2017   1. Severe 2 vessel obstructive CAD    - 99% thrombotic occlusion of first OM. This is a bifurcating vessel.     - 90% mid RCA-segmental 2. Good overall LV dysfunction with lateral HK 3. Moderately elevated LVEDP 4. Successful stenting of the first OM with DES. Distal embolization into the distal lateral OM branch.  Plan: DAPT for one year. Will treat with IV Aggrastat for 18 hours. Start high dose statin, beta blocker. IV Ntg for BP control acutely. Plan for stage PCI of RCA on Monday 02/77 if no complication.  . Essential hypertension 02/21/2017  . Hyperlipidemia with target LDL less than 70 02/21/2017  . Presence of drug-eluting stent in L Cx: OM1 (Xience SIERRA DES 3.5 x 15) 02/21/2017  . STEMI involving left circumflex coronary artery ([place]) 02/19/2017  . Tobacco abuse 02/19/2017    Past Surgical History:  Procedure Laterality Date    . CHOLECYSTECTOMY    . CORONARY STENT INTERVENTION N/A 02/19/2017   Procedure: CORONARY STENT INTERVENTION;  Surgeon: [name], Crandall Harvel M, MD;  Location: [facility];  Service: Cardiovascular;  Laterality: N/A;  . CORONARY STENT INTERVENTION N/A 02/21/2017   Procedure: CORONARY STENT INTERVENTION;  Surgeon: Belva Crome, MD;  Location: [facility];  Service: Cardiovascular;  Laterality: N/A;  . LEFT HEART CATH AND CORONARY ANGIOGRAPHY N/A 02/19/2017   Procedure: LEFT HEART CATH AND CORONARY ANGIOGRAPHY;  Surgeon: [name], Anglia Blakley M, MD;  Location: [facility];  Service: Cardiovascular;  Laterality: N/A;  . tonsillectomy    . TUBAL LIGATION      Current Medications: Outpatient Medications Prior to Visit  Medication Sig Dispense Refill  . aspirin 81 MG chewable tablet Chew 1 tablet (81 mg total) by mouth daily.    Marland Kitchen atorvastatin (LIPITOR) 80 MG tablet TAKE 1 TABLET (80 MG TOTAL) BY MOUTH DAILY AT 6 PM. 90 tablet 1  . irbesartan (AVAPRO) 300 MG tablet Take 1 tablet (300 mg total) by mouth daily. 90 tablet 3  . metoprolol tartrate (LOPRESSOR) 25 MG tablet TAKE 1 TABLET BY MOUTH TWICE A DAY 180 tablet 3  . nitroGLYCERIN (NITROSTAT) 0.4 MG SL tablet Place 1 tablet (0.4 mg total) under the tongue every 5 (five) minutes x 3 doses as needed for chest pain. 25 tablet 2  No facility-administered medications prior to visit.     Allergies:   Codeine   Social History   Socioeconomic History  . Marital status: Married    Spouse name: Not on file  . Number of children: 3  . Years of education: Not on file  . Highest education level: Not on file  Occupational History  . Not on file  Tobacco Use  . Smoking status: Current Some Day Smoker    Packs/day: 1.00    Types: Cigarettes  . Smokeless tobacco: Never Used  . Tobacco comment: Trying to quit  Substance and Sexual Activity  . Alcohol use: No  . Drug use: Never  . Sexual activity: Not on file  Other Topics Concern  .  Not on file  Social History Narrative   Family runs the [employer].   Social Determinants of Health   Financial Resource Strain:   . Difficulty of Paying Living Expenses:   Food Insecurity:   . Worried About Charity fundraiser in the Last Year:   . Arboriculturist in the Last Year:   Transportation Needs:   . Film/video editor (Medical):   Marland Kitchen Lack of Transportation (Non-Medical):   Physical Activity:   . Days of Exercise per Week:   . Minutes of Exercise per Session:   Stress:   . Feeling of Stress :   Social Connections:   . Frequency of Communication with Friends and Family:   . Frequency of Social Gatherings with Friends and Family:   . Attends Religious Services:   . Active Member of Clubs or Organizations:   . Attends Archivist Meetings:   Marland Kitchen Marital Status:      Family History:  The Kimberly Huang's family history includes Diabetes in her mother; Heart attack in her sister.   ROS:   Please see the history of present illness.    ROS All other systems reviewed and are negative.   PHYSICAL EXAM:   VS:  There were no vitals taken for this visit.   GENERAL:  Well appearing WF in NAD HEENT:  PERRL, EOMI, sclera are clear. Oropharynx is clear. NECK:  No jugular venous distention, carotid upstroke brisk and symmetric, no bruits, no thyromegaly or adenopathy LUNGS:  Clear to auscultation bilaterally CHEST:  Unremarkable HEART:  RRR,  PMI not displaced or sustained,S1 and S2 within normal limits, no S3, no S4: no clicks, no rubs, no murmurs ABD:  Soft, nontender. BS +, no masses or bruits. No hepatomegaly, no splenomegaly EXT:  2 + pulses throughout, no edema, no cyanosis no clubbing SKIN:  Warm and dry.  No rashes NEURO:  Alert and oriented x 3. Cranial nerves II through XII intact. PSYCH:  Cognitively intact    Wt Readings from Last 3 Encounters:  02/20/19 194 lb 6.4 oz (88.2 kg)  05/18/18 190 lb (86.2 kg)  10/03/17 190 lb 6.4 oz (86.4 kg)       Studies/Labs Reviewed:   EKG:  EKG is ordered today.  NSR with LVH. LAFB. Poor R wave progression. I have personally reviewed and interpreted this study.   Recent Labs: 02/16/2019: ALT 21; BUN 11; Creatinine, Ser 0.75; Potassium 4.4; Sodium 141   Lipid Panel    Component Value Date/Time   CHOL 144 02/16/2019 1045   TRIG 139 02/16/2019 1045   HDL 48 02/16/2019 1045   CHOLHDL 3.0 02/16/2019 1045   CHOLHDL 4.0 02/20/2017 0524   VLDL 16 02/20/2017 0524   LDLCALC  72 02/16/2019 1045    Additional studies/ records that were reviewed today include:   Cath 02/19/2017 Conclusion     Prox LAD to Mid LAD lesion, 20 %stenosed.  Prox RCA to Mid RCA lesion, 90 %stenosed.  Prox Cx to Mid Cx lesion, 30 %stenosed.  The left ventricular systolic function is normal.  LV end diastolic pressure is moderately elevated.  The left ventricular ejection fraction is 50-55% by visual estimate.  1st Mrg lesion, 99 %stenosed.  A STENT SIERRA 3.50 X 15 MM drug eluting stent was successfully placed.  Post intervention, there is a 0% residual stenosis.  Lat 1st Mrg lesion, 100 %stenosed.   1. Severe 2 vessel obstructive CAD    - 99% thrombotic occlusion of first OM. This is a bifurcating vessel.     - 90% mid RCA-segmental 2. Good overall LV dysfunction with lateral HK 3. Moderately elevated LVEDP 4. Successful stenting of the first OM with DES. Distal embolization into the distal lateral OM branch.  Plan: DAPT for one year. Will treat with IV Aggrastat for 18 hours. Start high dose statin, beta blocker. IV Ntg for BP control acutely. Plan for stage PCI of RCA on Monday if no complication.     Cath 02/21/2017 Conclusion    Successful stent implantation in the proximal to mid RCA reducing a segmental 90% stenosis to 0% with TIMI grade 3 flow. Stent used was a 3.0 x 26 Onyx post dilated to 14 atm 2.  RECOMMENDATIONS:   Continue dual antiplatelet therapy (aspirin and  Brilinta)  Discharge per discretion of IC team.       ASSESSMENT:    No diagnosis found.   PLAN:  In order of problems listed above:  1. CAD: s/p STEMI in October 2018 with DES the LCx and RCA. She is asymptomatic. Continue ASA, lipitor, beta blocker.   2. Hypertension: Blood pressure is not at goal. Will increase irbesartan to 300 mg daily.   3. Hyperlipidemia: Cholesterol well controlled on 80 mg daily of Lipitor.   4. Tobacco abuse:  Counseled on complete cessation.     Medication Adjustments/Labs and Tests Ordered: Current medicines are reviewed at length with the Kimberly Huang today.  Concerns regarding medicines are outlined above.  Medication changes, Labs and Tests ordered today are listed in the Kimberly Huang Instructions below. There are no Kimberly Huang Instructions on file for this visit.   Signed, Muad Noga [name], MD  08/20/2019 8:54 AM    [address], [place], [place]  [address] Phone: [phone]; Fax: [phone]

## 2019-08-28 ENCOUNTER — Ambulatory Visit: Payer: Medicare Other | Admitting: Cardiology

## 2019-10-26 NOTE — Progress Notes (Signed)
Cardiology Office Note    Date:  10/29/2019   ID:  Kimberly Huang, DOB 1950/12/14, MRN [id number]  PCP:  Patient, No Pcp Per  Cardiologist:  Dr. [name]  Chief Complaint  Patient presents with   Follow-up    6 months   Coronary Artery Disease    History of Present Illness:  Kimberly Huang is a 69 y.o. female with past medical history of hypertension, hyperlipidemia, tobacco abuse and CAD.  Patient presented in October 2018 with chest pain and a posterior STEMI.  She ultimately underwent DES to her left circumflex, she also had residual RCA disease and that was intervened 48 hours later.  Ejection fraction was preserved on LV gram.    On follow up today she is doing well. No significant chest pain.  Denies any dyspnea, palpitations or edema.  She is still smoking off and on. Can quit for a few days or a week but then resumes. She recently injured her left knee and has gotten a steroid injection and is on a Medrol dose pack. This has limited her walking.   Past Medical History:  Diagnosis Date   CAD S/P PCI OM1 99%-0%; Plan Staged PCI mRCA 90%. 02/19/2017   1. Severe 2 vessel obstructive CAD    - 99% thrombotic occlusion of first OM. This is a bifurcating vessel.     - 90% mid RCA-segmental 2. Good overall LV dysfunction with lateral HK 3. Moderately elevated LVEDP 4. Successful stenting of the first OM with DES. Distal embolization into the distal lateral OM branch.  Plan: DAPT for one year. Will treat with IV Aggrastat for 18 hours. Start high dose statin, beta blocker. IV Ntg for BP control acutely. Plan for stage PCI of RCA on Monday 56/43 if no complication.   Essential hypertension 02/21/2017   Hyperlipidemia with target LDL less than 70 02/21/2017   Presence of drug-eluting stent in L Cx: OM1 (Xience SIERRA DES 3.5 x 15) 02/21/2017   STEMI involving left circumflex coronary artery ([place]) 02/19/2017   Tobacco abuse 02/19/2017    Past Surgical History:  Procedure  Laterality Date   CHOLECYSTECTOMY     CORONARY STENT INTERVENTION N/A 02/19/2017   Procedure: CORONARY STENT INTERVENTION;  Surgeon: [name], Flois Mctague M, MD;  Location: [facility];  Service: Cardiovascular;  Laterality: N/A;   CORONARY STENT INTERVENTION N/A 02/21/2017   Procedure: CORONARY STENT INTERVENTION;  Surgeon: Belva Crome, MD;  Location: [facility];  Service: Cardiovascular;  Laterality: N/A;   LEFT HEART CATH AND CORONARY ANGIOGRAPHY N/A 02/19/2017   Procedure: LEFT HEART CATH AND CORONARY ANGIOGRAPHY;  Surgeon: [name], Shahin Knierim M, MD;  Location: [facility];  Service: Cardiovascular;  Laterality: N/A;   tonsillectomy     TUBAL LIGATION      Current Medications: Outpatient Medications Prior to Visit  Medication Sig Dispense Refill   aspirin 81 MG chewable tablet Chew 1 tablet (81 mg total) by mouth daily.     atorvastatin (LIPITOR) 80 MG tablet TAKE 1 TABLET (80 MG TOTAL) BY MOUTH DAILY AT 6 PM. 90 tablet 1   irbesartan (AVAPRO) 300 MG tablet Take 1 tablet (300 mg total) by mouth daily. 90 tablet 3   metoprolol tartrate (LOPRESSOR) 25 MG tablet TAKE 1 TABLET BY MOUTH TWICE A DAY 180 tablet 3   nitroGLYCERIN (NITROSTAT) 0.4 MG SL tablet Place 1 tablet (0.4 mg total) under the tongue every 5 (five) minutes x 3 doses as needed  for chest pain. 25 tablet 2   predniSONE (STERAPRED UNI-PAK 21 TAB) 5 MG (21) TBPK tablet Take 1 tablet by mouth daily.     No facility-administered medications prior to visit.     Allergies:   Codeine   Social History   Socioeconomic History   Marital status: Married    Spouse name: Not on file   Number of children: 3   Years of education: Not on file   Highest education level: Not on file  Occupational History   Not on file  Tobacco Use   Smoking status: Current Some Day Smoker    Packs/day: 1.00    Types: Cigarettes   Smokeless tobacco: Never Used   Tobacco comment: Trying to quit  Vaping Use    Vaping Use: Never used  Substance and Sexual Activity   Alcohol use: No   Drug use: Never   Sexual activity: Not on file  Other Topics Concern   Not on file  Social History Narrative   Family runs the [employer].   Social Determinants of Health   Financial Resource Strain:    Difficulty of Paying Living Expenses:   Food Insecurity:    Worried About Charity fundraiser in the Last Year:    Arboriculturist in the Last Year:   Transportation Needs:    Film/video editor (Medical):    Lack of Transportation (Non-Medical):   Physical Activity:    Days of Exercise per Week:    Minutes of Exercise per Session:   Stress:    Feeling of Stress :   Social Connections:    Frequency of Communication with Friends and Family:    Frequency of Social Gatherings with Friends and Family:    Attends Religious Services:    Active Member of Clubs or Organizations:    Attends Archivist Meetings:    Marital Status:      Family History:  The patient's family history includes Diabetes in her mother; Heart attack in her sister.   ROS:   Please see the history of present illness.    ROS All other systems reviewed and are negative.   PHYSICAL EXAM:   VS:  BP 132/90 (BP Location: Left Arm, Patient Position: Sitting, Cuff Size: Large)    Pulse 62    Ht '5\' 4"'  (1.626 m)    Wt 192 lb (87.1 kg)    SpO2 97%    BMI 32.96 kg/m    GENERAL:  Well appearing WF in NAD HEENT:  PERRL, EOMI, sclera are clear. Oropharynx is clear. NECK:  No jugular venous distention, carotid upstroke brisk and symmetric, no bruits, no thyromegaly or adenopathy LUNGS:  Clear to auscultation bilaterally CHEST:  Unremarkable HEART:  RRR,  PMI not displaced or sustained,S1 and S2 within normal limits, no S3, no S4: no clicks, no rubs, no murmurs ABD:  Soft, nontender. BS +, no masses or bruits. No hepatomegaly, no splenomegaly EXT:  2 + pulses throughout, no edema, no cyanosis no  clubbing SKIN:  Warm and dry.  No rashes NEURO:  Alert and oriented x 3. Cranial nerves II through XII intact. PSYCH:  Cognitively intact    Wt Readings from Last 3 Encounters:  10/29/19 192 lb (87.1 kg)  02/20/19 194 lb 6.4 oz (88.2 kg)  05/18/18 190 lb (86.2 kg)      Studies/Labs Reviewed:   EKG:  EKG is not ordered today.   Recent Labs: 02/16/2019: ALT 21; BUN 11;  Creatinine, Ser 0.75; Potassium 4.4; Sodium 141   Lipid Panel    Component Value Date/Time   CHOL 144 02/16/2019 1045   TRIG 139 02/16/2019 1045   HDL 48 02/16/2019 1045   CHOLHDL 3.0 02/16/2019 1045   CHOLHDL 4.0 02/20/2017 0524   VLDL 16 02/20/2017 0524   LDLCALC 72 02/16/2019 1045    Additional studies/ records that were reviewed today include:   Cath 02/19/2017 Conclusion     Prox LAD to Mid LAD lesion, 20 %stenosed.  Prox RCA to Mid RCA lesion, 90 %stenosed.  Prox Cx to Mid Cx lesion, 30 %stenosed.  The left ventricular systolic function is normal.  LV end diastolic pressure is moderately elevated.  The left ventricular ejection fraction is 50-55% by visual estimate.  1st Mrg lesion, 99 %stenosed.  A STENT SIERRA 3.50 X 15 MM drug eluting stent was successfully placed.  Post intervention, there is a 0% residual stenosis.  Lat 1st Mrg lesion, 100 %stenosed.   1. Severe 2 vessel obstructive CAD    - 99% thrombotic occlusion of first OM. This is a bifurcating vessel.     - 90% mid RCA-segmental 2. Good overall LV dysfunction with lateral HK 3. Moderately elevated LVEDP 4. Successful stenting of the first OM with DES. Distal embolization into the distal lateral OM branch.  Plan: DAPT for one year. Will treat with IV Aggrastat for 18 hours. Start high dose statin, beta blocker. IV Ntg for BP control acutely. Plan for stage PCI of RCA on Monday if no complication.     Cath 02/21/2017 Conclusion    Successful stent implantation in the proximal to mid RCA reducing a segmental  90% stenosis to 0% with TIMI grade 3 flow. Stent used was a 3.0 x 26 Onyx post dilated to 14 atm 2.  RECOMMENDATIONS:   Continue dual antiplatelet therapy (aspirin and Brilinta)  Discharge per discretion of IC team.       ASSESSMENT:    1. Coronary artery disease involving native coronary artery of native heart without angina pectoris   2. Essential hypertension   3. Hyperlipidemia, unspecified hyperlipidemia type   4. Tobacco abuse      PLAN:  In order of problems listed above:  1. CAD: s/p STEMI in October 2018 with DES the LCx and RCA. She is asymptomatic. Continue ASA, lipitor, beta blocker.   2. Hypertension: Blood pressure is now controlled. Continue current meds  3. Hyperlipidemia: Cholesterol well controlled on 80 mg daily of Lipitor.   4. Tobacco abuse:  Counseled on complete cessation.   5.   Arthritis left knee. Per Ortho.     Medication Adjustments/Labs and Tests Ordered: Current medicines are reviewed at length with the patient today.  Concerns regarding medicines are outlined above.  Medication changes, Labs and Tests ordered today are listed in the Patient Instructions below. Patient Instructions  Stay off the cigarettes.  Continue your current therapy  Follow up in 6 months with lab work.        Signed, Jaquia Benedicto [name], MD  10/29/2019 5:54 PM    [facility] [address], [place], [place]  [address] Phone: [phone]; Fax: [phone]

## 2019-10-29 ENCOUNTER — Ambulatory Visit: Payer: Medicare Other | Admitting: Cardiology

## 2019-10-29 ENCOUNTER — Encounter: Payer: Self-pay | Admitting: Cardiology

## 2019-10-29 ENCOUNTER — Other Ambulatory Visit: Payer: Self-pay

## 2019-10-29 VITALS — BP 132/90 | HR 62 | Ht 64.0 in | Wt 192.0 lb

## 2019-10-29 DIAGNOSIS — E785 Hyperlipidemia, unspecified: Secondary | ICD-10-CM

## 2019-10-29 DIAGNOSIS — Z720 Tobacco use: Secondary | ICD-10-CM | POA: Diagnosis not present

## 2019-10-29 DIAGNOSIS — I2510 Atherosclerotic heart disease of native coronary artery without angina pectoris: Secondary | ICD-10-CM

## 2019-10-29 DIAGNOSIS — I10 Essential (primary) hypertension: Secondary | ICD-10-CM

## 2019-10-29 NOTE — Patient Instructions (Signed)
Stay off the cigarettes.  Continue your current therapy  Follow up in 6 months with lab work.

## 2019-12-17 ENCOUNTER — Telehealth: Payer: Self-pay

## 2019-12-17 NOTE — Telephone Encounter (Signed)
   [facility] Pre-operative Risk Assessment    Request for surgical clearance:  1. What type of surgery is being performed? LEFT KNEE ARTHROSCOPY   2. When is this surgery scheduled? TBD   3. What type of clearance is required (medical clearance vs. Pharmacy clearance to hold med vs. Both)? MEDICAL  4. Are there any medications that need to be held prior to surgery and how long? NONE   5. Practice name and name of physician performing surgery? [facility] DR Ardyth Gal, MD   6. What is the office phone number? [phone]   7.   What is the office fax number? [phone]  8.   Anesthesia type (None, local, MAC, general) ? GENERAL

## 2019-12-17 NOTE — Telephone Encounter (Signed)
   Primary Cardiologist: Peter [name], MD  Chart reviewed as part of pre-operative protocol coverage. Given past medical history and time since last visit, based on ACC/AHA guidelines, [name] would be at acceptable risk for the planned procedure without further cardiovascular testing.   I will route this recommendation to the requesting party via Epic fax function and remove from pre-op pool.  Please call with questions.  Jossie Ng. Carlis Burnsworth NP-C    12/17/2019, 9:23 AM [address] Office [phone] Fax [phone]

## 2019-12-25 ENCOUNTER — Ambulatory Visit: Payer: Self-pay | Admitting: Orthopedic Surgery

## 2020-01-01 NOTE — Patient Instructions (Addendum)
DUE TO COVID-19 ONLY ONE VISITOR IS ALLOWED TO COME WITH YOU AND STAY IN THE WAITING ROOM ONLY DURING PRE OP AND PROCEDURE DAY OF SURGERY. THE 1 VISITOR  MAY VISIT WITH YOU AFTER SURGERY IN YOUR PRIVATE ROOM DURING VISITING HOURS ONLY!  YOU NEED TO HAVE A COVID 19 TEST ON_9/4______ @_11 :30______, THIS TEST MUST BE DONE BEFORE SURGERY,  COVID TESTING SITE [place] [id number], IT IS ON THE RIGHT GOING OUT [address] APPROXIMATELY  2 MINUTES PAST [employer] ON THE RIGHT. ONCE YOUR COVID TEST IS COMPLETED,  PLEASE BEGIN THE QUARANTINE INSTRUCTIONS AS OUTLINED IN YOUR HANDOUT.                Kimberly Huang    Your procedure is scheduled on: 01/09/20   Report to [facility] Main  Entrance   Report to admitting at  6:30 AM     Call this number if you have problems the morning of surgery [place], NO CHEWING GUM [place].   No food after midnight.    You may have clear liquid until 5:30 AM.    At 5:30 AM drink pre surgery drink  . Nothing by mouth after 5:30 AM.   Take these medicines the morning of surgery with A SIP OF WATER: Metoprolol                                 You may not have any metal on your body including hair pins and              piercings  Do not wear jewelry, make-up, lotions, powders or perfumes, deodorant             Do not wear nail polish on your fingernails.  Do not shave  48 hours prior to surgery.               Do not bring valuables to the hospital. [place].  Contacts, dentures or bridgework may not be worn into surgery.     Patients discharged the day of surgery will not be allowed to drive home.   IF YOU ARE HAVING SURGERY AND GOING HOME THE SAME DAY, YOU MUST HAVE AN ADULT TO DRIVE YOU HOME AND BE WITH YOU FOR 24 HOURS.  YOU MAY GO HOME BY TAXI OR UBER OR ORTHERWISE, BUT AN ADULT  MUST ACCOMPANY YOU HOME AND STAY WITH YOU FOR 24 HOURS.  Name and phone number of your driver:  Special Instructions: N/A              Please read over the following fact sheets you were given: _____________________________________________________________________             [facility] - Preparing for Surgery  Before surgery, you can play an important role.  Because skin is not sterile, your skin needs to be as free of germs as possible.  You can reduce the number of germs on your skin by washing with CHG (chlorahexidine gluconate) soap before surgery.  CHG is an antiseptic cleaner which kills germs and bonds with the skin to continue killing germs even after washing. Please DO NOT use if you have an allergy to CHG  or antibacterial soaps.  If your skin becomes reddened/irritated stop using the CHG and inform your nurse when you arrive at Short Stay. Do not shave (including legs and underarms) for at least 48 hours prior to the first CHG shower.  Please follow these instructions carefully:  1.  Shower with CHG Soap the night before surgery and the  morning of Surgery.  2.  If you choose to wash your hair, wash your hair first as usual with your  normal  shampoo.  3.  After you shampoo, rinse your hair and body thoroughly to remove the  shampoo.                                        4.  Use CHG as you would any other liquid soap.  You can apply chg directly  to the skin and wash                       Gently with a scrungie or clean washcloth.  5.  Apply the CHG Soap to your body ONLY FROM THE NECK DOWN.   Do not use on face/ open                           Wound or open sores. Avoid contact with eyes, ears mouth and genitals (private parts).                       Wash face,  Genitals (private parts) with your normal soap.             6.  Wash thoroughly, paying special attention to the area where your surgery  will be performed.  7.  Thoroughly rinse your body with warm water from the neck  down.  8.  DO NOT shower/wash with your normal soap after using and rinsing off  the CHG Soap.             9.  Pat yourself dry with a clean towel.            10.  Wear clean pajamas.            11.  Place clean sheets on your bed the night of your first shower and do not  sleep with pets. Day of Surgery : Do not apply any lotions/deodorants the morning of surgery.  Please wear clean clothes to the hospital/surgery center.  FAILURE TO FOLLOW THESE INSTRUCTIONS MAY RESULT IN THE CANCELLATION OF YOUR SURGERY PATIENT SIGNATURE_________________________________  NURSE SIGNATURE__________________________________  ________________________________________________________________________   Kimberly Huang  An incentive spirometer is a tool that can help keep your lungs clear and active. This tool measures how well you are filling your lungs with each breath. Taking long deep breaths may help reverse or decrease the chance of developing breathing (pulmonary) problems (especially infection) following:  A long period of time when you are unable to move or be active. BEFORE THE PROCEDURE   If the spirometer includes an indicator to show your best effort, your nurse or respiratory therapist will set it to a desired goal.  If possible, sit up straight or lean slightly forward. Try not to slouch.  Hold the incentive spirometer in an upright position. INSTRUCTIONS FOR USE  1. Sit on the edge of your bed if possible, or sit up as  far as you can in bed or on a chair. 2. Hold the incentive spirometer in an upright position. 3. Breathe out normally. 4. Place the mouthpiece in your mouth and seal your lips tightly around it. 5. Breathe in slowly and as deeply as possible, raising the piston or the ball toward the top of the column. 6. Hold your breath for 3-5 seconds or for as long as possible. Allow the piston or ball to fall to the bottom of the column. 7. Remove the mouthpiece from your mouth  and breathe out normally. 8. Rest for a few seconds and repeat Steps 1 through 7 at least 10 times every 1-2 hours when you are awake. Take your time and take a few normal breaths between deep breaths. 9. The spirometer may include an indicator to show your best effort. Use the indicator as a goal to work toward during each repetition. 10. After each set of 10 deep breaths, practice coughing to be sure your lungs are clear. If you have an incision (the cut made at the time of surgery), support your incision when coughing by placing a pillow or rolled up towels firmly against it. Once you are able to get out of bed, walk around indoors and cough well. You may stop using the incentive spirometer when instructed by your caregiver.  RISKS AND COMPLICATIONS  Take your time so you do not get dizzy or light-headed.  If you are in pain, you may need to take or ask for pain medication before doing incentive spirometry. It is harder to take a deep breath if you are having pain. AFTER USE  Rest and breathe slowly and easily.  It can be helpful to keep track of a log of your progress. Your caregiver can provide you with a simple table to help with this. If you are using the spirometer at home, follow these instructions: [place] IF:   You are having difficultly using the spirometer.  You have trouble using the spirometer as often as instructed.  Your pain medication is not giving enough relief while using the spirometer.  You develop fever of 100.5 F (38.1 C) or higher. SEEK IMMEDIATE MEDICAL CARE IF:   You cough up bloody sputum that had not been present before.  You develop fever of 102 F (38.9 C) or greater.  You develop worsening pain at or near the incision site. MAKE SURE YOU:   Understand these instructions.  Will watch your condition.  Will get help right away if you are not doing well or get worse. Document Released: 08/30/2006 Document Revised: 07/12/2011 Document  Reviewed: 10/31/2006 [facility] Patient Information 2014 [place], [place].   ________________________________________________________________________

## 2020-01-02 ENCOUNTER — Other Ambulatory Visit: Payer: Self-pay

## 2020-01-02 ENCOUNTER — Encounter (HOSPITAL_COMMUNITY)
Admission: RE | Admit: 2020-01-02 | Discharge: 2020-01-02 | Disposition: A | Discharge status: Home / Self Care | Payer: Medicare Other | Source: Ambulatory Visit | Attending: Specialist | Admitting: Specialist

## 2020-01-02 ENCOUNTER — Encounter (HOSPITAL_COMMUNITY): Payer: Self-pay

## 2020-01-02 DIAGNOSIS — Z01812 Encounter for preprocedural laboratory examination: Secondary | ICD-10-CM | POA: Diagnosis present

## 2020-01-02 HISTORY — DX: Unspecified osteoarthritis, unspecified site: M19.90

## 2020-01-02 LAB — BASIC METABOLIC PANEL
Anion gap: 10 (ref 5–15)
BUN: 11 mg/dL (ref 8–23)
CO2: 24 mmol/L (ref 22–32)
Calcium: 9 mg/dL (ref 8.9–10.3)
Chloride: 102 mmol/L (ref 98–111)
Creatinine, Ser: 0.79 mg/dL (ref 0.44–1.00)
GFR calc Af Amer: >60 mL/min (ref >60)
GFR calc non Af Amer: >60 mL/min (ref >60)
Glucose, Bld: 112 mg/dL — ABNORMAL HIGH (ref 70–99)
Potassium: 4.4 mmol/L (ref 3.5–5.1)
Sodium: 136 mmol/L (ref 135–145)

## 2020-01-02 LAB — CBC
HCT: 43.2 % (ref 36.0–46.0)
Hemoglobin: 14.6 g/dL (ref 12.0–15.0)
MCH: 30.9 pg (ref 26.0–34.0)
MCHC: 33.8 g/dL (ref 30.0–36.0)
MCV: 91.3 fL (ref 80.0–100.0)
Platelets: 250 10*3/uL (ref 150–400)
RBC: 4.73 MIL/uL (ref 3.87–5.11)
RDW: 12.8 % (ref 11.5–15.5)
WBC: 7.4 10*3/uL (ref 4.0–10.5)
nRBC: 0 % (ref 0.0–0.2)

## 2020-01-02 NOTE — Progress Notes (Signed)
COVID Vaccine Completed:Yes Date COVID Vaccine completed:07/16/19 COVID vaccine manufacturer: Pfizer     PCP - Dr. Lonia Blood Cardiologist - Dr. Volanda Napoleon  Chest x-ray - no EKG - 02/20/19 Stress Test - no ECHO -no  Cardiac Cath -  2 stents 2018  Sleep Study - no CPAP -   Fasting Blood Sugar - NA Checks Blood Sugar _____ times a day  Blood Thinner Instructions:ASA/ Dr. Lonia Blood Aspirin Instructions:Stop 7 days prior to DOS/ Dr. Lonia Blood Last Dose:01/02/20  Anesthesia review:   Patient denies shortness of breath, fever, cough and chest pain at PAT appointment yes   Patient verbalized understanding of instructions that were given to them at the PAT appointment. Patient was also instructed that they will need to review over the PAT instructions again at home before surgery. Yes  Pt is able to climb 2 flights of stairs without SOB.  She has no SOB with doing house work or with ADLs.

## 2020-01-05 ENCOUNTER — Other Ambulatory Visit (HOSPITAL_COMMUNITY): Payer: Medicare Other

## 2020-01-05 ENCOUNTER — Other Ambulatory Visit (HOSPITAL_COMMUNITY)
Admission: RE | Admit: 2020-01-05 | Discharge: 2020-01-05 | Disposition: A | Discharge status: Home / Self Care | Payer: Medicare Other | Source: Ambulatory Visit | Attending: Specialist | Admitting: Specialist

## 2020-01-05 DIAGNOSIS — Z01812 Encounter for preprocedural laboratory examination: Secondary | ICD-10-CM | POA: Insufficient documentation

## 2020-01-05 DIAGNOSIS — Z20822 Contact with and (suspected) exposure to covid-19: Secondary | ICD-10-CM | POA: Diagnosis not present

## 2020-01-05 LAB — SARS CORONAVIRUS 2 (TAT 6-24 HRS): SARS Coronavirus 2: NEGATIVE

## 2020-01-08 ENCOUNTER — Ambulatory Visit: Payer: Self-pay | Admitting: Orthopedic Surgery

## 2020-01-08 NOTE — H&P (View-Only) (Signed)
Kimberly Huang is an 69 y.o. female.   Chief Complaint: L knee pain HPI: Visit For: Follow up Location: left; knee Duration: The patient is 10 1/2 weeks out from onset Quality: aching; sharp; swelling has improved but not the pain Severity: moderate; pain level 4/10 Timing: chronic Alleviating Factors: heat; rest; elevation; OTC medication; NSAIDs; tylenol and occasional advil Associated Symptoms: no catching/locking; no instability; popping/clicking Previous Surgery: none Notes: The patient is 2 weeks out from US guided aspiration/injection by Dr. Delilah Shan  Past Medical History:  Diagnosis Date   Arthritis    knee, hands   CAD S/P PCI OM1 99%-0%; Plan Staged PCI mRCA 90%. 02/19/2017   1. Severe 2 vessel obstructive CAD    - 99% thrombotic occlusion of first OM. This is a bifurcating vessel.     - 90% mid RCA-segmental 2. Good overall LV dysfunction with lateral HK 3. Moderately elevated LVEDP 4. Successful stenting of the first OM with DES. Distal embolization into the distal lateral OM branch.  Plan: DAPT for one year. Will treat with IV Aggrastat for 18 hours. Start high dose statin, beta blocker. IV Ntg for BP control acutely. Plan for stage PCI of RCA on Monday 80/32 if no complication.   Essential hypertension 02/21/2017   Hyperlipidemia with target LDL less than 70 02/21/2017   Presence of drug-eluting stent in L Cx: OM1 (Xience SIERRA DES 3.5 x 15) 02/21/2017   STEMI involving left circumflex coronary artery ([place]) 02/19/2017   Tobacco abuse 02/19/2017    Past Surgical History:  Procedure Laterality Date   CHOLECYSTECTOMY     CORONARY STENT INTERVENTION N/A 02/19/2017   Procedure: CORONARY STENT INTERVENTION;  Surgeon: [name], Peter M, MD;  Location: [place] [facility];  Service: Cardiovascular;  Laterality: N/A;   CORONARY STENT INTERVENTION N/A 02/21/2017   Procedure: CORONARY STENT INTERVENTION;  Surgeon: Belva Crome, MD;  Location: [place] [facility];   Service: Cardiovascular;  Laterality: N/A;   KNEE ARTHROSCOPY Right 2010   LEFT HEART CATH AND CORONARY ANGIOGRAPHY N/A 02/19/2017   Procedure: LEFT HEART CATH AND CORONARY ANGIOGRAPHY;  Surgeon: [name], Peter M, MD;  Location: [place] [facility];  Service: Cardiovascular;  Laterality: N/A;   tonsillectomy     TUBAL LIGATION      Family History  Problem Relation Age of Onset   Diabetes Mother    Heart attack Sister    Social History:  reports that she has been smoking cigarettes. She has been smoking about 1.00 pack per day. She has never used smokeless tobacco. She reports current alcohol use. She reports that she does not use drugs.  Allergies:  Allergies  Allergen Reactions   Codeine Nausea Only    (Not in a hospital admission)   No results found for this or any previous visit (from the past 48 hour(s)). No results found.  Review of Systems  Constitutional: Negative.   HENT: Negative.   Eyes: Negative.   Respiratory: Negative.   Cardiovascular: Negative.   Gastrointestinal: Negative.   Endocrine: Negative.   Genitourinary: Negative.   Musculoskeletal: Positive for arthralgias and joint swelling.  Skin: Negative.   Neurological: Negative.   Psychiatric/Behavioral: Negative.     There were no vitals taken for this visit. Physical Exam Constitutional:      Appearance: Normal appearance.  HENT:     Head: Normocephalic.     Right Ear: External ear normal.     Left Ear: External ear normal.  Nose: Nose normal.     Mouth/Throat:     Mouth: Mucous membranes are moist.  Cardiovascular:     Rate and Rhythm: Regular rhythm.     Pulses: Normal pulses.  Pulmonary:     Effort: Pulmonary effort is normal.  Abdominal:     General: Bowel sounds are normal.  Musculoskeletal:     Cervical back: Normal range of motion.     Comments: Patient is a 69 year old female.  On exam she is tender medial joint line and has a mild effusion. She is no tenderness  posteriorly. Equivocal [place].  Skin:    General: Skin is warm and dry.  Neurological:     Mental Status: She is alert.    MRI of the left knee demonstrates a subchondral fracture of the medial tibial plateau with edema.  High-grade complex tear of the medial meniscus. 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 Large joint effusion. Leaking large popliteal cyst.  The center of the subchondral fracture is 7 mm from the tibial surface. 10 mm from the medial cortex of the tibia and 12 mm from the anterior portion of the tibia.  Assessment/Plan Impression:  Patient status post successful Baker's cyst aspiration injection of cortisone. With a decompression posteriorly.  She has persistent knee pain and giving way consistent with her meniscus tear osteoarthritis in her subchondral fracture.  Plan:  We discussed living with her symptoms and giving her more time she does not feel it is improved any. We discussed surgical options from total knee replacement to knee arthroscopy partial meniscectomy. And at that time a subchondroplasty. Which would be injection of cement into the subchondral region.  We discussed this as an attempt to alleviate weightbearing pain. Again if not successful we discussed ultimately she may require a total knee replacement.  She does have a reasonable joint space on the left as opposed to the right.  We may have to have a period of time postoperatively for protected weightbearing as well  I discussed the risk and benefits of knee arthroscopy including no changes in their symptoms worsening in their symptoms DVT, PE, anesthetic complications etc. Surgical possibilities include chondroplasty, microfracture, partial meniscectomy, plica excision etc. We also discussed the possible need for repeat arthroscopy in the future as well as possible continued treatment including corticosteroid injections and possible Visco supplementation. In addition we discussed  the possibility of even eventually required a total knee replacement if significant arthritis encountered. Also indicating that it is an outpatient procedure. 1-2 days on crutches. Postoperative DVT prophylaxis with aspirin if tolerated. Follow-up in the office in 2 weeks following the surgery. Possible consideration of formal of supervised physical therapy as well.  No history of DVT or MRSA exposure  Plan L knee scope, partial medial meniscectomy, debridement, subchondroplasty left proximal tibia  Cecilie Kicks, PA-C for Dr. Tonita Cong 01/08/2020, 11:41 AM

## 2020-01-08 NOTE — H&P (Signed)
Kimberly Huang is an 69 y.o. female.   Chief Complaint: L knee pain HPI: Visit For: Follow up Location: left; knee Duration: The patient is 10 1/2 weeks out from onset Quality: aching; sharp; swelling has improved but not the pain Severity: moderate; pain level 4/10 Timing: chronic Alleviating Factors: heat; rest; elevation; OTC medication; NSAIDs; tylenol and occasional advil Associated Symptoms: no catching/locking; no instability; popping/clicking Previous Surgery: none Notes: The patient is 2 weeks out from US guided aspiration/injection by Dr. Delilah Shan  Past Medical History:  Diagnosis Date  . Arthritis    knee, hands  . CAD S/P PCI OM1 99%-0%; Plan Staged PCI mRCA 90%. 02/19/2017   1. Severe 2 vessel obstructive CAD    - 99% thrombotic occlusion of first OM. This is a bifurcating vessel.     - 90% mid RCA-segmental 2. Good overall LV dysfunction with lateral HK 3. Moderately elevated LVEDP 4. Successful stenting of the first OM with DES. Distal embolization into the distal lateral OM branch.  Plan: DAPT for one year. Will treat with IV Aggrastat for 18 hours. Start high dose statin, beta blocker. IV Ntg for BP control acutely. Plan for stage PCI of RCA on Monday 90/24 if no complication.  . Essential hypertension 02/21/2017  . Hyperlipidemia with target LDL less than 70 02/21/2017  . Presence of drug-eluting stent in L Cx: OM1 (Xience SIERRA DES 3.5 x 15) 02/21/2017  . STEMI involving left circumflex coronary artery ([place]) 02/19/2017  . Tobacco abuse 02/19/2017    Past Surgical History:  Procedure Laterality Date  . CHOLECYSTECTOMY    . CORONARY STENT INTERVENTION N/A 02/19/2017   Procedure: CORONARY STENT INTERVENTION;  Surgeon: [name], Peter M, MD;  Location: [place] [facility];  Service: Cardiovascular;  Laterality: N/A;  . CORONARY STENT INTERVENTION N/A 02/21/2017   Procedure: CORONARY STENT INTERVENTION;  Surgeon: Belva Crome, MD;  Location: [place] [facility];   Service: Cardiovascular;  Laterality: N/A;  . KNEE ARTHROSCOPY Right 2010  . LEFT HEART CATH AND CORONARY ANGIOGRAPHY N/A 02/19/2017   Procedure: LEFT HEART CATH AND CORONARY ANGIOGRAPHY;  Surgeon: [name], Peter M, MD;  Location: [place] [facility];  Service: Cardiovascular;  Laterality: N/A;  . tonsillectomy    . TUBAL LIGATION      Family History  Problem Relation Age of Onset  . Diabetes Mother   . Heart attack Sister    Social History:  reports that she has been smoking cigarettes. She has been smoking about 1.00 pack per day. She has never used smokeless tobacco. She reports current alcohol use. She reports that she does not use drugs.  Allergies:  Allergies  Allergen Reactions  . Codeine Nausea Only    (Not in a hospital admission)   No results found for this or any previous visit (from the past 48 hour(s)). No results found.  Review of Systems  Constitutional: Negative.   HENT: Negative.   Eyes: Negative.   Respiratory: Negative.   Cardiovascular: Negative.   Gastrointestinal: Negative.   Endocrine: Negative.   Genitourinary: Negative.   Musculoskeletal: Positive for arthralgias and joint swelling.  Skin: Negative.   Neurological: Negative.   Psychiatric/Behavioral: Negative.     There were no vitals taken for this visit. Physical Exam Constitutional:      Appearance: Normal appearance.  HENT:     Head: Normocephalic.     Right Ear: External ear normal.     Left Ear: External ear normal.  Nose: Nose normal.     Mouth/Throat:     Mouth: Mucous membranes are moist.  Cardiovascular:     Rate and Rhythm: Regular rhythm.     Pulses: Normal pulses.  Pulmonary:     Effort: Pulmonary effort is normal.  Abdominal:     General: Bowel sounds are normal.  Musculoskeletal:     Cervical back: Normal range of motion.     Comments: Patient is a 69 year old female.  On exam she is tender medial joint line and has a mild effusion. She is no tenderness  posteriorly. Equivocal [place].  Skin:    General: Skin is warm and dry.  Neurological:     Mental Status: She is alert.    MRI of the left knee demonstrates a subchondral fracture of the medial tibial plateau with edema.  High-grade complex tear of the medial meniscus. 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 Large joint effusion. Leaking large popliteal cyst.  The center of the subchondral fracture is 7 mm from the tibial surface. 10 mm from the medial cortex of the tibia and 12 mm from the anterior portion of the tibia.  Assessment/Plan Impression:  Patient status post successful Baker's cyst aspiration injection of cortisone. With a decompression posteriorly.  She has persistent knee pain and giving way consistent with her meniscus tear osteoarthritis in her subchondral fracture.  Plan:  We discussed living with her symptoms and giving her more time she does not feel it is improved any. We discussed surgical options from total knee replacement to knee arthroscopy partial meniscectomy. And at that time a subchondroplasty. Which would be injection of cement into the subchondral region.  We discussed this as an attempt to alleviate weightbearing pain. Again if not successful we discussed ultimately she may require a total knee replacement.  She does have a reasonable joint space on the left as opposed to the right.  We may have to have a period of time postoperatively for protected weightbearing as well  I discussed the risk and benefits of knee arthroscopy including no changes in their symptoms worsening in their symptoms DVT, PE, anesthetic complications etc. Surgical possibilities include chondroplasty, microfracture, partial meniscectomy, plica excision etc. We also discussed the possible need for repeat arthroscopy in the future as well as possible continued treatment including corticosteroid injections and possible Visco supplementation. In addition we discussed  the possibility of even eventually required a total knee replacement if significant arthritis encountered. Also indicating that it is an outpatient procedure. 1-2 days on crutches. Postoperative DVT prophylaxis with aspirin if tolerated. Follow-up in the office in 2 weeks following the surgery. Possible consideration of formal of supervised physical therapy as well.  No history of DVT or MRSA exposure  Plan L knee scope, partial medial meniscectomy, debridement, subchondroplasty left proximal tibia  Cecilie Kicks, PA-C for Dr. Tonita Cong 01/08/2020, 11:41 AM

## 2020-01-08 NOTE — Progress Notes (Signed)
Anesthesia Chart Review   Case: [id number] Date/Time: 01/09/20 0815   Procedure: Left knee arthroscopy,partial medial meniscectomy, debridement, subchondroplasty left proximal tibia (Left Knee)   Anesthesia type: General   Pre-op diagnosis: Medial meniscus tear, degenerative joint disease, subchondral fracture left knee   Location: [facility] ROOM 03 / [name]   Surgeons: Susa Day, MD      DISCUSSION:69 y.o. current some day smoker with h/o HTN, CAD (DES 2018), left knee medial meniscus tear, djd scheduled for above procedure 01/09/2020 with Dr. Susa Day.    Per cardiology preoperative risk assessment 12/17/2019, "Chart reviewed as part of pre-operative protocol coverage. Given past medical history and time since last visit, based on ACC/AHA guidelines, Kimberly Huang would be at acceptable risk for the planned procedure without further cardiovascular testing."  Anticipate pt can proceed with planned procedure barring acute status change.   VS: There were no vitals taken for this visit.  PROVIDERS: Patient, No Pcp Per  [name], Peter, MD is Cardiologist last seen 10/29/2019 LABS: Labs reviewed: Acceptable for surgery. (all labs ordered are listed, but only abnormal results are displayed)  Labs Reviewed  BASIC METABOLIC PANEL - Abnormal; Notable for the following components:      Result Value   Glucose, Bld 112 (*)    All other components within normal limits  CBC     IMAGES:   EKG: 02/20/2019 Rate 71 bpm Sinus rhythm with 1st degree AV block  LAFB Minimal voltage criteria for LVH Septal infarct, age undetermined   CV:  Past Medical History:  Diagnosis Date  . Arthritis    knee, hands  . CAD S/P PCI OM1 99%-0%; Plan Staged PCI mRCA 90%. 02/19/2017   1. Severe 2 vessel obstructive CAD    - 99% thrombotic occlusion of first OM. This is a bifurcating vessel.     - 90% mid RCA-segmental 2. Good overall LV dysfunction with lateral HK 3. Moderately elevated LVEDP 4.  Successful stenting of the first OM with DES. Distal embolization into the distal lateral OM branch.  Plan: DAPT for one year. Will treat with IV Aggrastat for 18 hours. Start high dose statin, beta blocker. IV Ntg for BP control acutely. Plan for stage PCI of RCA on Monday 27/06 if no complication.  . Essential hypertension 02/21/2017  . Hyperlipidemia with target LDL less than 70 02/21/2017  . Presence of drug-eluting stent in L Cx: OM1 (Xience SIERRA DES 3.5 x 15) 02/21/2017  . STEMI involving left circumflex coronary artery ([place]) 02/19/2017  . Tobacco abuse 02/19/2017    Past Surgical History:  Procedure Laterality Date  . CHOLECYSTECTOMY    . CORONARY STENT INTERVENTION N/A 02/19/2017   Procedure: CORONARY STENT INTERVENTION;  Surgeon: [name], Peter M, MD;  Location: [facility];  Service: Cardiovascular;  Laterality: N/A;  . CORONARY STENT INTERVENTION N/A 02/21/2017   Procedure: CORONARY STENT INTERVENTION;  Surgeon: Belva Crome, MD;  Location: [facility];  Service: Cardiovascular;  Laterality: N/A;  . KNEE ARTHROSCOPY Right 2010  . LEFT HEART CATH AND CORONARY ANGIOGRAPHY N/A 02/19/2017   Procedure: LEFT HEART CATH AND CORONARY ANGIOGRAPHY;  Surgeon: [name], Peter M, MD;  Location: [facility];  Service: Cardiovascular;  Laterality: N/A;  . tonsillectomy    . TUBAL LIGATION      MEDICATIONS: . acetaminophen (TYLENOL) 500 MG tablet  . aspirin 81 MG chewable tablet  . atorvastatin (LIPITOR) 80 MG tablet  . Coenzyme Q10 (CO Q 10) 100  MG CAPS  . diclofenac Sodium (VOLTAREN) 1 % GEL  . irbesartan (AVAPRO) 300 MG tablet  . metoprolol tartrate (LOPRESSOR) 25 MG tablet  . nitroGLYCERIN (NITROSTAT) 0.4 MG SL tablet  . Polyethyl Glycol-Propyl Glycol (SYSTANE FREE OP)  . trolamine salicylate (ASPERCREME/ALOE) 10 % cream   No current facility-administered medications for this encounter.   Konrad Felix, PA-C [facility] [phone]

## 2020-01-09 ENCOUNTER — Encounter (HOSPITAL_COMMUNITY): Payer: Self-pay | Admitting: Specialist

## 2020-01-09 ENCOUNTER — Ambulatory Visit (HOSPITAL_COMMUNITY): Payer: Medicare Other | Admitting: Anesthesiology

## 2020-01-09 ENCOUNTER — Encounter (HOSPITAL_COMMUNITY)
Admission: RE | Disposition: A | Discharge status: Home / Self Care | Payer: Self-pay | Source: Ambulatory Visit | Attending: Specialist

## 2020-01-09 ENCOUNTER — Ambulatory Visit (HOSPITAL_COMMUNITY): Payer: Medicare Other | Admitting: Physician Assistant

## 2020-01-09 ENCOUNTER — Ambulatory Visit (HOSPITAL_COMMUNITY): Payer: Medicare Other

## 2020-01-09 ENCOUNTER — Ambulatory Visit (HOSPITAL_COMMUNITY)
Admission: RE | Admit: 2020-01-09 | Discharge: 2020-01-09 | Disposition: A | Discharge status: Home / Self Care | Payer: Medicare Other | Source: Ambulatory Visit | Attending: Specialist | Admitting: Specialist

## 2020-01-09 DIAGNOSIS — Z955 Presence of coronary angioplasty implant and graft: Secondary | ICD-10-CM | POA: Insufficient documentation

## 2020-01-09 DIAGNOSIS — I10 Essential (primary) hypertension: Secondary | ICD-10-CM | POA: Insufficient documentation

## 2020-01-09 DIAGNOSIS — M1712 Unilateral primary osteoarthritis, left knee: Secondary | ICD-10-CM | POA: Insufficient documentation

## 2020-01-09 DIAGNOSIS — X58XXXA Exposure to other specified factors, initial encounter: Secondary | ICD-10-CM | POA: Insufficient documentation

## 2020-01-09 DIAGNOSIS — I252 Old myocardial infarction: Secondary | ICD-10-CM | POA: Insufficient documentation

## 2020-01-09 DIAGNOSIS — Z885 Allergy status to narcotic agent status: Secondary | ICD-10-CM | POA: Insufficient documentation

## 2020-01-09 DIAGNOSIS — S83242A Other tear of medial meniscus, current injury, left knee, initial encounter: Secondary | ICD-10-CM | POA: Diagnosis present

## 2020-01-09 DIAGNOSIS — F17210 Nicotine dependence, cigarettes, uncomplicated: Secondary | ICD-10-CM | POA: Insufficient documentation

## 2020-01-09 DIAGNOSIS — Z419 Encounter for procedure for purposes other than remedying health state, unspecified: Secondary | ICD-10-CM

## 2020-01-09 DIAGNOSIS — I2510 Atherosclerotic heart disease of native coronary artery without angina pectoris: Secondary | ICD-10-CM | POA: Diagnosis not present

## 2020-01-09 DIAGNOSIS — Z8249 Family history of ischemic heart disease and other diseases of the circulatory system: Secondary | ICD-10-CM | POA: Insufficient documentation

## 2020-01-09 DIAGNOSIS — S83282A Other tear of lateral meniscus, current injury, left knee, initial encounter: Secondary | ICD-10-CM | POA: Insufficient documentation

## 2020-01-09 DIAGNOSIS — M8438XA Stress fracture, other site, initial encounter for fracture: Secondary | ICD-10-CM | POA: Insufficient documentation

## 2020-01-09 HISTORY — PX: KNEE ARTHROSCOPY WITH SUBCHONDROPLASTY: SHX6732

## 2020-01-09 SURGERY — ARTHROSCOPY, KNEE, WITH SUBCHONDROPLASTY
Anesthesia: General | Site: Knee | Laterality: Left

## 2020-01-09 MED ORDER — PROPOFOL 10 MG/ML IV BOLUS
INTRAVENOUS | Status: AC
  Filled 2020-01-09: qty 20

## 2020-01-09 MED ORDER — KETOROLAC TROMETHAMINE 30 MG/ML IJ SOLN
INTRAMUSCULAR | Status: DC | PRN
  Administered 2020-01-09: 30 mg via INTRAVENOUS

## 2020-01-09 MED ORDER — AMISULPRIDE (ANTIEMETIC) 5 MG/2ML IV SOLN: 10.0000 mg | Freq: Once | INTRAVENOUS | Status: DC | PRN

## 2020-01-09 MED ORDER — LIDOCAINE 2% (20 MG/ML) 5 ML SYRINGE
INTRAMUSCULAR | Status: DC | PRN
  Administered 2020-01-09: 60 mg via INTRAVENOUS

## 2020-01-09 MED ORDER — FENTANYL CITRATE (PF) 100 MCG/2ML IJ SOLN
INTRAMUSCULAR | Status: AC
  Filled 2020-01-09: qty 2

## 2020-01-09 MED ORDER — 0.9 % SODIUM CHLORIDE (POUR BTL) OPTIME
TOPICAL | Status: DC | PRN
  Administered 2020-01-09: 1000 mL

## 2020-01-09 MED ORDER — ROPIVACAINE HCL 5 MG/ML IJ SOLN
INTRAMUSCULAR | Status: DC | PRN
  Administered 2020-01-09: 20 mL via EPIDURAL

## 2020-01-09 MED ORDER — DEXAMETHASONE SODIUM PHOSPHATE 10 MG/ML IJ SOLN
INTRAMUSCULAR | Status: DC | PRN
  Administered 2020-01-09: 8 mg via INTRAVENOUS

## 2020-01-09 MED ORDER — BUPIVACAINE-EPINEPHRINE (PF) 0.5% -1:200000 IJ SOLN
INTRAMUSCULAR | Status: AC
  Filled 2020-01-09: qty 30

## 2020-01-09 MED ORDER — ORAL CARE MOUTH RINSE: 15.0000 mL | Freq: Once | OROMUCOSAL | Status: AC

## 2020-01-09 MED ORDER — CEFAZOLIN SODIUM-DEXTROSE 2-4 GM/100ML-% IV SOLN
2.0000 g | INTRAVENOUS | Status: AC
  Administered 2020-01-09: 2 g via INTRAVENOUS
  Filled 2020-01-09: qty 100

## 2020-01-09 MED ORDER — EPHEDRINE SULFATE-NACL 50-0.9 MG/10ML-% IV SOSY
PREFILLED_SYRINGE | INTRAVENOUS | Status: DC | PRN
  Administered 2020-01-09: 10 mg via INTRAVENOUS
  Administered 2020-01-09 (×2): 5 mg via INTRAVENOUS
  Administered 2020-01-09: 10 mg via INTRAVENOUS

## 2020-01-09 MED ORDER — ONDANSETRON HCL 4 MG/2ML IJ SOLN
INTRAMUSCULAR | Status: DC | PRN
  Administered 2020-01-09: 4 mg via INTRAVENOUS

## 2020-01-09 MED ORDER — EPHEDRINE 5 MG/ML INJ
INTRAVENOUS | Status: AC
  Filled 2020-01-09: qty 10

## 2020-01-09 MED ORDER — ACETAMINOPHEN 500 MG PO TABS
1000.0000 mg | ORAL_TABLET | Freq: Once | ORAL | Status: AC
  Administered 2020-01-09: 1000 mg via ORAL
  Filled 2020-01-09: qty 2

## 2020-01-09 MED ORDER — FENTANYL CITRATE (PF) 100 MCG/2ML IJ SOLN: 25.0000 ug | INTRAMUSCULAR | Status: DC | PRN

## 2020-01-09 MED ORDER — EPINEPHRINE PF 1 MG/ML IJ SOLN
INTRAMUSCULAR | Status: AC
  Filled 2020-01-09: qty 2

## 2020-01-09 MED ORDER — FENTANYL CITRATE (PF) 100 MCG/2ML IJ SOLN
INTRAMUSCULAR | Status: DC | PRN
Start: 2020-01-09 — End: 2020-01-09
  Administered 2020-01-09 (×5): 25 ug via INTRAVENOUS

## 2020-01-09 MED ORDER — OXYCODONE HCL 5 MG PO TABS: 5.0000 mg | ORAL_TABLET | Freq: Four times a day (QID) | ORAL | 0 refills | Status: AC | PRN

## 2020-01-09 MED ORDER — LACTATED RINGERS IV SOLN: INTRAVENOUS | Status: DC

## 2020-01-09 MED ORDER — PROPOFOL 10 MG/ML IV BOLUS
INTRAVENOUS | Status: DC | PRN
  Administered 2020-01-09: 130 mg via INTRAVENOUS

## 2020-01-09 MED ORDER — LACTATED RINGERS IV SOLN
INTRAVENOUS | Status: DC | PRN
  Administered 2020-01-09: 6000 mL

## 2020-01-09 MED ORDER — CHLORHEXIDINE GLUCONATE 0.12 % MT SOLN
15.0000 mL | Freq: Once | OROMUCOSAL | Status: AC
  Administered 2020-01-09: 15 mL via OROMUCOSAL

## 2020-01-09 MED ORDER — LIDOCAINE 2% (20 MG/ML) 5 ML SYRINGE
INTRAMUSCULAR | Status: AC
  Filled 2020-01-09: qty 5

## 2020-01-09 MED ORDER — KETOROLAC TROMETHAMINE 10 MG PO TABS: 10.0000 mg | ORAL_TABLET | Freq: Four times a day (QID) | ORAL | 0 refills | Status: DC | PRN

## 2020-01-09 MED ORDER — CLONIDINE HCL (ANALGESIA) 100 MCG/ML EP SOLN
EPIDURAL | Status: DC | PRN
  Administered 2020-01-09: 50 ug

## 2020-01-09 MED ORDER — BUPIVACAINE-EPINEPHRINE (PF) 0.5% -1:200000 IJ SOLN
INTRAMUSCULAR | Status: DC | PRN
  Administered 2020-01-09: 5 mL

## 2020-01-09 MED ORDER — DEXAMETHASONE SODIUM PHOSPHATE 10 MG/ML IJ SOLN
INTRAMUSCULAR | Status: AC
  Filled 2020-01-09: qty 1

## 2020-01-09 SURGICAL SUPPLY — 38 items
ABLATOR ASPIRATE 50D MULTI-PRT (SURGICAL WAND) ×2 IMPLANT
BLADE SHAVER TORPEDO 4X13 (MISCELLANEOUS) ×2 IMPLANT
BNDG CMPR MED 10X6 ELC LF (GAUZE/BANDAGES/DRESSINGS) ×1
BNDG COHESIVE 2X5 TAN STRL LF (GAUZE/BANDAGES/DRESSINGS) ×2 IMPLANT
BNDG ELASTIC 6X10 VLCR STRL LF (GAUZE/BANDAGES/DRESSINGS) ×2 IMPLANT
BNDG ELASTIC 6X5.8 VLCR STR LF (GAUZE/BANDAGES/DRESSINGS) ×3 IMPLANT
BOOTIES KNEE HIGH SLOAN (MISCELLANEOUS) ×6 IMPLANT
CANNULA ACCUPORT 11G X 120 (CANNULA) ×2 IMPLANT
CLOSURE WOUND 1/2 X4 (GAUZE/BANDAGES/DRESSINGS) ×1
COVER SURGICAL LIGHT HANDLE (MISCELLANEOUS) ×3 IMPLANT
COVER WAND RF STERILE (DRAPES) ×3 IMPLANT
DRAPE C-ARM 42X120 X-RAY (DRAPES) ×2 IMPLANT
DRAPE C-ARMOR (DRAPES) ×2 IMPLANT
DRAPE SHEET LG 3/4 BI-LAMINATE (DRAPES) ×2 IMPLANT
DRSG PAD ABDOMINAL 8X10 ST (GAUZE/BANDAGES/DRESSINGS) ×3 IMPLANT
DURAPREP 26ML APPLICATOR (WOUND CARE) ×3 IMPLANT
GAUZE SPONGE 4X4 12PLY STRL (GAUZE/BANDAGES/DRESSINGS) ×3 IMPLANT
GLOVE BIOGEL PI IND STRL 7.5 (GLOVE) ×1 IMPLANT
GLOVE BIOGEL PI INDICATOR 7.5 (GLOVE) ×2
GLOVE SURG SS PI 7.5 STRL IVOR (GLOVE) ×3 IMPLANT
GLOVE SURG SS PI 8.0 STRL IVOR (GLOVE) ×3 IMPLANT
GOWN STRL REUS W/TWL XL LVL3 (GOWN DISPOSABLE) ×6 IMPLANT
KIT MIXER ACCUMIX (KITS) ×2 IMPLANT
KIT TURNOVER KIT A (KITS) IMPLANT
KNEE KIT SCP W/SIDE ACCUPORT (Joint) ×2 IMPLANT
MANIFOLD NEPTUNE II (INSTRUMENTS) ×3 IMPLANT
PACK ARTHROSCOPY WL (CUSTOM PROCEDURE TRAY) ×3 IMPLANT
PADDING CAST COTTON 6X4 STRL (CAST SUPPLIES) ×3 IMPLANT
PENCIL SMOKE EVACUATOR (MISCELLANEOUS) IMPLANT
PORT APPOLLO RF 90DEGREE MULTI (SURGICAL WAND) IMPLANT
STRIP CLOSURE SKIN 1/2X4 (GAUZE/BANDAGES/DRESSINGS) ×1 IMPLANT
SUT ETHILON 4 0 PS 2 18 (SUTURE) ×3 IMPLANT
TOWEL OR 17X26 10 PK STRL BLUE (TOWEL DISPOSABLE) ×3 IMPLANT
TUBING ARTHROSCOPY IRRIG 16FT (MISCELLANEOUS) ×3 IMPLANT
TUBING CONNECTING 10 (TUBING) ×1 IMPLANT
TUBING CONNECTING 10' (TUBING) ×1
WIPE CHG CHLORHEXIDINE 2% (PERSONAL CARE ITEMS) ×3 IMPLANT
WRAP KNEE MAXI GEL POST OP (GAUZE/BANDAGES/DRESSINGS) ×3 IMPLANT

## 2020-01-09 NOTE — Transfer of Care (Signed)
Immediate Anesthesia Transfer of Care Note  Patient: Kimberly Huang  Procedure(s) Performed: Left knee arthroscopy,partial medial meniscectomy, debridement, subchondroplasty left proximal tibia (Left Knee)  Patient Location: PACU  Anesthesia Type:GA combined with regional for post-op pain  Level of Consciousness: drowsy  Airway & Oxygen Therapy: Patient Spontanous Breathing and Patient connected to face mask oxygen  Post-op Assessment: Report given to RN and Post -op Vital signs reviewed and stable  Post vital signs: Reviewed and stable  Last Vitals:  Vitals Value Taken Time  BP 139/79 01/09/20 1042  Temp    Pulse 85 01/09/20 1043  Resp 16 01/09/20 1043  SpO2 99 % 01/09/20 1043  Vitals shown include unvalidated device data.  Last Pain:  Vitals:   01/09/20 0705  TempSrc: Oral         Complications: No complications documented.

## 2020-01-09 NOTE — Interval H&P Note (Signed)
History and Physical Interval Note:  01/09/2020 8:20 AM  Kimberly Huang  has presented today for surgery, with the diagnosis of Medial meniscus tear, degenerative joint disease, subchondral fracture left knee.  The various methods of treatment have been discussed with the patient and family. After consideration of risks, benefits and other options for treatment, the patient has consented to  Procedure(s): Left knee arthroscopy,partial medial meniscectomy, debridement, subchondroplasty left proximal tibia (Left) as a surgical intervention.  The patient's history has been reviewed, patient examined, no change in status, stable for surgery.  I have reviewed the patient's chart and labs.  Questions were answered to the patient's satisfaction.     Johnn Hai

## 2020-01-09 NOTE — Anesthesia Preprocedure Evaluation (Signed)
Anesthesia Evaluation  Patient identified by MRN, date of birth, ID band Patient awake    Reviewed: Allergy & Precautions, NPO status , Patient's Chart, lab work & pertinent test results  Airway Mallampati: II  TM Distance: >3 FB Neck ROM: Full    Dental  (+) Dental Advisory Given   Pulmonary Current Smoker,    breath sounds clear to auscultation       Cardiovascular hypertension, Pt. on medications and Pt. on home beta blockers + CAD, + Past MI and + Cardiac Stents   Rhythm:Regular Rate:Normal     Neuro/Psych negative neurological ROS     GI/Hepatic negative GI ROS, Neg liver ROS,   Endo/Other  negative endocrine ROS  Renal/GU negative Renal ROS     Musculoskeletal  (+) Arthritis ,   Abdominal   Peds  Hematology negative hematology ROS (+)   Anesthesia Other Findings   Reproductive/Obstetrics                             Anesthesia Physical Anesthesia Plan  ASA: III  Anesthesia Plan: General   Post-op Pain Management:    Induction: Intravenous  PONV Risk Score and Plan: 2 and Ondansetron, Dexamethasone and Treatment may vary due to age or medical condition  Airway Management Planned: LMA  Additional Equipment: None  Intra-op Plan:   Post-operative Plan: Extubation in OR  Informed Consent: I have reviewed the patients History and Physical, chart, labs and discussed the procedure including the risks, benefits and alternatives for the proposed anesthesia with the patient or authorized representative who has indicated his/her understanding and acceptance.     Dental advisory given  Plan Discussed with: CRNA  Anesthesia Plan Comments:         Anesthesia Quick Evaluation

## 2020-01-09 NOTE — Discharge Instructions (Signed)
ARTHROSCOPIC KNEE SURGERY HOME CARE INSTRUCTIONS   PAIN You will be expected to have a moderate amount of pain in the affected knee for approximately two weeks.  However, the first two to four days will be the most severe in terms of the pain you will experience.  Prescriptions have been provided for you to take as needed for the pain.  The pain can be markedly reduced by using the ice/compressive bandage given.  Exchange the ice packs whenever they thaw.  During the night, keep the bandage on because it will still provide some compression for the swelling.  Also, keep the leg elevated on pillows above your heart, and this will help alleviate the pain and swelling.  MEDICATION Prescriptions have been provided to take as needed for pain. To prevent blood clots, take Aspirin 325mg  daily with a meal if not on a blood thinner and if no history of stomach ulcers.  ACTIVITY It is preferred that you stay on bedrest for approximately 24 hours.  However, you may go to the bathroom with help.  After this, you can start to be up and about progressively more.  Remember that the swelling may still increase after three to four days if you are up and doing too much.  You may put as much weight on the affected leg as pain will allow.  Use your crutches for comfort and safety.  However, as soon as you are able, you may discard the crutches and go without them.   DRESSING Keep the current dressing as dry as possible.  Two days after your surgery, you may remove the ice/compressive wrap, and surgical dressing.  You may now take a shower, but do not scrub the sounds directly with soap.  Let water rinse over these and gently wipe with your hand.  Reapply band-aids over the puncture wounds and more gauze if needed.  A slight amount of thin drainage can be normal at this time, and do not let it frighten you.  Reapply the ice/compressive wrap.  You may now repeat this every day each time you shower.  SYMPTOMS TO REPORT TO  YOUR DOCTOR  -Extreme pain.  -Extreme swelling.  -Temperature above 101 degrees that does not come down with acetaminophen     (Tylenol).  -Any changes in the feeling, color or movement of your toes.  -Extreme redness, heat, swelling or drainage at your incision  EXERCISE It is preferred that you begin to exercise on the day of your surgery.  Straight leg raises and short arc quads should be begun the afternoon or evening of surgery and continued until you come back for your follow-up appointment.   Attached is an instruction sheet on how to perform these two simple exercises.  Do these at least three times per day if not more.  You may bend your knee as much as is comfortable.  The puncture wounds may occasionally be slightly uncomfortable with bending of the knee.  Do not let this frighten you.  It is important to keep your knee motion, but do not overdo it.  If you have significant pain, simply do not bend the knee as far.   You will be given more exercises to perform at your first return visit.    RETURN APPOINTMENT 10-14 days from your surgery.  Patient Signature:  ________________________________________________________  Nurse's Signature:  ________________________________________________________              Straight Leg Raise    Tighten stomach and slowly  raise locked right leg ____ inches from floor. Repeat ____ times per set. Do ____ sets per session. Do ____ sessions per day.  http://orth.exer.us/1103   Copyright  VHI. All rights reserved.   Short Arc Knee Extension    Place bolster under left knee so it is bent slightly. Squeeze pelvic floor and hold. Straighten knee. Hold for ___ seconds. Relax for ___ seconds. Repeat ___ times. Do ___ times a day. Repeat with other leg.    Copyright  VHI. All rights reserved.

## 2020-01-09 NOTE — Anesthesia Procedure Notes (Signed)
Procedure Name: LMA Insertion Date/Time: 01/09/2020 8:41 AM Performed by: Sharlette Dense, CRNA Patient Re-evaluated:Patient Re-evaluated prior to induction Oxygen Delivery Method: Circle system utilized Preoxygenation: Pre-oxygenation with 100% oxygen Induction Type: IV induction Ventilation: Mask ventilation without difficulty LMA: LMA inserted LMA Size: 4.0 Number of attempts: 1 Placement Confirmation: positive ETCO2 and breath sounds checked- equal and bilateral Tube secured with: Tape Dental Injury: Teeth and Oropharynx as per pre-operative assessment

## 2020-01-09 NOTE — Anesthesia Procedure Notes (Signed)
Anesthesia Regional Block: Adductor canal block   Pre-Anesthetic Checklist: ,, timeout performed, Correct Patient, Correct Site, Correct Laterality, Correct Procedure, Correct Position, site marked, Risks and benefits discussed,  Surgical consent,  Pre-op evaluation,  At surgeon's request and post-op pain management  Laterality: Left  Prep: chloraprep       Needles:  Injection technique: Single-shot  Needle Type: Echogenic Needle     Needle Length: 9cm  Needle Gauge: 21     Additional Needles:   Procedures:,,,, ultrasound used (permanent image in chart),,,,  Narrative:  Start time: 01/09/2020 8:18 AM End time: 01/09/2020 8:23 AM Injection made incrementally with aspirations every 5 mL.  Performed by: Personally  Anesthesiologist: Suzette Battiest, MD

## 2020-01-09 NOTE — Anesthesia Postprocedure Evaluation (Signed)
Anesthesia Post Note  Patient: CELESE BANNER  Procedure(s) Performed: Left knee arthroscopy,partial medial meniscectomy, debridement, subchondroplasty left proximal tibia (Left Knee)     Patient location during evaluation: PACU Anesthesia Type: General Level of consciousness: awake and alert Pain management: pain level controlled Vital Signs Assessment: post-procedure vital signs reviewed and stable Respiratory status: spontaneous breathing, nonlabored ventilation, respiratory function stable and patient connected to nasal cannula oxygen Cardiovascular status: blood pressure returned to baseline and stable Postop Assessment: no apparent nausea or vomiting Anesthetic complications: no   No complications documented.  Last Vitals:  Vitals:   01/09/20 1130 01/09/20 1145  BP: 134/73 139/78  Pulse: 78 73  Resp: 15 14  Temp: 36.8 C   SpO2: 94% 97%    Last Pain:  Vitals:   01/09/20 1145  TempSrc:   PainSc: 0-No pain                 Tiajuana Amass

## 2020-01-09 NOTE — Brief Op Note (Signed)
01/09/2020  10:36 AM  PATIENT:  Kimberly Huang  69 y.o. female  PRE-OPERATIVE DIAGNOSIS:  Medial meniscus tear, degenerative joint disease, subchondral fracture left knee  POST-OPERATIVE DIAGNOSIS:  Medial meniscus tear, degenerative joint disease, subchondral fracture left knee  PROCEDURE:  Procedure(s): Left knee arthroscopy,partial medial meniscectomy, debridement, subchondroplasty left proximal tibia (Left)  SURGEON:  Surgeon(s) and Role:    Susa Day, MD - Primary  PHYSICIAN ASSISTANT:   ASSISTANTS: [place].   ANESTHESIA:   general  EBL:  25 mL   BLOOD ADMINISTERED:none  DRAINS: none   LOCAL MEDICATIONS USED:  MARCAINE     SPECIMEN:  No Specimen  DISPOSITION OF SPECIMEN:  N/A  COUNTS:  YES  TOURNIQUET:  * No tourniquets in log *  DICTATION: .Other Dictation: Dictation Number  [id number]  PLAN OF CARE: Discharge to home after PACU  PATIENT DISPOSITION:  PACU - hemodynamically stable.   Delay start of Pharmacological VTE agent (>24hrs) due to surgical blood loss or risk of bleeding: no

## 2020-01-09 NOTE — Op Note (Signed)
NAME: JENALEE, TREVIZO MEDICAL RECORD ZO:[id number] ACCOUNT [id number] DATE OF BIRTH:1951-02-15 FACILITY: [facility] LOCATION: [facility] PHYSICIAN:Dakai Braithwaite Windy Kalata, MD  OPERATIVE REPORT  DATE OF PROCEDURE:  01/09/2020  PREOPERATIVE DIAGNOSES: 1.  Medial meniscus tear, left knee. 2.  Subchondral insufficiency fracture. 3.  Radial tear lateral meniscus.  POSTOPERATIVE DIAGNOSES:   1.  Medial meniscus tear, left knee. 2.  Subchondral insufficiency fracture. 3.  Radial tear lateral meniscus. 4.  Grade III chondromalacia of the patella.  PROCEDURE PERFORMED: 1.  Left knee arthroscopy. 2.  Partial medial and lateral meniscectomy. 3.  Chondroplasty medial femoral condyle, medial tibial plateau, patella, femoral sulcus. 4.  Subchondroplasty, medial tibial plateau.  ANESTHESIA:  General.  SURGEON:  Susa Day, MD  ASSISTANT:  Lacie Draft, PA  HISTORY:  This is a 69 year old female who is having knee pain, injury with meniscus tear and a subchondral fracture of the medial tibial plateau insufficiency.  She had significant pain and mechanical symptoms.  She only had mild medial joint space  narrowing.  She was indicated for knee arthroscopy, partial meniscectomy and debridement.  Also, a subchondroplasty to reinforce proximal medial tibial plateau that was a pain generator.  We discussed the risks and benefits including bleeding, infection,  damage to neurovascular structures, no change in symptoms, worsening symptoms, DVT, PE, anesthetic complications, need for total knee replacement in the future.  DESCRIPTION OF PROCEDURE:  With the patient in supine position, after induction of adequate general anesthesia, 2 g Kefzol, the left lower extremity was prepped and draped in the usual sterile fashion.  We had a bump underneath the left hip.  After  appropriate timeout, a lateral parapatellar portal was fashioned with a #11 blade.  Ingress cannula atraumatically placed.  Irrigant was  utilized to insufflate the joint 65 mmHg.  Under direct visualization, a medial parapatellar port was fashioned with  a #11 blade after localization with an 18-gauge needle, sparing the medial meniscus.  Noted was extensive grade III changes of the medial compartment, a complex tear of the posterior third of the medial meniscus.  There were grade III changes of the  proximal tibial plateau noted as well.  We introduced a shaver and performed light chondroplasties of the femoral condyle and tibial plateau.  We performed a partial medial meniscectomy with a combination of upbiting basket, rongeur, shaver and an  ArthroWand.  Near majority of the posterior third was torn and macerated.  The residual approximately 20% was cauterized with an ArthroWand.  A portion of the mid-meniscus was still attached to the capsule.  This was beveled to prevent impingement.  The  remnant was stable to probe palpation.  Suprapatellar pouch was reviewed, extensive grade III changes of the patella.  Light chondroplasty was performed here performed here and the end of the femoral sulcus.  There was normal patellofemoral tracking.  Gutters were unremarkable.  The lateral compartment revealed a discoid lateral meniscus with a radial tear laterally.  I introduced a shaver and debrided it to a stable base.  No significant degenerative changes were noted in the lateral compartment.  Next, all compartments were evaluated.  No further pathology amenable to arthroscopic intervention.  I removed all instrumentation.  We placed temporary Steri-Strips over the portals.  The knee was then brought into extension.  We kept all arthroscopic  equipment was sterile.  We brought in our C-arm.  A bone foam was placed under the left knee.  In the AP and lateral plane, we obtained a true AP and  a true lateral.  We then proceeded with our chondroplasty.  Review of the MRI in the AP, lateral and  axial planes  indicated that the subchondral lesion  was under the medial tibial plateau approximately 1.5 cm from the medial border in the anterior third.  I made a small stab wound with a #11 blade and I advanced the cannulated cannula to the cortex of  the tibia in AP and lateral plane.  I made this parallel.  We drilled into the subchondral region approximately 4 mm from the plateau surface and just past the lesion and the involvement of edema on the MRI.  We removed the cannula.  We first filled up  the cannula with 3 mL of the hydroxyapatite calcium phosphate solution.  This was then injected slowly and without difficulty or significant pressure.  The fenestrations were directed cephalad, then rotated to 90 degrees anteriorly and then inferiorly.   Following this, we performed the same procedure with 2 additional syringes of 3 mL of the cement.  We were unable to direct it cephalad as much as we were inferiorly.  It seemed to flow without resistance inferiorly and slightly anteriorly.  Posteriorly,  there was no flow either, so we did not inject cement with significant pressure.  This was watched in the AP and lateral planes on C-arm and we found this satisfactorily covered the region of edema noted on the MRI.  After appropriate curing of the  cement for 10 minutes, we then removed the cannula.  There was no extravasation that was significant noted at all and we felt we obtained the coverage noted within the deficiency noted on the MRI in the AP and lateral plane.  Following this, then we  removed the C-arm and replaced our arthroscopic camera just for inspection, insufflated at 65 mmHg.  I inspected the medial compartment and palpated it.  There was no evidence of extravasation of cement within the joint.  After copious lavage, then all  instrumentation was removed.  Portals were closed with 4-0 nylon simple sutures.  Marcaine 0.25% with epinephrine was infiltrated in the joint.  Wound was dressed sterilely.  Awoken without difficulty and transported to  the recovery room in satisfactory  condition.  The patient tolerated the procedure well.  No complications.  [place], [place].  VN/NUANCE  D:01/09/2020 T:01/09/2020 JOB:[id number]

## 2020-01-10 ENCOUNTER — Encounter (HOSPITAL_COMMUNITY): Payer: Self-pay | Admitting: Specialist

## 2020-01-19 ENCOUNTER — Other Ambulatory Visit: Payer: Self-pay | Admitting: Cardiology

## 2020-03-14 ENCOUNTER — Other Ambulatory Visit: Payer: Self-pay | Admitting: Cardiology

## 2020-03-14 LAB — BASIC METABOLIC PANEL
BUN/Creatinine Ratio: 12 (ref 12–28)
BUN: 8 mg/dL (ref 8–27)
CO2: 23 mmol/L (ref 20–29)
Calcium: 9 mg/dL (ref 8.7–10.3)
Chloride: 104 mmol/L (ref 96–106)
Creatinine, Ser: 0.67 mg/dL (ref 0.57–1.00)
GFR calc Af Amer: 102 mL/min/{1.73_m2} (ref >59)
GFR calc non Af Amer: 89 mL/min/{1.73_m2} (ref >59)
Glucose: 107 mg/dL — ABNORMAL HIGH (ref 65–99)
Potassium: 4.7 mmol/L (ref 3.5–5.2)
Sodium: 142 mmol/L (ref 134–144)

## 2020-03-14 LAB — HEPATIC FUNCTION PANEL
ALT: 22 IU/L (ref 0–32)
AST: 18 IU/L (ref 0–40)
Albumin: 3.9 g/dL (ref 3.7–4.7)
Alkaline Phosphatase: 116 IU/L (ref 44–121)
Bilirubin Total: 0.4 mg/dL (ref 0.0–1.2)
Bilirubin, Direct: 0.11 mg/dL (ref 0.00–0.40)
Total Protein: 6.6 g/dL (ref 6.0–8.5)

## 2020-03-14 LAB — LIPID PANEL
Chol/HDL Ratio: 3.4 ratio (ref 0.0–4.4)
Cholesterol, Total: 162 mg/dL (ref 100–199)
HDL: 47 mg/dL (ref >39)
LDL Chol Calc (NIH): 94 mg/dL (ref 0–99)
Triglycerides: 117 mg/dL (ref 0–149)
VLDL Cholesterol Cal: 21 mg/dL (ref 5–40)

## 2020-03-21 ENCOUNTER — Other Ambulatory Visit: Payer: Self-pay | Admitting: *Deleted

## 2020-03-21 ENCOUNTER — Telehealth: Payer: Self-pay | Admitting: Cardiology

## 2020-03-21 DIAGNOSIS — E785 Hyperlipidemia, unspecified: Secondary | ICD-10-CM

## 2020-03-21 MED ORDER — EZETIMIBE 10 MG PO TABS: 10.0000 mg | ORAL_TABLET | Freq: Every day | ORAL | 3 refills | Status: DC

## 2020-03-21 NOTE — Telephone Encounter (Signed)
The following abnormalities are noted: chemistries are normal. LDL 94 is not at goal of <70.   All other values are normal, stable or within acceptable limits. Medication changes / Follow up labs / Other changes or recommendations:  She is on lipitor 80 mg daily. I would add Zetia 10 mg daily and repeat lipids and HFP in 3 months

## 2020-03-21 NOTE — Telephone Encounter (Signed)
Pt aware .Kimberly Huang

## 2020-03-21 NOTE — Telephone Encounter (Signed)
Lm to call back ./cy 

## 2020-03-21 NOTE — Telephone Encounter (Signed)
Patient returning call for lab results. 

## 2020-04-03 ENCOUNTER — Other Ambulatory Visit: Payer: Self-pay

## 2020-04-03 DIAGNOSIS — E785 Hyperlipidemia, unspecified: Secondary | ICD-10-CM

## 2020-04-03 DIAGNOSIS — I2510 Atherosclerotic heart disease of native coronary artery without angina pectoris: Secondary | ICD-10-CM

## 2020-04-09 ENCOUNTER — Other Ambulatory Visit: Payer: Self-pay | Admitting: Cardiology

## 2020-08-08 ENCOUNTER — Other Ambulatory Visit: Payer: Self-pay | Admitting: Cardiology

## 2020-11-08 ENCOUNTER — Other Ambulatory Visit: Payer: Self-pay | Admitting: Cardiology

## 2020-11-28 NOTE — Progress Notes (Signed)
Cardiology Office Note    Date:  12/08/2020   ID:  Kimberly Huang, DOB Feb 24, 1951, MRN [id number]  PCP:  Patient, No Pcp Per (Inactive)  Cardiologist:  Dr. [name]  Chief Complaint  Patient presents with   Coronary Artery Disease    History of Present Illness:  Kimberly Huang is a 70 y.o. female with past medical history of hypertension, hyperlipidemia, tobacco abuse and CAD.  Patient presented in October 2018 with chest pain and a posterior STEMI.  She ultimately underwent DES to her left circumflex, she also had residual RCA disease and that was intervened 48 hours later.  Ejection fraction was preserved on LV gram.    In September 2021 she underwent arthroscopic left knee surgery. She is still experiencing knee pain and may need TKR.   She is doing well from a cardiac standpoint. No significant chest pain.  Denies any dyspnea, palpitations or edema.  Rare "twinges". Activity limited by arthritis.   Past Medical History:  Diagnosis Date   Arthritis    knee, hands   CAD S/P PCI OM1 99%-0%; Plan Staged PCI mRCA 90%. 02/19/2017   1. Severe 2 vessel obstructive CAD    - 99% thrombotic occlusion of first OM. This is a bifurcating vessel.     - 90% mid RCA-segmental 2. Good overall LV dysfunction with lateral HK 3. Moderately elevated LVEDP 4. Successful stenting of the first OM with DES. Distal embolization into the distal lateral OM branch.   Plan: DAPT for one year. Will treat with IV Aggrastat for 18 hours. Start high dose statin, beta blocker. IV Ntg for BP control acutely. Plan for stage PCI of RCA on Monday 15/40 if no complication.   Essential hypertension 02/21/2017   Hyperlipidemia with target LDL less than 70 02/21/2017   Presence of drug-eluting stent in L Cx: OM1 (Xience SIERRA DES 3.5 x 15) 02/21/2017   STEMI involving left circumflex coronary artery ([name]) 02/19/2017   Tobacco abuse 02/19/2017    Past Surgical History:  Procedure Laterality Date   CHOLECYSTECTOMY      CORONARY STENT INTERVENTION N/A 02/19/2017   Procedure: CORONARY STENT INTERVENTION;  Surgeon: Kimberly Huang, Kimberly Nolet M, MD;  Location: [name];  Service: Cardiovascular;  Laterality: N/A;   CORONARY STENT INTERVENTION N/A 02/21/2017   Procedure: CORONARY STENT INTERVENTION;  Surgeon: Belva Crome, MD;  Location: [name];  Service: Cardiovascular;  Laterality: N/A;   KNEE ARTHROSCOPY Right 2010   KNEE ARTHROSCOPY WITH SUBCHONDROPLASTY Left 01/09/2020   Procedure: Left knee arthroscopy,partial medial meniscectomy, debridement, subchondroplasty left proximal tibia;  Surgeon: Kimberly Day, MD;  Location: [name];  Service: Orthopedics;  Laterality: Left;   LEFT HEART CATH AND CORONARY ANGIOGRAPHY N/A 02/19/2017   Procedure: LEFT HEART CATH AND CORONARY ANGIOGRAPHY;  Surgeon: Kimberly Huang, Kimberly Oddo M, MD;  Location: [name];  Service: Cardiovascular;  Laterality: N/A;   tonsillectomy     TUBAL LIGATION      Current Medications: Outpatient Medications Prior to Visit  Medication Sig Dispense Refill   acetaminophen (TYLENOL) 500 MG tablet Take 500-1,000 mg by mouth in the morning and at bedtime.     aspirin 81 MG chewable tablet Chew 1 tablet (81 mg total) by mouth daily.     atorvastatin (LIPITOR) 80 MG tablet TAKE 1 TABLET BY MOUTH DAILY AT 6 PM. 90 tablet 0   Coenzyme Q10 (CO Q 10) 100 MG CAPS Take 100-200 mg by mouth daily in the afternoon.  diclofenac Sodium (VOLTAREN) 1 % GEL Apply 2 g topically daily as needed (Knee pain).      irbesartan (AVAPRO) 300 MG tablet TAKE 1 TABLET BY MOUTH EVERY Huang 90 tablet 2   metoprolol tartrate (LOPRESSOR) 25 MG tablet TAKE 1 TABLET BY MOUTH TWICE A Huang 180 tablet 3   nitroGLYCERIN (NITROSTAT) 0.4 MG SL tablet Place 1 tablet (0.4 mg total) under the tongue every 5 (five) minutes x 3 doses as needed for chest pain. 25 tablet 2   Polyethyl Glycol-Propyl Glycol (SYSTANE FREE OP) Place 2 drops into both eyes daily as needed (Floaters).       trolamine salicylate (ASPERCREME) 10 % cream Apply 1 application topically daily as needed (Knee pain).     ezetimibe (ZETIA) 10 MG tablet Take 1 tablet (10 mg total) by mouth daily. 90 tablet 3   ketorolac (TORADOL) 10 MG tablet Take 1 tablet (10 mg total) by mouth every 6 (six) hours as needed. (Patient not taking: Reported on 12/08/2020) 20 tablet 0   No facility-administered medications prior to visit.     Allergies:   Codeine   Social History   Socioeconomic History   Marital status: Married    Spouse name: Not on file   Number of children: 3   Years of education: Not on file   Highest education level: Not on file  Occupational History   Not on file  Tobacco Use   Smoking status: Some Days    Packs/Huang: 1.00    Types: Cigarettes   Smokeless tobacco: Never   Tobacco comments:    Trying to quit  Vaping Use   Vaping Use: Never used  Substance and Sexual Activity   Alcohol use: Yes    Comment: rare   Drug use: Never   Sexual activity: Not on file  Other Topics Concern   Not on file  Social History Narrative   Family runs the [employer].   Social Determinants of Health   Financial Resource Strain: Not on file  Food Insecurity: Not on file  Transportation Needs: Not on file  Physical Activity: Not on file  Stress: Not on file  Social Connections: Not on file     Family History:  The patient's family history includes Diabetes in her mother; Heart attack in her sister.   ROS:   Please see the history of present illness.    ROS All other systems reviewed and are negative.   PHYSICAL EXAM:   VS:  BP 122/78   Pulse (!) 59   Resp 20   Ht _0  (1.626 Huang)   Wt 200 lb 6.4 oz (90.9 kg)   SpO2 95%   BMI 34.40 kg/Huang    GENERAL:  Well appearing, overweight,,  WF in NAD HEENT:  PERRL, EOMI, sclera are clear. Oropharynx is clear. NECK:  No jugular venous distention, carotid upstroke brisk and symmetric, no bruits, no thyromegaly or adenopathy LUNGS:  Clear to  auscultation bilaterally CHEST:  Unremarkable HEART:  RRR,  PMI not displaced or sustained,S1 and S2 within normal limits, no S3, no S4: no clicks, no rubs, no murmurs ABD:  Soft, nontender. BS +, no masses or bruits. No hepatomegaly, no splenomegaly EXT:  2 + pulses throughout, no edema, no cyanosis no clubbing SKIN:  Warm and dry.  No rashes NEURO:  Alert and oriented x 3. Cranial nerves II through XII intact. PSYCH:  Cognitively intact    Wt Readings from Last 3 Encounters:  12/08/20 200  lb 6.4 oz (90.9 kg)  10/29/19 192 lb (87.1 kg)  02/20/19 194 lb 6.4 oz (88.2 kg)      Studies/Labs Reviewed:   EKG:  EKG is ordered today. NSR with rate 59. 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 No acute change. I have personally reviewed and interpreted this study.    Recent Labs: 01/02/2020: Hemoglobin 14.6; Platelets 250 03/13/2020: ALT 22; BUN 8; Creatinine, Ser 0.67; Potassium 4.7; Sodium 142   Lipid Panel    Component Value Date/Time   CHOL 162 03/13/2020 1204   TRIG 117 03/13/2020 1204   HDL 47 03/13/2020 1204   CHOLHDL 3.4 03/13/2020 1204   CHOLHDL 4.0 02/20/2017 0524   VLDL 16 02/20/2017 0524   LDLCALC 94 03/13/2020 1204    Additional studies/ records that were reviewed today include:   Cath 02/19/2017 Conclusion     Prox LAD to Mid LAD lesion, 20 %stenosed. Prox RCA to Mid RCA lesion, 90 %stenosed. Prox Cx to Mid Cx lesion, 30 %stenosed. The left ventricular systolic function is normal. LV end diastolic pressure is moderately elevated. The left ventricular ejection fraction is 50-55% by visual estimate. 1st Mrg lesion, 99 %stenosed. A STENT SIERRA 3.50 X 15 MM drug eluting stent was successfully placed. Post intervention, there is a 0% residual stenosis. Lat 1st Mrg lesion, 100 %stenosed.   1. Severe 2 vessel obstructive CAD    - 99% thrombotic occlusion of first OM. This is a bifurcating vessel.     - 90% mid RCA-segmental 2. Good overall LV dysfunction with lateral HK 3.  Moderately elevated LVEDP 4. Successful stenting of the first OM with DES. Distal embolization into the distal lateral OM branch.   Plan: DAPT for one year. Will treat with IV Aggrastat for 18 hours. Start high dose statin, beta blocker. IV Ntg for BP control acutely. Plan for stage PCI of RCA on Monday if no complication.      Cath 02/21/2017 Conclusion   Successful stent implantation in the proximal to mid RCA reducing a segmental 90% stenosis to 0% with TIMI grade 3 flow. Stent used was a 3.0 x 26 Onyx post dilated to 14 atm 2.   RECOMMENDATIONS:   Continue dual antiplatelet therapy (aspirin and Brilinta) Discharge per discretion of IC team.       ASSESSMENT:    1. Coronary artery disease involving native coronary artery of native heart without angina pectoris   2. Hyperlipidemia with target LDL less than 70   3. Essential hypertension   4. Tobacco abuse      PLAN:  In order of problems listed above:  CAD: s/p STEMI in October 2018 with DES the LCx and RCA. She is asymptomatic. Continue ASA, lipitor, beta blocker.   Hypertension: Blood pressure is  controlled. Continue current meds  Hyperlipidemia: on high dose lipitor and Zetia. Will update labs today.   Tobacco abuse:  Counseled on complete cessation.   5.   Arthritis left knee. Per Ortho.     Medication Adjustments/Labs and Tests Ordered: Current medicines are reviewed at length with the patient today.  Concerns regarding medicines are outlined above.  Medication changes, Labs and Tests ordered today are listed in the Patient Instructions below. There are no Patient Instructions on file for this visit.    Signed, Nichele Slawson [name], MD  12/08/2020 4:11 PM    [facility] [address], [place], [place]  [address] Phone: [phone]; Fax: [phone]

## 2020-12-04 ENCOUNTER — Other Ambulatory Visit: Payer: Self-pay | Admitting: Cardiology

## 2020-12-08 ENCOUNTER — Other Ambulatory Visit: Payer: Self-pay

## 2020-12-08 ENCOUNTER — Encounter: Payer: Self-pay | Admitting: Cardiology

## 2020-12-08 ENCOUNTER — Ambulatory Visit: Payer: Medicare Other | Admitting: Cardiology

## 2020-12-08 VITALS — BP 122/78 | HR 59 | Resp 20 | Ht 64.0 in | Wt 200.4 lb

## 2020-12-08 DIAGNOSIS — I2510 Atherosclerotic heart disease of native coronary artery without angina pectoris: Secondary | ICD-10-CM

## 2020-12-08 DIAGNOSIS — E785 Hyperlipidemia, unspecified: Secondary | ICD-10-CM

## 2020-12-08 DIAGNOSIS — Z720 Tobacco use: Secondary | ICD-10-CM

## 2020-12-08 DIAGNOSIS — I10 Essential (primary) hypertension: Secondary | ICD-10-CM | POA: Diagnosis not present

## 2020-12-09 LAB — BASIC METABOLIC PANEL
BUN/Creatinine Ratio: 16 (ref 12–28)
BUN: 11 mg/dL (ref 8–27)
CO2: 21 mmol/L (ref 20–29)
Calcium: 9.3 mg/dL (ref 8.7–10.3)
Chloride: 104 mmol/L (ref 96–106)
Creatinine, Ser: 0.7 mg/dL (ref 0.57–1.00)
Glucose: 86 mg/dL (ref 65–99)
Potassium: 4.7 mmol/L (ref 3.5–5.2)
Sodium: 143 mmol/L (ref 134–144)
eGFR: 92 mL/min/{1.73_m2} (ref >59)

## 2020-12-09 LAB — HEPATIC FUNCTION PANEL
ALT: 19 IU/L (ref 0–32)
AST: 21 IU/L (ref 0–40)
Albumin: 4.4 g/dL (ref 3.7–4.7)
Alkaline Phosphatase: 113 IU/L (ref 44–121)
Bilirubin Total: 0.4 mg/dL (ref 0.0–1.2)
Bilirubin, Direct: 0.13 mg/dL (ref 0.00–0.40)
Total Protein: 7.2 g/dL (ref 6.0–8.5)

## 2020-12-09 LAB — LIPID PANEL
Chol/HDL Ratio: 2.5 ratio (ref 0.0–4.4)
Cholesterol, Total: 141 mg/dL (ref 100–199)
HDL: 57 mg/dL (ref >39)
LDL Chol Calc (NIH): 61 mg/dL (ref 0–99)
Triglycerides: 133 mg/dL (ref 0–149)
VLDL Cholesterol Cal: 23 mg/dL (ref 5–40)

## 2020-12-10 ENCOUNTER — Telehealth: Payer: Self-pay | Admitting: Cardiology

## 2020-12-10 NOTE — Telephone Encounter (Signed)
*  STAT* If patient is at the pharmacy, call can be transferred to refill team.   1. Which medications need to be refilled? (please list name of each medication and dose if known) nitroGLYCERIN (NITROSTAT) 0.4 MG SL tablet  2. Which pharmacy/location (including street and city if local pharmacy) is medication to be sent to? [facility] [place],  - [place]  3. Do they need a 30 day or 90 day supply? 30 ds

## 2020-12-12 ENCOUNTER — Other Ambulatory Visit: Payer: Self-pay

## 2020-12-12 MED ORDER — NITROGLYCERIN 0.4 MG SL SUBL: 0.4000 mg | SUBLINGUAL_TABLET | SUBLINGUAL | 2 refills | Status: DC | PRN

## 2021-01-10 ENCOUNTER — Other Ambulatory Visit: Payer: Self-pay | Admitting: Cardiology

## 2021-02-05 ENCOUNTER — Other Ambulatory Visit: Payer: Self-pay | Admitting: Cardiology

## 2021-02-13 ENCOUNTER — Other Ambulatory Visit: Payer: Self-pay | Admitting: Cardiology

## 2021-03-22 ENCOUNTER — Other Ambulatory Visit: Payer: Self-pay | Admitting: Cardiology

## 2021-06-03 ENCOUNTER — Other Ambulatory Visit: Payer: Self-pay | Admitting: Cardiology

## 2021-07-02 ENCOUNTER — Telehealth: Payer: Self-pay

## 2021-07-02 DIAGNOSIS — I2510 Atherosclerotic heart disease of native coronary artery without angina pectoris: Secondary | ICD-10-CM

## 2021-07-02 DIAGNOSIS — M79604 Pain in right leg: Secondary | ICD-10-CM

## 2021-07-02 NOTE — Telephone Encounter (Signed)
Dr.Jordan spoke to patient.She has been having bilateral leg pain.He advised to schedule lower ext arterial dopplers. ?

## 2021-07-24 ENCOUNTER — Other Ambulatory Visit: Payer: Self-pay | Admitting: Cardiology

## 2021-07-24 DIAGNOSIS — I2510 Atherosclerotic heart disease of native coronary artery without angina pectoris: Secondary | ICD-10-CM

## 2021-07-24 DIAGNOSIS — M79604 Pain in right leg: Secondary | ICD-10-CM

## 2021-07-29 ENCOUNTER — Ambulatory Visit (HOSPITAL_COMMUNITY)
Admission: RE | Admit: 2021-07-29 | Discharge: 2021-07-29 | Disposition: A | Discharge status: Home / Self Care | Payer: Medicare Other | Source: Ambulatory Visit | Attending: Cardiology | Admitting: Cardiology

## 2021-07-29 DIAGNOSIS — M79605 Pain in left leg: Secondary | ICD-10-CM | POA: Diagnosis not present

## 2021-07-29 DIAGNOSIS — M79604 Pain in right leg: Secondary | ICD-10-CM | POA: Insufficient documentation

## 2021-07-29 DIAGNOSIS — I2510 Atherosclerotic heart disease of native coronary artery without angina pectoris: Secondary | ICD-10-CM | POA: Diagnosis not present

## 2021-08-29 ENCOUNTER — Other Ambulatory Visit: Payer: Self-pay | Admitting: Cardiology

## 2021-10-03 IMAGING — RF DG KNEE 1-2V*L*
1 series · 4 of 4 positions shown · non-contrast
Comparison: None.

CLINICAL DATA: Intraoperative left knee subchondroplasty

EXAM:
LEFT KNEE - 1-2 VIEW
Fluoroscopy time 1 minutes and 2 seconds

[Series 1: unknown protocol · 0.14mm/px · 4 of 4 slices shown]
[im 1/4]
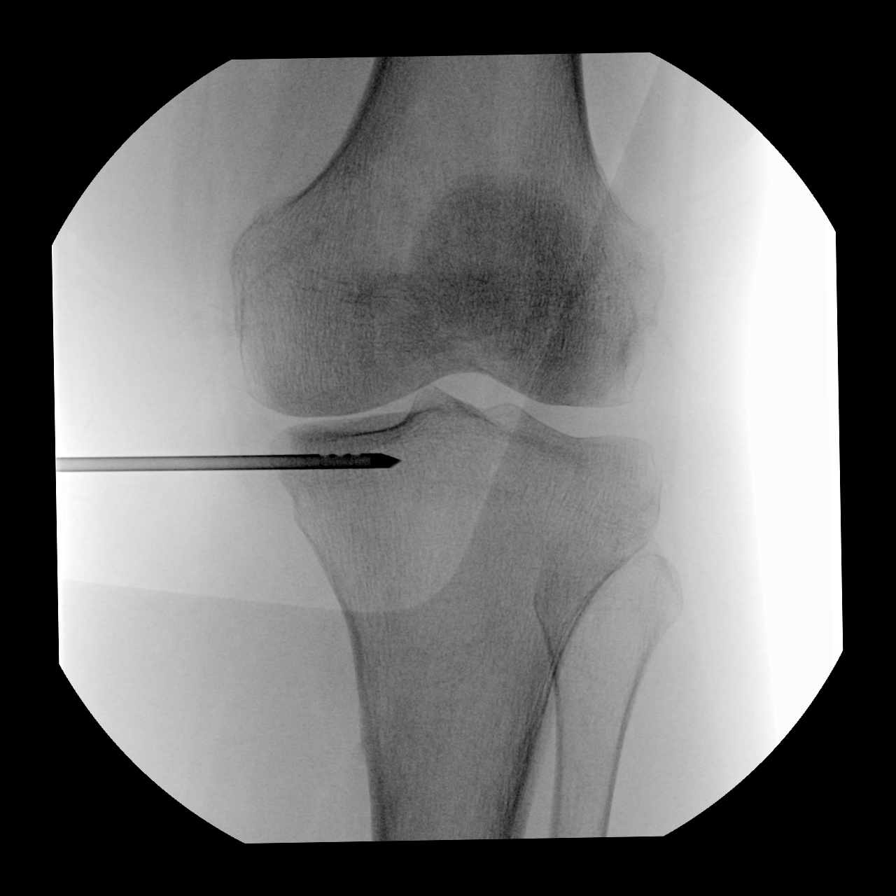
[im 2/4]
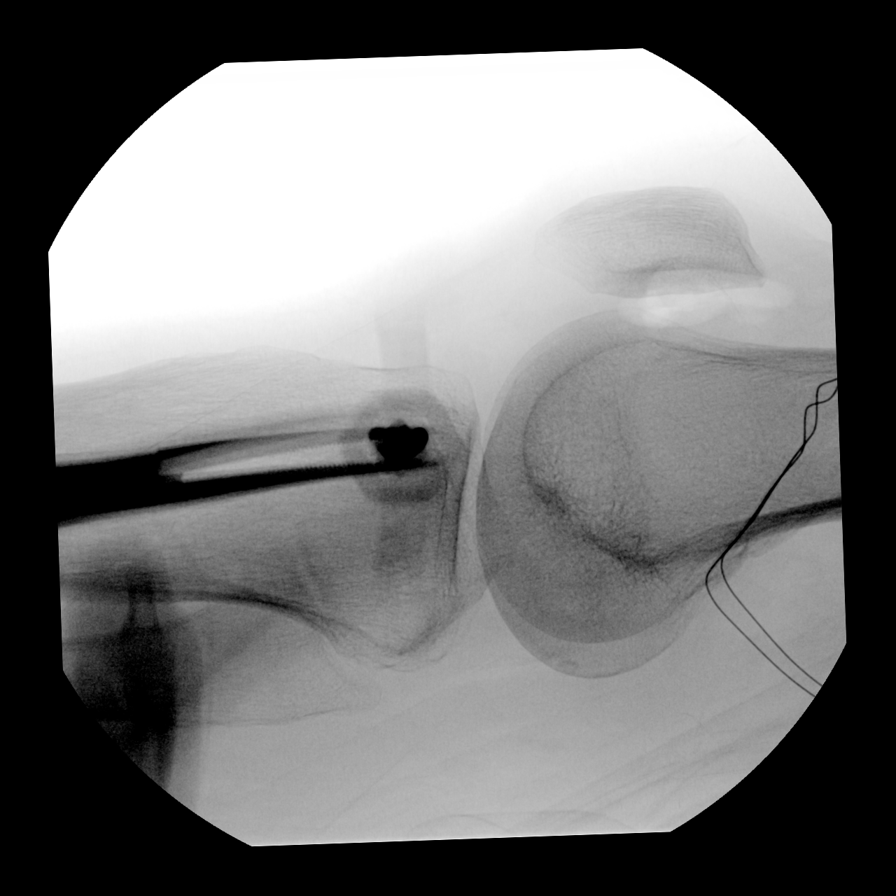
[im 3/4]
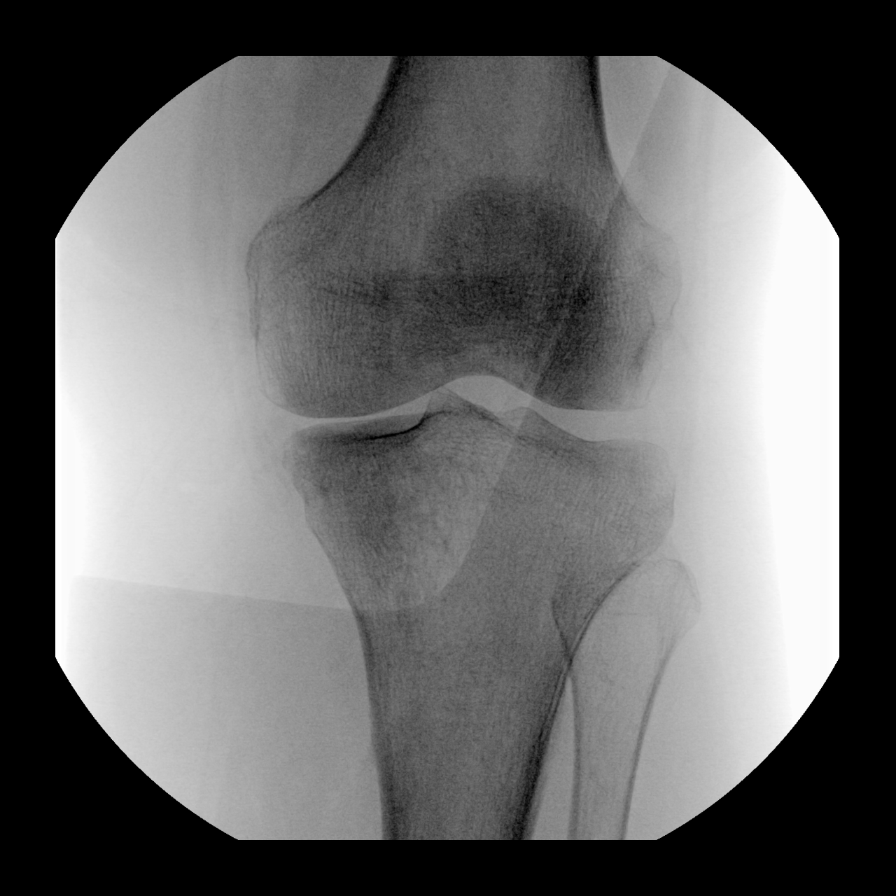
[im 4/4]
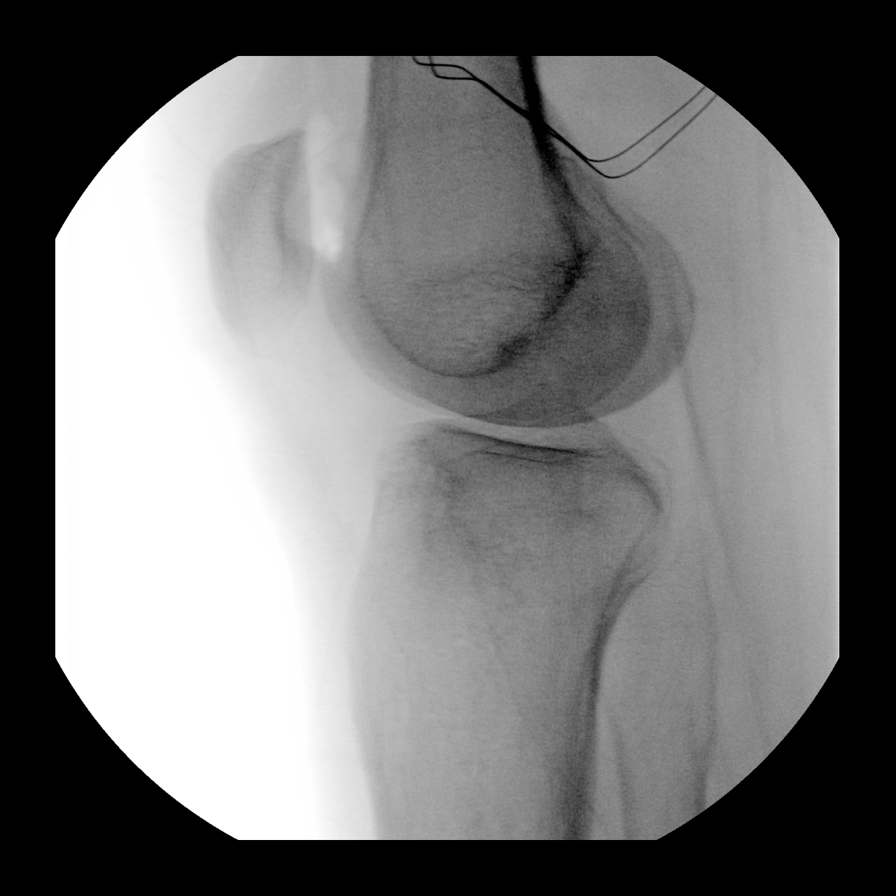

[4 of 4 positions shown; findings below may reference images not displayed]

FINDINGS: Intraoperative fluoroscopic left knee subchondroplasty.
IMPRESSION: Intraoperative fluoroscopic left knee subchondroplasty.

## 2021-10-26 ENCOUNTER — Encounter (HOSPITAL_COMMUNITY): Payer: Self-pay

## 2021-10-26 ENCOUNTER — Other Ambulatory Visit: Payer: Self-pay

## 2021-10-26 ENCOUNTER — Emergency Department (HOSPITAL_COMMUNITY)
Admission: EM | Admit: 2021-10-26 | Discharge: 2021-10-26 | Disposition: A | Discharge status: Home / Self Care | Payer: Medicare Other | Attending: Emergency Medicine | Admitting: Emergency Medicine

## 2021-10-26 ENCOUNTER — Emergency Department (HOSPITAL_COMMUNITY): Payer: Medicare Other

## 2021-10-26 DIAGNOSIS — R911 Solitary pulmonary nodule: Secondary | ICD-10-CM | POA: Diagnosis not present

## 2021-10-26 DIAGNOSIS — I2510 Atherosclerotic heart disease of native coronary artery without angina pectoris: Secondary | ICD-10-CM | POA: Insufficient documentation

## 2021-10-26 DIAGNOSIS — I10 Essential (primary) hypertension: Secondary | ICD-10-CM | POA: Insufficient documentation

## 2021-10-26 DIAGNOSIS — Z79899 Other long term (current) drug therapy: Secondary | ICD-10-CM | POA: Diagnosis not present

## 2021-10-26 DIAGNOSIS — R0781 Pleurodynia: Secondary | ICD-10-CM | POA: Diagnosis present

## 2021-10-26 DIAGNOSIS — F17200 Nicotine dependence, unspecified, uncomplicated: Secondary | ICD-10-CM | POA: Insufficient documentation

## 2021-10-26 DIAGNOSIS — Z7982 Long term (current) use of aspirin: Secondary | ICD-10-CM | POA: Insufficient documentation

## 2021-10-26 LAB — CBC WITH DIFFERENTIAL/PLATELET
Abs Immature Granulocytes: 0.03 10*3/uL (ref 0.00–0.07)
Basophils Absolute: 0.1 10*3/uL (ref 0.0–0.1)
Basophils Relative: 1 %
Eosinophils Absolute: 0.1 10*3/uL (ref 0.0–0.5)
Eosinophils Relative: 2 %
HCT: 42.6 % (ref 36.0–46.0)
Hemoglobin: 14.2 g/dL (ref 12.0–15.0)
Immature Granulocytes: 0 %
Lymphocytes Relative: 30 %
Lymphs Abs: 2.5 10*3/uL (ref 0.7–4.0)
MCH: 30.5 pg (ref 26.0–34.0)
MCHC: 33.3 g/dL (ref 30.0–36.0)
MCV: 91.6 fL (ref 80.0–100.0)
Monocytes Absolute: 0.5 10*3/uL (ref 0.1–1.0)
Monocytes Relative: 6 %
Neutro Abs: 5.1 10*3/uL (ref 1.7–7.7)
Neutrophils Relative %: 61 %
Platelets: 261 10*3/uL (ref 150–400)
RBC: 4.65 MIL/uL (ref 3.87–5.11)
RDW: 13.4 % (ref 11.5–15.5)
WBC: 8.3 10*3/uL (ref 4.0–10.5)
nRBC: 0 % (ref 0.0–0.2)

## 2021-10-26 LAB — COMPREHENSIVE METABOLIC PANEL
ALT: 18 U/L (ref 0–44)
AST: 22 U/L (ref 15–41)
Albumin: 3.8 g/dL (ref 3.5–5.0)
Alkaline Phosphatase: 100 U/L (ref 38–126)
Anion gap: 7 (ref 5–15)
BUN: 11 mg/dL (ref 8–23)
CO2: 25 mmol/L (ref 22–32)
Calcium: 9 mg/dL (ref 8.9–10.3)
Chloride: 106 mmol/L (ref 98–111)
Creatinine, Ser: 1 mg/dL (ref 0.44–1.00)
GFR, Estimated: 60 mL/min — ABNORMAL LOW (ref >60)
Glucose, Bld: 122 mg/dL — ABNORMAL HIGH (ref 70–99)
Potassium: 4.1 mmol/L (ref 3.5–5.1)
Sodium: 138 mmol/L (ref 135–145)
Total Bilirubin: 0.5 mg/dL (ref 0.3–1.2)
Total Protein: 7.3 g/dL (ref 6.5–8.1)

## 2021-10-26 NOTE — ED Triage Notes (Signed)
Patient states she stubbed her shoe on the kitchen floor approx 2 months ago. Patient denies hitting her head or having LOC.  Patient c/o pain on the left torso and left rib cage pain.  Patient states she takes an Aspirin daily

## 2021-11-17 ENCOUNTER — Encounter: Payer: Self-pay | Admitting: Pulmonary Disease

## 2021-11-17 ENCOUNTER — Ambulatory Visit (INDEPENDENT_AMBULATORY_CARE_PROVIDER_SITE_OTHER): Payer: Medicare Other

## 2021-11-17 ENCOUNTER — Ambulatory Visit: Payer: Medicare Other | Admitting: Pulmonary Disease

## 2021-11-17 VITALS — BP 120/80 | HR 62 | Temp 98.2°F | Ht 62.0 in | Wt 193.6 lb

## 2021-11-17 DIAGNOSIS — R911 Solitary pulmonary nodule: Secondary | ICD-10-CM

## 2021-11-17 DIAGNOSIS — R918 Other nonspecific abnormal finding of lung field: Secondary | ICD-10-CM | POA: Diagnosis not present

## 2021-11-17 NOTE — Progress Notes (Signed)
Subjective:   PATIENT ID: Kimberly Huang GENDER: female DOB: 02-23-1951, MRN: [id number]   HPI  Chief Complaint  Patient presents with   Consult    X-ray showed nodule on left lung. Heartburn feeling.    Reason for Visit: New consult for abnormal CXR  Ms. Kimberly Huang is a 71 year old female active smoker with CAD s/p stent, HTN, HLD who presents for abnormal chest imaging.  She reports mechanical fall several weeks ago and had persistent left chest pain. She presented in ED on 10/26/21. CXR demonstrated incidental left upper lobe lung nodule measuring 2.7 cm. CT Chest was recommended however she was unable to stay due to needing to pick up her husband. She scheduled Pulmonary follow-up to further discuss chest XR.  Denies shortness of breath, cough or wheezing. Denies fatigue, unintentional weight loss, night sweats or unexplained fevers/chills. At baseline not active due to knee pain. Normal mammogram and pap smears however last exam before pandemic. She is the caregiver for her husband so has not had time to care for herself in recent years.  Social History: Active smoker. Currently smoking 1.5 ppd. Reports 50 years. She is the caregiver for her husband so has not had time to care for herself in recent years.  I have personally reviewed patient's past medical/family/social history, allergies, current medications.  Past Medical History:  Diagnosis Date   Arthritis    knee, hands   CAD S/P PCI OM1 99%-0%; Plan Staged PCI mRCA 90%. 02/19/2017   1. Severe 2 vessel obstructive CAD    - 99% thrombotic occlusion of first OM. This is a bifurcating vessel.     - 90% mid RCA-segmental 2. Good overall LV dysfunction with lateral HK 3. Moderately elevated LVEDP 4. Successful stenting of the first OM with DES. Distal embolization into the distal lateral OM branch.   Plan: DAPT for one year. Will treat with IV Aggrastat for 18 hours. Start high dose statin, beta blocker. IV Ntg for BP  control acutely. Plan for stage PCI of RCA on Monday 29/52 if no complication.   Essential hypertension 02/21/2017   Hyperlipidemia with target LDL less than 70 02/21/2017   Presence of drug-eluting stent in L Cx: OM1 (Xience SIERRA DES 3.5 x 15) 02/21/2017   STEMI involving left circumflex coronary artery ([place]) 02/19/2017   Tobacco abuse 02/19/2017     Family History  Problem Relation Age of Onset   Diabetes Mother    Heart attack Sister      Social History   Occupational History   Not on file  Tobacco Use   Smoking status: Some Days    Packs/day: 1.50    Years: 50.00    Total pack years: 75.00    Types: Cigarettes   Smokeless tobacco: Never   Tobacco comments:    Trying to quit, currently smokes 0.5 packs a day 11/17/21  Vaping Use   Vaping Use: Never used  Substance and Sexual Activity   Alcohol use: Yes    Comment: rare   Drug use: Never   Sexual activity: Not on file    Allergies  Allergen Reactions   Codeine Nausea Only     Outpatient Medications Prior to Visit  Medication Sig Dispense Refill   acetaminophen (TYLENOL) 500 MG tablet Take 500-1,000 mg by mouth in the morning and at bedtime.     aspirin 81 MG chewable tablet Chew 1 tablet (81 mg total) by mouth daily.     atorvastatin (  LIPITOR) 80 MG tablet TAKE 1 TABLET BY MOUTH DAILY AT 6 PM. 90 tablet 3   Coenzyme Q10 (CO Q 10) 100 MG CAPS Take 100-200 mg by mouth daily in the afternoon.     diclofenac Sodium (VOLTAREN) 1 % GEL Apply 2 g topically daily as needed (Knee pain).      ezetimibe (ZETIA) 10 MG tablet TAKE 1 TABLET BY MOUTH EVERY DAY 90 tablet 3   irbesartan (AVAPRO) 300 MG tablet Take 1 tablet (300 mg total) by mouth daily. Patient need a appointment for future refills. 90 tablet 1   metoprolol tartrate (LOPRESSOR) 25 MG tablet TAKE 1 TABLET BY MOUTH TWICE A DAY 180 tablet 3   nitroGLYCERIN (NITROSTAT) 0.4 MG SL tablet Place 1 tablet (0.4 mg total) under the tongue every 5 (five) minutes x 3 doses as  needed for chest pain. 25 tablet 2   Polyethyl Glycol-Propyl Glycol (SYSTANE FREE OP) Place 2 drops into both eyes daily as needed (Floaters).      traMADol (ULTRAM) 50 MG tablet Take 50 mg by mouth at bedtime as needed.     trolamine salicylate (ASPERCREME) 10 % cream Apply 1 application topically daily as needed (Knee pain).     No facility-administered medications prior to visit.    Review of Systems  Constitutional:  Negative for chills, diaphoresis, fever, malaise/fatigue and weight loss.  HENT:  Negative for congestion.   Respiratory:  Negative for cough, hemoptysis, sputum production, shortness of breath and wheezing.   Cardiovascular:  Positive for chest pain. Negative for palpitations and leg swelling.     Objective:   Vitals:   11/17/21 1515  BP: 120/80  Pulse: 62  Temp: 98.2 F (36.8 C)  TempSrc: Oral  SpO2: 97%  Weight: 193 lb 9.6 oz (87.8 kg)  Height: 5\' 2"  (1.575 m)   SpO2: 97 %  Physical Exam: General: Well-appearing, no acute distress HENT: [place], AT Eyes: EOMI, no scleral icterus Respiratory: Clear to auscultation bilaterally.  No crackles, wheezing or rales Cardiovascular: RRR, -M/R/G, no JVD Extremities:-Edema,-tenderness Neuro: AAO x4, CNII-XII grossly intact Psych: Normal mood, normal affect  Data Reviewed:  Imaging: CXR 10/26/21 - LUL nodular opacity CXR 11/17/21 - Persistent LUL nodular opacity measuring 2.7 cm  PFT: None on file  Labs: CBC    Component Value Date/Time   WBC 8.3 10/26/2021 1730   RBC 4.65 10/26/2021 1730   HGB 14.2 10/26/2021 1730   HCT 42.6 10/26/2021 1730   PLT 261 10/26/2021 1730   MCV 91.6 10/26/2021 1730   MCH 30.5 10/26/2021 1730   MCHC 33.3 10/26/2021 1730   RDW 13.4 10/26/2021 1730   LYMPHSABS 2.5 10/26/2021 1730   MONOABS 0.5 10/26/2021 1730   EOSABS 0.1 10/26/2021 1730   BASOSABS 0.1 10/26/2021 1730      Assessment & Plan:   Discussion: 71 year old female active smoker with CAD s/p stent 2018, HTN,  HLD who presents for incidental left lung mass. Based on available data, prediction for malignancy is 56% on [place] and 73.2% on [employer]. Intermediate to high risk for malignancy suggests that further work-up is warranted. Recommend CT chest to further characterize mass with likely plan for tissue sampling via bronchoscopy. Discussed risks and benefits of bronchoscopy including bleeding, infection and pneumothorax. Addressed questions and concerns. She has history of cardiac stent but only currently on ASA. After discussion, patient has elected to continue work-up with CT Chest with follow-up appointment  Left lung mass --ORDER CT Chest with  contrast before 11/27/21   Health Maintenance Immunization History  Administered Date(s) Administered   Fluad Quad(high Dose 65+) 01/11/2019   Influenza, High Dose Seasonal PF 02/22/2017, 02/22/2018   PFIZER(Purple Top)SARS-COV-2 Vaccination 06/23/2019, 07/16/2019   Pneumococcal Polysaccharide-23 02/22/2017   CT Lung Screen - CT scan as above  Orders Placed This Encounter  Procedures   DG Chest 2 View    Standing Status:   Future    Number of Occurrences:   1    Standing Expiration Date:   11/18/2022    Order Specific Question:   Reason for Exam (SYMPTOM  OR DIAGNOSIS REQUIRED)    Answer:   pulm nodule    Order Specific Question:   Preferred imaging location?    Answer:   Internal  No orders of the defined types were placed in this encounter.   Return in about 10 days (around 11/27/2021).  I have spent a total time of 45-minutes on the day of the appointment reviewing prior documentation, coordinating care and discussing medical diagnosis and plan with the patient/family. Imaging, labs and tests included in this note have been reviewed and interpreted independently by me.  [place], MD [facility] 11/17/2021 3:33 PM  Office Number [phone]

## 2021-11-17 NOTE — Patient Instructions (Addendum)
Left lung mass --ORDER CT Chest with contrast before 11/27/21  Schedule follow-up with me on 11/27/21 at 12:00 PM

## 2021-11-24 ENCOUNTER — Other Ambulatory Visit: Payer: Medicare Other

## 2021-11-24 ENCOUNTER — Ambulatory Visit
Admission: RE | Admit: 2021-11-24 | Discharge: 2021-11-24 | Disposition: A | Discharge status: Home / Self Care | Payer: Medicare Other | Source: Ambulatory Visit | Attending: Pulmonary Disease | Admitting: Pulmonary Disease

## 2021-11-24 DIAGNOSIS — R918 Other nonspecific abnormal finding of lung field: Secondary | ICD-10-CM

## 2021-11-24 MED ORDER — IOPAMIDOL (ISOVUE-300) INJECTION 61%
75.0000 mL | Freq: Once | INTRAVENOUS | Status: AC | PRN
  Administered 2021-11-24: 75 mL via INTRAVENOUS

## 2021-11-27 ENCOUNTER — Ambulatory Visit (INDEPENDENT_AMBULATORY_CARE_PROVIDER_SITE_OTHER): Payer: Medicare Other | Admitting: Pulmonary Disease

## 2021-11-27 ENCOUNTER — Encounter: Payer: Self-pay | Admitting: Pulmonary Disease

## 2021-11-27 VITALS — BP 132/86 | HR 73 | Ht 63.0 in | Wt 192.4 lb

## 2021-11-27 DIAGNOSIS — R918 Other nonspecific abnormal finding of lung field: Secondary | ICD-10-CM

## 2021-11-27 NOTE — Progress Notes (Deleted)
Cardiology Office Note    Date:  11/27/2021   ID:  Kimberly Huang, DOB April 19, 1951, MRN [id number]  PCP:  Patient, No Pcp Per  Cardiologist:  Dr. [name]  No chief complaint on file.   History of Present Illness:  Kimberly Huang is a 71 y.o. female with past medical history of hypertension, hyperlipidemia, tobacco abuse and CAD.  Patient presented in October 2018 with chest pain and a posterior STEMI.  She ultimately underwent DES to her left circumflex, she also had residual RCA disease and that was intervened 48 hours later.  Ejection fraction was preserved on LV gram.    In September 2021 she underwent arthroscopic left knee surgery. She is still experiencing knee pain and may need TKR.   Recently evaluated by pulmonary for lung mass. CT showed A 3.3 x 2.1 cm left upper lobe pulmonary mass with associated left hilar and mediastinal lymphadenopathy. Finding concerning for malignancy. Scheduled for PET scan and PFTs.   She is doing well from a cardiac standpoint. No significant chest pain.  Denies any dyspnea, palpitations or edema.  Rare "twinges". Activity limited by arthritis.   Past Medical History:  Diagnosis Date   Arthritis    knee, hands   CAD S/P PCI OM1 99%-0%; Plan Staged PCI mRCA 90%. 02/19/2017   1. Severe 2 vessel obstructive CAD    - 99% thrombotic occlusion of first OM. This is a bifurcating vessel.     - 90% mid RCA-segmental 2. Good overall LV dysfunction with lateral HK 3. Moderately elevated LVEDP 4. Successful stenting of the first OM with DES. Distal embolization into the distal lateral OM branch.   Plan: DAPT for one year. Will treat with IV Aggrastat for 18 hours. Start high dose statin, beta blocker. IV Ntg for BP control acutely. Plan for stage PCI of RCA on Monday 71/21 if no complication.   Essential hypertension 02/21/2017   Hyperlipidemia with target LDL less than 70 02/21/2017   Presence of drug-eluting stent in L Cx: OM1 (Xience SIERRA DES 3.5 x 15)  02/21/2017   STEMI involving left circumflex coronary artery ([place]) 02/19/2017   Tobacco abuse 02/19/2017    Past Surgical History:  Procedure Laterality Date   CHOLECYSTECTOMY     CORONARY STENT INTERVENTION N/A 02/19/2017   Procedure: CORONARY STENT INTERVENTION;  Surgeon: [name], Milta Croson M, MD;  Location: [facility];  Service: Cardiovascular;  Laterality: N/A;   CORONARY STENT INTERVENTION N/A 02/21/2017   Procedure: CORONARY STENT INTERVENTION;  Surgeon: Belva Crome, MD;  Location: [facility];  Service: Cardiovascular;  Laterality: N/A;   KNEE ARTHROSCOPY Right 2010   KNEE ARTHROSCOPY WITH SUBCHONDROPLASTY Left 01/09/2020   Procedure: Left knee arthroscopy,partial medial meniscectomy, debridement, subchondroplasty left proximal tibia;  Surgeon: Susa Day, MD;  Location: [name];  Service: Orthopedics;  Laterality: Left;   LEFT HEART CATH AND CORONARY ANGIOGRAPHY N/A 02/19/2017   Procedure: LEFT HEART CATH AND CORONARY ANGIOGRAPHY;  Surgeon: [name], Sadik Piascik M, MD;  Location: [facility];  Service: Cardiovascular;  Laterality: N/A;   tonsillectomy     TUBAL LIGATION      Current Medications: Outpatient Medications Prior to Visit  Medication Sig Dispense Refill   acetaminophen (TYLENOL) 500 MG tablet Take 500-1,000 mg by mouth in the morning and at bedtime.     aspirin 81 MG chewable tablet Chew 1 tablet (81 mg total) by mouth daily.     atorvastatin (LIPITOR) 80 MG tablet TAKE  1 TABLET BY MOUTH DAILY AT 6 PM. 90 tablet 3   Coenzyme Q10 (CO Q 10) 100 MG CAPS Take 100-200 mg by mouth daily in the afternoon.     diclofenac Sodium (VOLTAREN) 1 % GEL Apply 2 g topically daily as needed (Knee pain).      ezetimibe (ZETIA) 10 MG tablet TAKE 1 TABLET BY MOUTH EVERY DAY 90 tablet 3   irbesartan (AVAPRO) 300 MG tablet Take 1 tablet (300 mg total) by mouth daily. Patient need a appointment for future refills. 90 tablet 1   metoprolol tartrate (LOPRESSOR) 25 MG tablet TAKE  1 TABLET BY MOUTH TWICE A DAY 180 tablet 3   nitroGLYCERIN (NITROSTAT) 0.4 MG SL tablet Place 1 tablet (0.4 mg total) under the tongue every 5 (five) minutes x 3 doses as needed for chest pain. 25 tablet 2   Polyethyl Glycol-Propyl Glycol (SYSTANE FREE OP) Place 2 drops into both eyes daily as needed (Floaters).      traMADol (ULTRAM) 50 MG tablet Take 50 mg by mouth at bedtime as needed.     trolamine salicylate (ASPERCREME) 10 % cream Apply 1 application topically daily as needed (Knee pain).     No facility-administered medications prior to visit.     Allergies:   Codeine   Social History   Socioeconomic History   Marital status: Married    Spouse name: Not on file   Number of children: 3   Years of education: Not on file   Highest education level: Not on file  Occupational History   Not on file  Tobacco Use   Smoking status: Some Days    Packs/day: 1.50    Years: 50.00    Total pack years: 75.00    Types: Cigarettes   Smokeless tobacco: Never   Tobacco comments:    Trying to quit, currently smokes 0.5 packs a day 11/17/21  Vaping Use   Vaping Use: Never used  Substance and Sexual Activity   Alcohol use: Yes    Comment: rare   Drug use: Never   Sexual activity: Not on file  Other Topics Concern   Not on file  Social History Narrative   Family runs the [employer].   Social Determinants of Health   Financial Resource Strain: Not on file  Food Insecurity: Not on file  Transportation Needs: Not on file  Physical Activity: Not on file  Stress: Not on file  Social Connections: Not on file     Family History:  The patient's family history includes Diabetes in her mother; Heart attack in her sister.   ROS:   Please see the history of present illness.    ROS All other systems reviewed and are negative.   PHYSICAL EXAM:   VS:  There were no vitals taken for this visit.   GENERAL:  Well appearing, overweight,,  WF in NAD HEENT:  PERRL, EOMI, sclera are  clear. Oropharynx is clear. NECK:  No jugular venous distention, carotid upstroke brisk and symmetric, no bruits, no thyromegaly or adenopathy LUNGS:  Clear to auscultation bilaterally CHEST:  Unremarkable HEART:  RRR,  PMI not displaced or sustained,S1 and S2 within normal limits, no S3, no S4: no clicks, no rubs, no murmurs ABD:  Soft, nontender. BS +, no masses or bruits. No hepatomegaly, no splenomegaly EXT:  2 + pulses throughout, no edema, no cyanosis no clubbing SKIN:  Warm and dry.  No rashes NEURO:  Alert and oriented x 3. Cranial nerves II  through XII intact. PSYCH:  Cognitively intact    Wt Readings from Last 3 Encounters:  11/27/21 192 lb 6.4 oz (87.3 kg)  11/17/21 193 lb 9.6 oz (87.8 kg)  10/26/21 190 lb (86.2 kg)      Studies/Labs Reviewed:   EKG:  EKG is ordered today. NSR with rate 59. 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 No acute change. I have personally reviewed and interpreted this study.    Recent Labs: 10/26/2021: ALT 18; BUN 11; Creatinine, Ser 1.00; Hemoglobin 14.2; Platelets 261; Potassium 4.1; Sodium 138   Lipid Panel    Component Value Date/Time   CHOL 141 12/08/2020 1620   TRIG 133 12/08/2020 1620   HDL 57 12/08/2020 1620   CHOLHDL 2.5 12/08/2020 1620   CHOLHDL 4.0 02/20/2017 0524   VLDL 16 02/20/2017 0524   LDLCALC 61 12/08/2020 1620    Additional studies/ records that were reviewed today include:   Cath 02/19/2017 Conclusion     Prox LAD to Mid LAD lesion, 20 %stenosed. Prox RCA to Mid RCA lesion, 90 %stenosed. Prox Cx to Mid Cx lesion, 30 %stenosed. The left ventricular systolic function is normal. LV end diastolic pressure is moderately elevated. The left ventricular ejection fraction is 50-55% by visual estimate. 1st Mrg lesion, 99 %stenosed. A STENT SIERRA 3.50 X 15 MM drug eluting stent was successfully placed. Post intervention, there is a 0% residual stenosis. Lat 1st Mrg lesion, 100 %stenosed.   1. Severe 2 vessel obstructive CAD     - 99% thrombotic occlusion of first OM. This is a bifurcating vessel.     - 90% mid RCA-segmental 2. Good overall LV dysfunction with lateral HK 3. Moderately elevated LVEDP 4. Successful stenting of the first OM with DES. Distal embolization into the distal lateral OM branch.   Plan: DAPT for one year. Will treat with IV Aggrastat for 18 hours. Start high dose statin, beta blocker. IV Ntg for BP control acutely. Plan for stage PCI of RCA on Monday if no complication.      Cath 02/21/2017 Conclusion   Successful stent implantation in the proximal to mid RCA reducing a segmental 90% stenosis to 0% with TIMI grade 3 flow. Stent used was a 3.0 x 26 Onyx post dilated to 14 atm 2.   RECOMMENDATIONS:   Continue dual antiplatelet therapy (aspirin and Brilinta) Discharge per discretion of IC team.       ASSESSMENT:    No diagnosis found.    PLAN:  In order of problems listed above:  CAD: s/p STEMI in October 2018 with DES the LCx and RCA. She is asymptomatic. Continue ASA, lipitor, beta blocker.   Hypertension: Blood pressure is  controlled. Continue current meds  Hyperlipidemia: on high dose lipitor and Zetia. Will update labs today.   Tobacco abuse:  Counseled on complete cessation.   5.   Arthritis left knee. Per Ortho.     Medication Adjustments/Labs and Tests Ordered: Current medicines are reviewed at length with the patient today.  Concerns regarding medicines are outlined above.  Medication changes, Labs and Tests ordered today are listed in the Patient Instructions below. There are no Patient Instructions on file for this visit.    Signed, Karigan Cloninger [name], MD  11/27/2021 2:18 PM    [facility] [place], [place], [place]  [address] Phone: [phone]; Fax: [phone]

## 2021-11-27 NOTE — Progress Notes (Unsigned)
Subjective:   PATIENT ID: [name] GENDER: female DOB: 27-Jul-1950, MRN: [id number]   HPI  Chief Complaint  Patient presents with   Follow-up    Reason for Visit: Follow-up  Ms. Kimberly Huang is a 71 year old female active smoker with CAD s/p stent, HTN, HLD who presents for follow-up.  Initial consult She reports mechanical fall several weeks ago and had persistent left chest pain. She presented in ED on 10/26/21. CXR demonstrated incidental left upper lobe lung nodule measuring 2.7 cm. CT Chest was recommended however she was unable to stay due to needing to pick up her husband. She scheduled Pulmonary follow-up to further discuss chest XR.  Denies shortness of breath, cough or wheezing. Denies fatigue, unintentional weight loss, night sweats or unexplained fevers/chills. At baseline not active due to knee pain. Normal mammogram and pap smears however last exam before pandemic. She is the caregiver for her husband so has not had time to care for herself in recent years.  11/27/21 Since our last visit she completed CT scan on 11/24/21. Denies shortness of breath, cough, wheezing, fatigue, weight loss, night sweats.  Social History: Active smoker. Currently smoking 1.5 ppd. Reports 50 years. She is the caregiver for her husband so has not had time to care for herself in recent years.    Past Medical History:  Diagnosis Date   Arthritis    knee, hands   CAD S/P PCI OM1 99%-0%; Plan Staged PCI mRCA 90%. 02/19/2017   1. Severe 2 vessel obstructive CAD    - 99% thrombotic occlusion of first OM. This is a bifurcating vessel.     - 90% mid RCA-segmental 2. Good overall LV dysfunction with lateral HK 3. Moderately elevated LVEDP 4. Successful stenting of the first OM with DES. Distal embolization into the distal lateral OM branch.   Plan: DAPT for one year. Will treat with IV Aggrastat for 18 hours. Start high dose statin, beta blocker. IV Ntg for BP control acutely. Plan for  stage PCI of RCA on Monday 46/50 if no complication.   Essential hypertension 02/21/2017   Hyperlipidemia with target LDL less than 70 02/21/2017   Presence of drug-eluting stent in L Cx: OM1 (Xience SIERRA DES 3.5 x 15) 02/21/2017   STEMI involving left circumflex coronary artery ([place]) 02/19/2017   Tobacco abuse 02/19/2017     Family History  Problem Relation Age of Onset   Diabetes Mother    Heart attack Sister      Social History   Occupational History   Not on file  Tobacco Use   Smoking status: Some Days    Packs/day: 1.50    Years: 50.00    Total pack years: 75.00    Types: Cigarettes   Smokeless tobacco: Never   Tobacco comments:    Trying to quit, currently smokes 0.5 packs a day 11/17/21  Vaping Use   Vaping Use: Never used  Substance and Sexual Activity   Alcohol use: Yes    Comment: rare   Drug use: Never   Sexual activity: Not on file    Allergies  Allergen Reactions   Codeine Nausea Only     Outpatient Medications Prior to Visit  Medication Sig Dispense Refill   acetaminophen (TYLENOL) 500 MG tablet Take 500-1,000 mg by mouth in the morning and at bedtime.     aspirin 81 MG chewable tablet Chew 1 tablet (81 mg total) by mouth daily.     atorvastatin (LIPITOR) 80  MG tablet TAKE 1 TABLET BY MOUTH DAILY AT 6 PM. 90 tablet 3   Coenzyme Q10 (CO Q 10) 100 MG CAPS Take 100-200 mg by mouth daily in the afternoon.     diclofenac Sodium (VOLTAREN) 1 % GEL Apply 2 g topically daily as needed (Knee pain).      ezetimibe (ZETIA) 10 MG tablet TAKE 1 TABLET BY MOUTH EVERY DAY 90 tablet 3   irbesartan (AVAPRO) 300 MG tablet Take 1 tablet (300 mg total) by mouth daily. Patient need a appointment for future refills. 90 tablet 1   metoprolol tartrate (LOPRESSOR) 25 MG tablet TAKE 1 TABLET BY MOUTH TWICE A DAY 180 tablet 3   nitroGLYCERIN (NITROSTAT) 0.4 MG SL tablet Place 1 tablet (0.4 mg total) under the tongue every 5 (five) minutes x 3 doses as needed for chest pain. 25  tablet 2   Polyethyl Glycol-Propyl Glycol (SYSTANE FREE OP) Place 2 drops into both eyes daily as needed (Floaters).      traMADol (ULTRAM) 50 MG tablet Take 50 mg by mouth at bedtime as needed.     trolamine salicylate (ASPERCREME) 10 % cream Apply 1 application topically daily as needed (Knee pain).     No facility-administered medications prior to visit.    Review of Systems  Constitutional:  Negative for chills, diaphoresis, fever, malaise/fatigue and weight loss.  HENT:  Negative for congestion.   Respiratory:  Negative for cough, hemoptysis, sputum production, shortness of breath and wheezing.   Cardiovascular:  Positive for chest pain. Negative for palpitations and leg swelling.     Objective:   Vitals:   11/27/21 1204  BP: 132/86  Pulse: 73  SpO2: 95%  Weight: 192 lb 6.4 oz (87.3 kg)  Height: 5\' 3"  (1.6 m)   SpO2: 95 % O2 Device: None (Room air)  Physical Exam: General: Well-appearing, no acute distress HENT: Mobile, AT Eyes: EOMI, no scleral icterus Respiratory: Clear to auscultation bilaterally.  No crackles, wheezing or rales Cardiovascular: RRR, -M/R/G, no JVD Extremities:-Edema,-tenderness Neuro: AAO x4, CNII-XII grossly intact Psych: Normal mood, normal affect  Data Reviewed:  Imaging: CXR 10/26/21 - LUL nodular opacity CXR 11/17/21 - Persistent LUL nodular opacity measuring 2.7 cm CT Chest 11/24/21  PFT: None on file  Labs: CBC    Component Value Date/Time   WBC 8.3 10/26/2021 1730   RBC 4.65 10/26/2021 1730   HGB 14.2 10/26/2021 1730   HCT 42.6 10/26/2021 1730   PLT 261 10/26/2021 1730   MCV 91.6 10/26/2021 1730   MCH 30.5 10/26/2021 1730   MCHC 33.3 10/26/2021 1730   RDW 13.4 10/26/2021 1730   LYMPHSABS 2.5 10/26/2021 1730   MONOABS 0.5 10/26/2021 1730   EOSABS 0.1 10/26/2021 1730   BASOSABS 0.1 10/26/2021 1730      Assessment & Plan:   Discussion: 71 year old female active smoker with CAD s/p stent 2018, HTN, HLD who presents for  incidental left lung mass. Based on available data, prediction for malignancy is 56% on [place] and 73.2% on [employer]. Intermediate to high risk for malignancy suggests that further work-up is warranted. Recommend CT chest to further characterize mass with likely plan for tissue sampling via bronchoscopy. Discussed risks and benefits of bronchoscopy including bleeding, infection and pneumothorax. Addressed questions and concerns. She has history of cardiac stent but only currently on ASA. After discussion, patient has elected to continue work-up with CT Chest with follow-up appointment  Left upper lobe lung mass with left hilar and mediastinal  adenopathy --ORDER PET scan. Will order Super D if unable to convert recent imaging --ORDER pulmonary function tests --Send message to Interventional Pulmonologist to schedule procedure +/- CT Surgery if indicated  Tobacco abuse Patient is an active smoker. Motivated  We discussed smoking cessation for 4 minutes. We discussed triggers and stressors and ways to deal with them. We discussed barriers to continued smoking and benefits of smoking cessation. Provided patient with information cessation techniques and interventions including [place] quitline.  Schedule pulmonary function tests AS SOON AS POSSIBLE  Will follow-up with me in 2 weeks with Dr. Verlee Monte (New patient 30 min)   Health Maintenance Immunization History  Administered Date(s) Administered   Fluad Quad(high Dose 65+) 01/11/2019   Influenza, High Dose Seasonal PF 02/22/2017, 02/22/2018   PFIZER(Purple Top)SARS-COV-2 Vaccination 06/23/2019, 07/16/2019   Pneumococcal Polysaccharide-23 02/22/2017   CT Lung Screen - CT scan as above  No orders of the defined types were placed in this encounter. No orders of the defined types were placed in this encounter.   No follow-ups on file.  I have spent a total time of 45-minutes on the day of the appointment reviewing prior documentation,  coordinating care and discussing medical diagnosis and plan with the patient/family. Imaging, labs and tests included in this note have been reviewed and interpreted independently by me.  Ramelo Oetken Rodman Pickle, MD [facility] 11/27/2021 12:32 PM  Office Number [phone]

## 2021-11-27 NOTE — Patient Instructions (Addendum)
Left upper lobe lung mass with left hilar and mediastinal adenopathy --ORDER PET scan. Will order Super D if unable to convert recent imaging --ORDER pulmonary function tests --Send message to Interventional Pulmonologist to schedule procedure +/- CT Surgery if indicated  Tobacco abuse Patient is an active smoker. Motivated  We discussed smoking cessation for 4 minutes. We discussed triggers and stressors and ways to deal with them. We discussed barriers to continued smoking and benefits of smoking cessation. Provided patient with information cessation techniques and interventions including [place] quitline.  Schedule pulmonary function tests AS SOON AS POSSIBLE  Will follow-up with me in 2 weeks with Dr. Verlee Monte (New patient 30 min)

## 2021-11-29 ENCOUNTER — Telehealth: Payer: Self-pay | Admitting: Student

## 2021-11-29 NOTE — Telephone Encounter (Signed)
I know she has PET/Super D CT scheduled 8/7. Would it be possible for her to get PET/Super D CT Chest before Friday this week or even just Super D CT Chest? PET could be done after bronchoscopy. I'd love to be able to schedule her for robotic case with me this Friday if at all possible. I tried calling to discuss this with her but I was unable to get in touch (left voicemail).   Thanks!   Nate

## 2021-11-30 ENCOUNTER — Encounter: Payer: Self-pay | Admitting: Pulmonary Disease

## 2021-11-30 ENCOUNTER — Telehealth: Payer: Self-pay | Admitting: Student

## 2021-11-30 DIAGNOSIS — R918 Other nonspecific abnormal finding of lung field: Secondary | ICD-10-CM | POA: Insufficient documentation

## 2021-11-30 DIAGNOSIS — R911 Solitary pulmonary nodule: Secondary | ICD-10-CM | POA: Insufficient documentation

## 2021-11-30 NOTE — Telephone Encounter (Signed)
Nuc med has rescheduled the combo case for Wednesday at 1:00 with a 12:30 arrival.  Dr Verlee Monte I don't want to call the pt to let her know about PET/CT being rescheduled until you have talked to her about bronch.  Please let me know when you have spoken to her.

## 2021-11-30 NOTE — Telephone Encounter (Signed)
It looks like Dr Loanne Drilling had discussed possible procedure with this pt at recent ov. I tried calling her to let her know that we are arranging this to be done and make sure she wants to proceed. There was no answer on her mobile or home phone. Will try again later.   Per Dr Verlee Monte- okay to cancel PET CT and proceed with PET scan.    I was able to reach pt's son, OK per DPR Riniyah Speich (son) [phone]. He states pt is hard to get a hold of on the phone, but he has plans to see her later in the day today and will let her know to call us first thing tomorrow.

## 2021-11-30 NOTE — Telephone Encounter (Signed)
Pt called regarding PET scan on 08/02 and  Bronch scheduled on 08/04, Pt states she was not aware of appts but is going to keep them, she asked if she needed to know anything or do anything special regarding these to please let her know, please advise.

## 2021-11-30 NOTE — Telephone Encounter (Signed)
Please schedule the following:  Provider performing procedure:Nathan Gorgeous [place]un  Diagnosis: Left upper lobe lung mass Which side if for nodule / mass? left Procedure: robotic bronchoscopy, EBUS  Has patient been spoken to by Provider and given informed consent? No Anesthesia: General Do you need Fluoro? Yes Duration of procedure: 2 hours Date: 8/4 Alternate Date: 8/3, 8/2 Time: AM preferred Location: [facility] Does patient have OSA? no DM? no Or Latex allergy? no Medication Restriction: none Anticoagulate/Antiplatelet: ok to continue aspirin Pre-op Labs Ordered:determined by Anesthesia Imaging request: None  (If, SuperDimension CT Chest, please have STAT courier sent to ENDO)  Please coordinate Pre-op COVID Testing

## 2021-11-30 NOTE — Telephone Encounter (Signed)
Pt has been scheduled for 8/4 at 7:30.  Case # [id number].  Pt will need to get covid test tomorrow.  I haven't spoken to her to give appt info yet due to NM still trying to get in touch with her.

## 2021-11-30 NOTE — Telephone Encounter (Signed)
I have sent secure chat message to Nuc Med to see if combo case can be moved up.

## 2021-11-30 NOTE — Telephone Encounter (Signed)
Attempted to call pt again at both listed numbers but she did not answer. Called and discussed case with son Amena Dockham including risks of procedure (<5% chance respiratory failure), major bleeding 05/998, pneumothorax 1-6% (our institution ~1%). Knows she needs to be NPO after midnight on Thursday. They are still planning to keep appointments for PET scan and bronch. He will see her tonight, discuss and call if they have any questions about the procedure.

## 2021-11-30 NOTE — Telephone Encounter (Signed)
Also, since Endo can pull images from CT last week need to verify if CT scheduled for 8/2 that was with PET combo case can be cancelled.

## 2021-12-01 ENCOUNTER — Telehealth: Payer: Self-pay | Admitting: Student

## 2021-12-01 NOTE — Telephone Encounter (Signed)
Pt will need to arrive at [facility] at 5:30 on Friday for the bronch.  She will need to go for covid test prior to Friday.  Ideally needs to go today or tomorrow.  Tried to call pt - left vm on mobile # and no vm on home #.  Tried to call son and vm is not set up.

## 2021-12-01 NOTE — Telephone Encounter (Signed)
Discussed upcoming robotic bronch/EBUS with Ms. Meikle yesterday evening. We will schedule on 8/7 so that Dr. [name] and myself can both participate in this case. Reviewed instructions to remain NPO after midnight on Sunday. OK to continue aspirin. She'll keep appointment tomorrow for PET scan.   [place]

## 2021-12-01 NOTE — Telephone Encounter (Signed)
Spoke to pt & went over appt info.  Her bronch was rescheduled to Monday.  She will go for covid test on Friday and is aware to arrive at [facility] Monday 2 1/2 hrs prior to procedure scheduled for 11:00.  Nothing further needed.

## 2021-12-02 ENCOUNTER — Telehealth: Payer: Self-pay | Admitting: Student

## 2021-12-02 ENCOUNTER — Ambulatory Visit (HOSPITAL_COMMUNITY): Payer: Medicare Other

## 2021-12-02 ENCOUNTER — Encounter (HOSPITAL_COMMUNITY): Payer: Medicare Other

## 2021-12-02 ENCOUNTER — Ambulatory Visit: Payer: Medicare Other | Admitting: Cardiology

## 2021-12-02 ENCOUNTER — Encounter (HOSPITAL_COMMUNITY): Admission: RE | Admit: 2021-12-02 | Payer: Medicare Other | Source: Ambulatory Visit

## 2021-12-02 ENCOUNTER — Other Ambulatory Visit (HOSPITAL_COMMUNITY): Payer: Medicare Other

## 2021-12-02 NOTE — Telephone Encounter (Signed)
Easa called on behalf of pt. States his father (hur husband) passed away and that they needed to cancel the endo for 8/7. I have gone ahead and called the 8/9 OV for them. Easa states he will callback to reschedule things once the funeral is planned and things settle. Please cancel the end for 8/7

## 2021-12-02 NOTE — Telephone Encounter (Signed)
Do we need to cancel the PFT appointment for this patient?

## 2021-12-02 NOTE — Telephone Encounter (Signed)
PFT was after the OV for some reason. Easa and I did not discuss cancelling that appt. He only knew about the endo and the OV and that's what he was most concerned about as those were for next week.

## 2021-12-03 NOTE — Telephone Encounter (Signed)
Noted  

## 2021-12-04 ENCOUNTER — Telehealth: Payer: Self-pay | Admitting: Student

## 2021-12-04 NOTE — Telephone Encounter (Signed)
I have called to get procedure cancelled and will route to NM/RB to make aware.

## 2021-12-04 NOTE — Telephone Encounter (Signed)
Called patient and she was very upset over the phone. Patient states that she has to cancel the procedure that was scheduled for next week on 12/07/2021 due to the sudden passing of her husband. Patient states that she will notify us when she wants to reschedule the procedure.   FYI Dr Loanne Drilling.

## 2021-12-07 ENCOUNTER — Other Ambulatory Visit (HOSPITAL_COMMUNITY): Payer: Medicare Other

## 2021-12-07 ENCOUNTER — Ambulatory Visit (HOSPITAL_COMMUNITY): Admission: RE | Admit: 2021-12-07 | Payer: Medicare Other | Source: Home / Self Care | Admitting: Emergency Medicine

## 2021-12-07 ENCOUNTER — Encounter (HOSPITAL_COMMUNITY): Admission: RE | Payer: Self-pay | Source: Home / Self Care

## 2021-12-07 ENCOUNTER — Encounter (HOSPITAL_COMMUNITY): Payer: Medicare Other

## 2021-12-07 SURGERY — BRONCHOSCOPY, WITH BIOPSY USING ELECTROMAGNETIC NAVIGATION
Anesthesia: General

## 2021-12-07 NOTE — Telephone Encounter (Signed)
Sorry to hear it for her. Called and attempted to discuss with Kimberly Huang, left voicemail encouraging her to call to have [place] set up procedure if she still wishes to pursue it.

## 2021-12-09 ENCOUNTER — Ambulatory Visit: Payer: Medicare Other | Admitting: Student

## 2021-12-16 ENCOUNTER — Other Ambulatory Visit: Payer: Self-pay | Admitting: *Deleted

## 2021-12-16 DIAGNOSIS — R918 Other nonspecific abnormal finding of lung field: Secondary | ICD-10-CM

## 2021-12-25 ENCOUNTER — Telehealth: Payer: Self-pay | Admitting: Student

## 2021-12-25 DIAGNOSIS — R918 Other nonspecific abnormal finding of lung field: Secondary | ICD-10-CM

## 2021-12-31 NOTE — Telephone Encounter (Signed)
Do you guys mind reaching out to her to try to get her scheduled ASAP for PET/Super D CT Chest and navigation bronchoscopy on 9/15 (as long as she has PET/Super D CT Chest done before 9/15)?  I tried calling her today and left voicemail  Thanks!!

## 2022-01-05 NOTE — Telephone Encounter (Signed)
Called and spoke with patient. Patient was wanting to know if Pet or bronch had been scheduled.  Advised patient per notes bronch would be 01/15/22, but I saw no time. Advised patient someone will call with bronch date/time.  I advised patient someone will contact her with Pet appointment.  Patient stated she is handling her husband's affairs since his passing. Patient stated is anyone calls 9/6 to call after lunch and she will be unavailable 01/13/22.

## 2022-01-06 NOTE — Telephone Encounter (Signed)
Called and spoke with pt at the end of the day yesterday. I made her aware of her bronch and covid testing information and told her a letter will be sent as well. Just got off the phone with Roderic Ovens at [facility], shes got this pt scheduled for this Friday 9/8 at 9:30am for combo case.

## 2022-01-07 ENCOUNTER — Encounter: Payer: Self-pay | Admitting: Student

## 2022-01-08 ENCOUNTER — Encounter (HOSPITAL_COMMUNITY)
Admission: RE | Admit: 2022-01-08 | Discharge: 2022-01-08 | Disposition: A | Discharge status: Home / Self Care | Payer: Medicare Other | Source: Ambulatory Visit | Attending: Pulmonary Disease | Admitting: Pulmonary Disease

## 2022-01-08 DIAGNOSIS — R918 Other nonspecific abnormal finding of lung field: Secondary | ICD-10-CM | POA: Diagnosis present

## 2022-01-08 LAB — GLUCOSE, CAPILLARY: Glucose-Capillary: 118 mg/dL — ABNORMAL HIGH (ref 70–99)

## 2022-01-08 MED ORDER — FLUDEOXYGLUCOSE F - 18 (FDG) INJECTION
9.5000 | Freq: Once | INTRAVENOUS | Status: AC | PRN
  Administered 2022-01-08: 9.5 via INTRAVENOUS

## 2022-01-11 ENCOUNTER — Telehealth: Payer: Self-pay | Admitting: Student

## 2022-01-11 ENCOUNTER — Encounter: Payer: Self-pay | Admitting: Student

## 2022-01-13 ENCOUNTER — Other Ambulatory Visit: Payer: Medicare Other

## 2022-01-13 ENCOUNTER — Other Ambulatory Visit: Payer: Self-pay | Admitting: *Deleted

## 2022-01-13 DIAGNOSIS — Z01818 Encounter for other preprocedural examination: Secondary | ICD-10-CM

## 2022-01-14 ENCOUNTER — Encounter (HOSPITAL_COMMUNITY): Payer: Self-pay | Admitting: Student

## 2022-01-14 ENCOUNTER — Other Ambulatory Visit: Payer: Self-pay

## 2022-01-14 LAB — NOVEL CORONAVIRUS, NAA: SARS-CoV-2, NAA: NOT DETECTED

## 2022-01-14 NOTE — Progress Notes (Signed)
PCP - none Cardiologist - Dr Peter [name] Pulmonology - Dr Leslye Peer  CT Chest x-ray - 01/08/22 EKG - DOS Stress Test - n/a ECHO - n/a Cardiac Cath - 02/19/17  ICD Pacemaker/Loop - n/a  Sleep Study -  n/a CPAP - none  Aspirin Instructions: Follow your surgeon's instructions on when to stop aspirin prior to surgery,  If no instructions were given by your surgeon then you will need to call the office for those instructions.  Anesthesia review: Yes, reviewed patient's medical history with Dr Christella Hartigan.  Patient is ok for 01/15/22 surgery.  STOP now taking any Aspirin (unless otherwise instructed by your surgeon), Aleve, Naproxen, Ibuprofen, Motrin, Advil, Goody's, BC's, all herbal medications, fish oil, and all vitamins.   Coronavirus Screening - covid test was negative. Do you have any of the following symptoms:  Cough yes/no: No Fever (>100.55F)  yes/no: No Runny nose yes/no: No Sore throat yes/no: No Difficulty breathing/shortness of breath  yes/no: No  Have you traveled in the last 14 days and where? yes/no: No  Patient verbalized understanding of instructions that were given via phone.

## 2022-01-15 ENCOUNTER — Other Ambulatory Visit: Payer: Self-pay

## 2022-01-15 ENCOUNTER — Ambulatory Visit (HOSPITAL_BASED_OUTPATIENT_CLINIC_OR_DEPARTMENT_OTHER): Payer: Medicare Other | Admitting: Certified Registered Nurse Anesthetist

## 2022-01-15 ENCOUNTER — Ambulatory Visit (HOSPITAL_COMMUNITY): Payer: Medicare Other | Admitting: Certified Registered Nurse Anesthetist

## 2022-01-15 ENCOUNTER — Ambulatory Visit (HOSPITAL_COMMUNITY)
Admission: RE | Admit: 2022-01-15 | Discharge: 2022-01-15 | Disposition: A | Discharge status: Home / Self Care | Payer: Medicare Other | Attending: Student | Admitting: Student

## 2022-01-15 ENCOUNTER — Encounter (HOSPITAL_COMMUNITY)
Admission: RE | Disposition: A | Discharge status: Home / Self Care | Payer: Self-pay | Source: Home / Self Care | Attending: Student

## 2022-01-15 ENCOUNTER — Ambulatory Visit (HOSPITAL_COMMUNITY): Payer: Medicare Other

## 2022-01-15 ENCOUNTER — Encounter (HOSPITAL_COMMUNITY): Payer: Self-pay | Admitting: Student

## 2022-01-15 DIAGNOSIS — R911 Solitary pulmonary nodule: Secondary | ICD-10-CM

## 2022-01-15 DIAGNOSIS — I252 Old myocardial infarction: Secondary | ICD-10-CM | POA: Diagnosis not present

## 2022-01-15 DIAGNOSIS — I2510 Atherosclerotic heart disease of native coronary artery without angina pectoris: Secondary | ICD-10-CM

## 2022-01-15 DIAGNOSIS — F17210 Nicotine dependence, cigarettes, uncomplicated: Secondary | ICD-10-CM | POA: Insufficient documentation

## 2022-01-15 DIAGNOSIS — R590 Localized enlarged lymph nodes: Secondary | ICD-10-CM | POA: Diagnosis present

## 2022-01-15 DIAGNOSIS — M1990 Unspecified osteoarthritis, unspecified site: Secondary | ICD-10-CM | POA: Diagnosis not present

## 2022-01-15 DIAGNOSIS — I10 Essential (primary) hypertension: Secondary | ICD-10-CM | POA: Insufficient documentation

## 2022-01-15 DIAGNOSIS — C3412 Malignant neoplasm of upper lobe, left bronchus or lung: Secondary | ICD-10-CM | POA: Diagnosis not present

## 2022-01-15 DIAGNOSIS — Z955 Presence of coronary angioplasty implant and graft: Secondary | ICD-10-CM | POA: Diagnosis not present

## 2022-01-15 DIAGNOSIS — Z01818 Encounter for other preprocedural examination: Secondary | ICD-10-CM

## 2022-01-15 DIAGNOSIS — Z79899 Other long term (current) drug therapy: Secondary | ICD-10-CM | POA: Diagnosis not present

## 2022-01-15 HISTORY — PX: BRONCHIAL BRUSHINGS: SHX5108

## 2022-01-15 HISTORY — PX: BRONCHIAL BIOPSY: SHX5109

## 2022-01-15 HISTORY — PX: FIDUCIAL MARKER PLACEMENT: SHX6858

## 2022-01-15 HISTORY — PX: VIDEO BRONCHOSCOPY WITH ENDOBRONCHIAL ULTRASOUND: SHX6177

## 2022-01-15 HISTORY — PX: HEMOSTASIS CONTROL: SHX6838

## 2022-01-15 HISTORY — PX: BRONCHIAL NEEDLE ASPIRATION BIOPSY: SHX5106

## 2022-01-15 HISTORY — PX: VIDEO BRONCHOSCOPY WITH RADIAL ENDOBRONCHIAL ULTRASOUND: SHX6849

## 2022-01-15 HISTORY — PX: FINE NEEDLE ASPIRATION: SHX6590

## 2022-01-15 LAB — CBC
HCT: 45.3 % (ref 36.0–46.0)
Hemoglobin: 15.1 g/dL — ABNORMAL HIGH (ref 12.0–15.0)
MCH: 30.7 pg (ref 26.0–34.0)
MCHC: 33.3 g/dL (ref 30.0–36.0)
MCV: 92.1 fL (ref 80.0–100.0)
Platelets: 250 10*3/uL (ref 150–400)
RBC: 4.92 MIL/uL (ref 3.87–5.11)
RDW: 13.2 % (ref 11.5–15.5)
WBC: 9.2 10*3/uL (ref 4.0–10.5)
nRBC: 0 % (ref 0.0–0.2)

## 2022-01-15 LAB — BASIC METABOLIC PANEL
Anion gap: 9 (ref 5–15)
BUN: 8 mg/dL (ref 8–23)
CO2: 25 mmol/L (ref 22–32)
Calcium: 9.1 mg/dL (ref 8.9–10.3)
Chloride: 105 mmol/L (ref 98–111)
Creatinine, Ser: 0.86 mg/dL (ref 0.44–1.00)
GFR, Estimated: >60 mL/min (ref >60)
Glucose, Bld: 126 mg/dL — ABNORMAL HIGH (ref 70–99)
Potassium: 4.3 mmol/L (ref 3.5–5.1)
Sodium: 139 mmol/L (ref 135–145)

## 2022-01-15 SURGERY — BRONCHOSCOPY, WITH BIOPSY USING ELECTROMAGNETIC NAVIGATION
Anesthesia: General

## 2022-01-15 MED ORDER — PHENYLEPHRINE HCL-NACL 20-0.9 MG/250ML-% IV SOLN
INTRAVENOUS | Status: DC | PRN
  Administered 2022-01-15: 25 ug/min via INTRAVENOUS

## 2022-01-15 MED ORDER — PROPOFOL 500 MG/50ML IV EMUL
INTRAVENOUS | Status: DC | PRN
  Administered 2022-01-15: 125 ug/kg/min via INTRAVENOUS
  Administered 2022-01-15: 150 ug/kg/min via INTRAVENOUS

## 2022-01-15 MED ORDER — AMISULPRIDE (ANTIEMETIC) 5 MG/2ML IV SOLN: 10.0000 mg | Freq: Once | INTRAVENOUS | Status: DC | PRN

## 2022-01-15 MED ORDER — FENTANYL CITRATE (PF) 100 MCG/2ML IJ SOLN
INTRAMUSCULAR | Status: DC | PRN
  Administered 2022-01-15: 100 ug via INTRAVENOUS

## 2022-01-15 MED ORDER — PHENYLEPHRINE 80 MCG/ML (10ML) SYRINGE FOR IV PUSH (FOR BLOOD PRESSURE SUPPORT)
PREFILLED_SYRINGE | INTRAVENOUS | Status: DC | PRN
  Administered 2022-01-15 (×2): 160 ug via INTRAVENOUS
  Administered 2022-01-15 (×2): 80 ug via INTRAVENOUS

## 2022-01-15 MED ORDER — CHLORHEXIDINE GLUCONATE 0.12 % MT SOLN
15.0000 mL | Freq: Once | OROMUCOSAL | Status: AC
  Administered 2022-01-15: 15 mL via OROMUCOSAL
  Filled 2022-01-15 (×2): qty 15

## 2022-01-15 MED ORDER — ROCURONIUM BROMIDE 10 MG/ML (PF) SYRINGE
PREFILLED_SYRINGE | INTRAVENOUS | Status: DC | PRN
  Administered 2022-01-15: 50 mg via INTRAVENOUS
  Administered 2022-01-15: 20 mg via INTRAVENOUS

## 2022-01-15 MED ORDER — PROPOFOL 10 MG/ML IV BOLUS
INTRAVENOUS | Status: DC | PRN
  Administered 2022-01-15: 100 mg via INTRAVENOUS
  Administered 2022-01-15: 30 mg via INTRAVENOUS
  Administered 2022-01-15: 50 mg via INTRAVENOUS

## 2022-01-15 MED ORDER — ACETAMINOPHEN 500 MG PO TABS
1000.0000 mg | ORAL_TABLET | Freq: Once | ORAL | Status: AC
  Administered 2022-01-15: 1000 mg via ORAL
  Filled 2022-01-15: qty 2

## 2022-01-15 MED ORDER — ONDANSETRON HCL 4 MG/2ML IJ SOLN: 4.0000 mg | Freq: Once | INTRAMUSCULAR | Status: DC | PRN

## 2022-01-15 MED ORDER — DEXAMETHASONE SODIUM PHOSPHATE 10 MG/ML IJ SOLN
INTRAMUSCULAR | Status: DC | PRN
  Administered 2022-01-15: 10 mg via INTRAVENOUS

## 2022-01-15 MED ORDER — ONDANSETRON HCL 4 MG/2ML IJ SOLN
INTRAMUSCULAR | Status: DC | PRN
  Administered 2022-01-15: 4 mg via INTRAVENOUS

## 2022-01-15 MED ORDER — LIDOCAINE 2% (20 MG/ML) 5 ML SYRINGE
INTRAMUSCULAR | Status: DC | PRN
  Administered 2022-01-15: 60 mg via INTRAVENOUS

## 2022-01-15 MED ORDER — SUGAMMADEX SODIUM 200 MG/2ML IV SOLN
INTRAVENOUS | Status: DC | PRN
  Administered 2022-01-15: 200 mg via INTRAVENOUS

## 2022-01-15 MED ORDER — EPHEDRINE SULFATE-NACL 50-0.9 MG/10ML-% IV SOSY
PREFILLED_SYRINGE | INTRAVENOUS | Status: DC | PRN
  Administered 2022-01-15: 5 mg via INTRAVENOUS

## 2022-01-15 MED ORDER — LACTATED RINGERS IV SOLN: INTRAVENOUS | Status: DC

## 2022-01-15 MED ORDER — FENTANYL CITRATE (PF) 100 MCG/2ML IJ SOLN: 25.0000 ug | INTRAMUSCULAR | Status: DC | PRN

## 2022-01-15 SURGICAL SUPPLY — 1 items: Super Lock Fiducial Marker IMPLANT

## 2022-01-15 NOTE — Anesthesia Procedure Notes (Signed)
Procedure Name: Intubation Date/Time: 01/15/2022 7:38 AM  Performed by: Colin Benton, CRNAPre-anesthesia Checklist: Patient identified, Emergency Drugs available, Suction available and Patient being monitored Patient Re-evaluated:Patient Re-evaluated prior to induction Oxygen Delivery Method: Circle system utilized Preoxygenation: Pre-oxygenation with 100% oxygen Induction Type: IV induction Ventilation: Mask ventilation without difficulty Laryngoscope Size: Miller and 3 Grade View: Grade I Tube type: Oral Number of attempts: 1 Airway Equipment and Method: Stylet Placement Confirmation: ETT inserted through vocal cords under direct vision, positive ETCO2 and breath sounds checked- equal and bilateral Secured at: 22 cm Tube secured with: Tape Dental Injury: Teeth and Oropharynx as per pre-operative assessment

## 2022-01-15 NOTE — H&P (Signed)
Kimberly Huang is an 71 y.o. female.   Chief Complaint: presentation for lung nodule and mediastinal LAD  HPI: 6yF with history of CAD, HTN, smoking who presents for robotic bronchoscopy, EBUS for pulmonary nodule and mediastinal LAD. She has no worsening DOE, bothersome cough, CP, indigestion (anginal equivalent).   Otherwise review of systems is negative except as in HPI  Past Medical History:  Diagnosis Date   Arthritis    knee, hands   CAD S/P PCI OM1 99%-0%; Plan Staged PCI mRCA 90%. 02/19/2017   1. Severe 2 vessel obstructive CAD    - 99% thrombotic occlusion of first OM. This is a bifurcating vessel.     - 90% mid RCA-segmental 2. Good overall LV dysfunction with lateral HK 3. Moderately elevated LVEDP 4. Successful stenting of the first OM with DES. Distal embolization into the distal lateral OM branch.   Plan: DAPT for one year. Will treat with IV Aggrastat for 18 hours. Start high dose statin, beta blocker. IV Ntg for BP control acutely. Plan for stage PCI of RCA on Monday 54/00 if no complication.   Essential hypertension 02/21/2017   Hyperlipidemia with target LDL less than 70 02/21/2017   Presence of drug-eluting stent in L Cx: OM1 (Xience SIERRA DES 3.5 x 15) 02/21/2017   STEMI involving left circumflex coronary artery ([place]) 02/19/2017   Tobacco abuse 02/19/2017    Past Surgical History:  Procedure Laterality Date   CARDIAC CATHETERIZATION  02/21/2017   CHOLECYSTECTOMY     CORONARY STENT INTERVENTION N/A 02/19/2017   Procedure: CORONARY STENT INTERVENTION;  Surgeon: [name], Peter M, MD;  Location: [facility];  Service: Cardiovascular;  Laterality: N/A;   CORONARY STENT INTERVENTION N/A 02/21/2017   Procedure: CORONARY STENT INTERVENTION;  Surgeon: Belva Crome, MD;  Location: [facility];  Service: Cardiovascular;  Laterality: N/A;   KNEE ARTHROSCOPY Right 2010   KNEE ARTHROSCOPY WITH SUBCHONDROPLASTY Left 01/09/2020   Procedure: Left knee  arthroscopy,partial medial meniscectomy, debridement, subchondroplasty left proximal tibia;  Surgeon: Susa Day, MD;  Location: [name];  Service: Orthopedics;  Laterality: Left;   LEFT HEART CATH AND CORONARY ANGIOGRAPHY N/A 02/19/2017   Procedure: LEFT HEART CATH AND CORONARY ANGIOGRAPHY;  Surgeon: [name], Peter M, MD;  Location: [facility];  Service: Cardiovascular;  Laterality: N/A;   TONSILLECTOMY     TUBAL LIGATION      Family History  Problem Relation Age of Onset   Diabetes Mother    Heart attack Sister    Social History:  reports that she has been smoking cigarettes. She has a 75.00 pack-year smoking history. She has never used smokeless tobacco. She reports current alcohol use. She reports that she does not use drugs.  Allergies:  Allergies  Allergen Reactions   Codeine Nausea Only    Medications Prior to Admission  Medication Sig Dispense Refill   acetaminophen (TYLENOL) 500 MG tablet Take 500 mg by mouth 2 (two) times Huang as needed for moderate pain.     aspirin 81 MG chewable tablet Chew 1 tablet (81 mg total) by mouth Huang.     atorvastatin (LIPITOR) 80 MG tablet TAKE 1 TABLET BY MOUTH Huang AT 6 PM. 90 tablet 3   Coenzyme Q10 (CO Q 10) 100 MG CAPS Take 100-200 mg by mouth Huang in the afternoon.     diclofenac Sodium (VOLTAREN) 1 % GEL Apply 2 g topically Huang as needed (Knee pain).      ezetimibe (ZETIA)  10 MG tablet TAKE 1 TABLET BY MOUTH EVERY DAY (Patient taking differently: Takes at night) 90 tablet 3   irbesartan (AVAPRO) 300 MG tablet Take 1 tablet (300 mg total) by mouth Huang. Patient need a appointment for future refills. 90 tablet 1   metoprolol tartrate (LOPRESSOR) 25 MG tablet TAKE 1 TABLET BY MOUTH TWICE A DAY 180 tablet 3   nitroGLYCERIN (NITROSTAT) 0.4 MG SL tablet Place 1 tablet (0.4 mg total) under the tongue every 5 (five) minutes x 3 doses as needed for chest pain. 25 tablet 2   Polyethyl Glycol-Propyl Glycol (SYSTANE FREE OP) Place 2  drops into both eyes Huang as needed (Floaters).      traMADol (ULTRAM) 50 MG tablet Take 50 mg by mouth at bedtime.     trolamine salicylate (ASPERCREME) 10 % cream Apply 1 application topically Huang as needed (Knee pain).      Results for orders placed or performed during the hospital encounter of 01/15/22 (from the past 48 hour(s))  Basic metabolic panel per protocol     Status: Abnormal   Collection Time: 01/15/22  5:57 AM  Result Value Ref Range   Sodium 139 135 - 145 mmol/L   Potassium 4.3 3.5 - 5.1 mmol/L   Chloride 105 98 - 111 mmol/L   CO2 25 22 - 32 mmol/L   Glucose, Bld 126 (H) 70 - 99 mg/dL    Comment: Glucose reference range applies only to samples taken after fasting for at least 8 hours.   BUN 8 8 - 23 mg/dL   Creatinine, Ser 0.86 0.44 - 1.00 mg/dL   Calcium 9.1 8.9 - 10.3 mg/dL   GFR, Estimated >60 >60 mL/min    Comment: (NOTE) Calculated using the CKD-EPI Creatinine Equation (2021)    Anion gap 9 5 - 15    Comment: Performed at [facility] [address], [place], [place] [address]  CBC per protocol     Status: Abnormal   Collection Time: 01/15/22  5:57 AM  Result Value Ref Range   WBC 9.2 4.0 - 10.5 K/uL   RBC 4.92 3.87 - 5.11 MIL/uL   Hemoglobin 15.1 (H) 12.0 - 15.0 g/dL   HCT 45.3 36.0 - 46.0 %   MCV 92.1 80.0 - 100.0 fL   MCH 30.7 26.0 - 34.0 pg   MCHC 33.3 30.0 - 36.0 g/dL   RDW 13.2 11.5 - 15.5 %   Platelets 250 150 - 400 K/uL   nRBC 0.0 0.0 - 0.2 %    Comment: Performed at [facility], [place] [address] [place], [place] [address]   No results found.    Blood pressure (!) 144/75, pulse 67, temperature 98 F (36.7 C), temperature source Oral, resp. rate 17, height 5\' 3"  (1.6 m), weight 87.1 kg, SpO2 95 %.  PE: General appearance: 71 y.o., female, NAD, conversant  Eyes: anicteric sclerae; PERRL, tracking appropriately HENT: NCAT; MMM, edentulous Neck: Trachea midline; no lymphadenopathy, no JVD Lungs: CTAB, no crackles, no wheeze,  with normal respiratory effort CV: RRR, no murmur  Abdomen: Soft, non-tender; non-distended, BS present  Extremities: No peripheral edema, warm Skin: Normal turgor and texture; no rash Psych: Appropriate affect Neuro: Alert and oriented to person and place, no focal deficit    Assessment/Plan  # LUL pulmonary nodule # Mediastinal LAD  Plan for staging EBUS and robotic bronchoscopy under general anesthesia  [name], MD 01/15/2022, 7:25 AM

## 2022-01-15 NOTE — Discharge Instructions (Addendum)
Wait to resume aspirin until tomorrow as long as bloody cough is improving/resolving. Call our office [phone] if you're coughing up clot the size of your thumb or if you have any questions about your results. We will call as soon as results are available. I will arrange clinic visit as well with Dr. Loanne Drilling or myself to review results.

## 2022-01-15 NOTE — Anesthesia Preprocedure Evaluation (Addendum)
Anesthesia Evaluation  Patient identified by MRN, date of birth, ID band Patient awake    Reviewed: Allergy & Precautions, NPO status , Patient's Chart, lab work & pertinent test results  Airway Mallampati: III  TM Distance: >3 FB Neck ROM: Full    Dental  (+) Edentulous Lower, Edentulous Upper   Pulmonary Current Smoker and Patient abstained from smoking.,    Pulmonary exam normal        Cardiovascular hypertension, Pt. on home beta blockers + CAD, + Past MI and + Cardiac Stents  Normal cardiovascular exam     Neuro/Psych negative neurological ROS  negative psych ROS   GI/Hepatic negative GI ROS, Neg liver ROS,   Endo/Other  negative endocrine ROS  Renal/GU negative Renal ROS     Musculoskeletal  (+) Arthritis ,   Abdominal (+) + obese,   Peds  Hematology negative hematology ROS (+)   Anesthesia Other Findings mass of left lung  Reproductive/Obstetrics                            Anesthesia Physical Anesthesia Plan  ASA: 3  Anesthesia Plan: General   Post-op Pain Management:    Induction: Intravenous  PONV Risk Score and Plan: 2 and Ondansetron, Dexamethasone and Treatment may vary due to age or medical condition  Airway Management Planned: Oral ETT  Additional Equipment:   Intra-op Plan:   Post-operative Plan: Extubation in OR  Informed Consent: I have reviewed the patients History and Physical, chart, labs and discussed the procedure including the risks, benefits and alternatives for the proposed anesthesia with the patient or authorized representative who has indicated his/her understanding and acceptance.       Plan Discussed with: CRNA  Anesthesia Plan Comments:         Anesthesia Quick Evaluation

## 2022-01-15 NOTE — Op Note (Signed)
Video Bronchoscopy with Robotic Assisted Bronchoscopic Navigation   Date of Operation: 01/15/2022   Pre-op Diagnosis: LUL pulmonary nodule and mediastinal LAD  Post-op Diagnosis: LUL pulmonary nodule and mediastinal LAD  Surgeon: Walker Shadow  Anesthesia: General endotracheal anesthesia  Operation: Flexible video fiberoptic bronchoscopy with robotic assistance and biopsies.  Estimated Blood Loss: Minimal  Complications: None  Indications and History: Kimberly Huang is a 71 y.o. female with history of smoking, CAD, HTN and pulmonary nodule, PET avid mediastinal lymphadenopathy. The risks, benefits, complications, treatment options and expected outcomes were discussed with the patient.  The possibilities of pneumothorax, pneumonia, reaction to medication, pulmonary aspiration, perforation of a viscus, bleeding, failure to diagnose a condition and creating a complication requiring transfusion or operation were discussed with the patient who freely signed the consent.    Description of Procedure: The patient was seen in the Preoperative Area, was examined and was deemed appropriate to proceed.  [facility], identified as Kimberly Huang and the procedure verified as Flexible Video Fiberoptic Bronchoscopy.  A Time Out was held and the above information confirmed.   Prior to the date of the procedure a high-resolution CT scan of the chest was performed. Utilizing ION software program a virtual tracheobronchial tree was generated to allow the creation of distinct navigation pathways to the patient's parenchymal abnormalities. After being taken to the operating room general anesthesia was initiated and the patient  was orally intubated. The video fiberoptic bronchoscope was introduced via the endotracheal tube and a general inspection was performed which showed normal right and left lung anatomy, aspiration of the bilateral mainstems was completed to remove any  remaining secretions. Robotic catheter inserted into patient's endotracheal tube.   Target #1 LUL pulmonary nodule: The distinct navigation pathways prepared prior to this procedure were then utilized to navigate to patient's lesion identified on CT scan. CIOS imaging was used to aid navigation and confirm ideal location for biopsy and later to confirm location of needle in lesion as below. The robotic catheter was secured into place and the vision probe was withdrawn.  Lesion location was approximated using fluoroscopy and radial endobronchial ultrasound for peripheral targeting. Under fluoroscopic guidance transbronchial needle brushings, transbronchial needle biopsies, and transbronchial forceps biopsies were performed to be sent for cytology and pathology. A fiducial marker was placed at this site. A bronchioalveolar lavage was performed in the LUL and sent for cytology.     EBUS: Hilar and mediastinal lymph nodes surveyed with EBUS with sampling as detailed below.  Samples Target #1: 1. Transbronchial needle brushings from LUL pulmonary nodule 2. Transbronchial Wang needle biopsies from LUL pulmonary nodule 3. Transbronchial forceps biopsies from LUL pulmonary nodule 4. Bronchoalveolar lavage from lingula 5. Endobronchial biopsies from LUL pulmonary nodule  EBUS: 1. EBNA with 4 passes at 4L conglomerate of LN 2. EBNA with 5 passes at 11L  Plans:  The patient will be discharged from the PACU to home when recovered from anesthesia and after chest x-ray is reviewed. We will review the cytology, pathology and microbiology results with the patient when they become available. Outpatient followup will be with Dr. Loanne Drilling or myself.

## 2022-01-15 NOTE — Transfer of Care (Signed)
Immediate Anesthesia Transfer of Care Note  Patient: Kimberly Huang  Procedure(s) Performed: ROBOTIC ASSISTED NAVIGATIONAL BRONCHOSCOPY VIDEO BRONCHOSCOPY WITH ENDOBRONCHIAL ULTRASOUND BRONCHIAL NEEDLE ASPIRATION BIOPSIES RADIAL ENDOBRONCHIAL ULTRASOUND BRONCHIAL BIOPSIES BRONCHIAL BRUSHINGS FIDUCIAL MARKER PLACEMENT FINE NEEDLE ASPIRATION HEMOSTASIS CONTROL  Patient Location: PACU  Anesthesia Type:General  Level of Consciousness: drowsy  Airway & Oxygen Therapy: Patient Spontanous Breathing and Patient connected to face mask oxygen  Post-op Assessment: Report given to RN and Post -op Vital signs reviewed and stable  Post vital signs: Reviewed and stable  Last Vitals:  Vitals Value Taken Time  BP 99/58 01/15/22 0941  Temp    Pulse 76 01/15/22 0946  Resp 16 01/15/22 0946  SpO2 96 % 01/15/22 0946  Vitals shown include unvalidated device data.  Last Pain:  Vitals:   01/15/22 0608  TempSrc:   PainSc: 0-No pain         Complications: No notable events documented.

## 2022-01-17 ENCOUNTER — Encounter (HOSPITAL_COMMUNITY): Payer: Self-pay | Admitting: Student

## 2022-01-17 NOTE — Anesthesia Postprocedure Evaluation (Signed)
Anesthesia Post Note  Patient: Kimberly Huang  Procedure(s) Performed: ROBOTIC ASSISTED NAVIGATIONAL BRONCHOSCOPY VIDEO BRONCHOSCOPY WITH ENDOBRONCHIAL ULTRASOUND BRONCHIAL NEEDLE ASPIRATION BIOPSIES RADIAL ENDOBRONCHIAL ULTRASOUND BRONCHIAL BIOPSIES BRONCHIAL BRUSHINGS FIDUCIAL MARKER PLACEMENT FINE NEEDLE ASPIRATION HEMOSTASIS CONTROL     Patient location during evaluation: PACU Anesthesia Type: General Level of consciousness: awake Pain management: pain level controlled Vital Signs Assessment: post-procedure vital signs reviewed and stable Respiratory status: spontaneous breathing, nonlabored ventilation, respiratory function stable and patient connected to nasal cannula oxygen Cardiovascular status: blood pressure returned to baseline and stable Postop Assessment: no apparent nausea or vomiting Anesthetic complications: no   No notable events documented.  Last Vitals:  Vitals:   01/15/22 1000 01/15/22 1015  BP: (!) 109/58 108/66  Pulse: 66 60  Resp: 18 16  Temp:    SpO2: 92% 92%    Last Pain:  Vitals:   01/15/22 1015  TempSrc:   PainSc: 0-No pain                 Devora Tortorella P Zuleyka Kloc

## 2022-01-20 ENCOUNTER — Telehealth: Payer: Self-pay | Admitting: Student

## 2022-01-20 DIAGNOSIS — C3492 Malignant neoplasm of unspecified part of left bronchus or lung: Secondary | ICD-10-CM

## 2022-01-20 DIAGNOSIS — C3490 Malignant neoplasm of unspecified part of unspecified bronchus or lung: Secondary | ICD-10-CM

## 2022-01-20 LAB — CYTOLOGY - NON PAP

## 2022-01-20 NOTE — Telephone Encounter (Signed)
Attempted to call at all listed numbers to discuss results of bronchoscopy and left voicemail. Message sent to discuss case at tumor board. PET/CT already completed, referrals made to oncology, radiation oncology, and TCTS.

## 2022-01-20 NOTE — Progress Notes (Signed)
Radiation Oncology         [phone] ________________________________  Initial Outpatient Consultation  Name: Kimberly Huang MRN: [id number]  Date: 01/21/2022  DOB: [birth year]  LK:[id number], No Pcp Per  [name], MD   REFERRING PHYSICIAN: [name], MD  DIAGNOSIS: There were no encounter diagnoses.  Adenocarcinoma of the left upper lobe   HISTORY OF PRESENT ILLNESS::Kimberly Huang is a 71 y.o. female who is accompanied by ***. she is seen as a courtesy of Dr. Verlee Monte for an opinion concerning radiation therapy as part of management for her recently diagnosed left lung cancer.   The patient presented to the ED on 10/26/21 with the cc of sharp left sided rib pain 2 weeks after a fall. She detailed the pain as exacerbated by taking deep breaths and laying on her left side. CXR performed showed a new nodular focal opacity in the left upper lobe measuring 2.7 cm. No other abnormalities were seen in either lung including pleural effusion or pneumothorax. (CT not performed in the ED given that the patient had to leave to pick up her husband).   Accordingly, the patient was referred to [facility] pulmonology, Dr. Loanne Drilling, on 11/17/21 for further evaluation and management. During this visit, the patient denied any other symptoms (both respiratory and otherwise) asides from left rib pain, and reported smoking 1.5 ppd for 50 years.   Chest CT ordered by Dr. Loanne Drilling performed on 11/24/21 showed a 3.3 x 2.1 cm left upper lobe pulmonary mass with associated left hilar and mediastinal lymphadenopathy concerning for Malignancy. Ct also showed a noncalcified 0.4 cm LUL nodule, several calcified pulmonary micronodules within the LUL and RUL, bilateral indeterminate adrenal nodules measuring 2.8 x 1.7 on the left and 2.1 x 1.3 cm on the right (possibly representing adrenal lesions vs metastases), and partially visualized fluid density lesion and calcifications within the left kidney.   Prior  to undergoing bronchoscopy, Dr. Loanne Drilling recommended proceeding with PET and PFT's first.   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 Understandably, this delayed her work-up.   PET scan on 01/08/22 showed: the left upper lobe lung mass as FDG avid and abutting the oblique fissure and left lateral pleural surface. The mass was appreciated to measure 2.9 cm with an SUV max of 11.45 concerning for malignancy. PET also showed tracer avid left hilar and left mediastinal lymph nodes compatible with nodal metastasis, and incidental left seventh, eighth, and ninth rib fractures with corresponding posttraumatic uptake. Otherwise, PET showed no signs of tracer avid distant metastatic disease.    Super D chest CT also performed on 01/08/22 showed a mild increase in size of FDG avid left upper lobe lung mass abutting the left lateral pleural surface and oblique fissure (as detailed on PET above). CT also re-demonstrated the small nonspecific pulmonary nodules within both lungs measuring less than 5 mm (unchanged from 11/24/2021), and the bilateral adrenal gland adenomas.  The patient opted to proceed with bronchoscopy and biopsies on 01/15/22 under the care of Dr. Verlee Monte. FNA and lavage of the LUL revealed malignant cells consistent with adenocarcinoma. FNA of lymph nodes 11L showed rare atypical large cells suspicious for tumor. FNA of lymph node 4L showed no evidence of malignancy and acute inflammatory cells.    Patient has an MRI of the brain scheduled for 01/27/22 per Dr. Verlee Monte.    PREVIOUS RADIATION THERAPY: No  PAST MEDICAL HISTORY:  Past Medical  History:  Diagnosis Date   Arthritis    knee, hands   CAD S/P PCI OM1 99%-0%; Plan Staged PCI mRCA 90%. 02/19/2017   1. Severe 2 vessel obstructive CAD    - 99% thrombotic occlusion of first OM. This is a bifurcating vessel.     - 90% mid RCA-segmental 2. Good overall LV dysfunction with  lateral HK 3. Moderately elevated LVEDP 4. Successful stenting of the first OM with DES. Distal embolization into the distal lateral OM branch.   Plan: DAPT for one year. Will treat with IV Aggrastat for 18 hours. Start high dose statin, beta blocker. IV Ntg for BP control acutely. Plan for stage PCI of RCA on Monday 08/67 if no complication.   Essential hypertension 02/21/2017   Hyperlipidemia with target LDL less than 70 02/21/2017   Presence of drug-eluting stent in L Cx: OM1 (Xience SIERRA DES 3.5 x 15) 02/21/2017   STEMI involving left circumflex coronary artery ([place]) 02/19/2017   Tobacco abuse 02/19/2017    PAST SURGICAL HISTORY: Past Surgical History:  Procedure Laterality Date   BRONCHIAL BIOPSY  01/15/2022   Procedure: BRONCHIAL BIOPSIES;  Surgeon: [name], MD;  Location: [facility] ENDOSCOPY;  Service: Pulmonary;;   BRONCHIAL BRUSHINGS  01/15/2022   Procedure: BRONCHIAL BRUSHINGS;  Surgeon: [name], MD;  Location: [facility] ENDOSCOPY;  Service: Pulmonary;;   BRONCHIAL NEEDLE ASPIRATION BIOPSY  01/15/2022   Procedure: BRONCHIAL NEEDLE ASPIRATION BIOPSIES;  Surgeon: [name], MD;  Location: [facility] ENDOSCOPY;  Service: Pulmonary;;   CARDIAC CATHETERIZATION  02/21/2017   CHOLECYSTECTOMY     CORONARY STENT INTERVENTION N/A 02/19/2017   Procedure: CORONARY STENT INTERVENTION;  Surgeon: [name], Peter M, MD;  Location: [place] CV LAB;  Service: Cardiovascular;  Laterality: N/A;   CORONARY STENT INTERVENTION N/A 02/21/2017   Procedure: CORONARY STENT INTERVENTION;  Surgeon: Belva Crome, MD;  Location: [place] [facility];  Service: Cardiovascular;  Laterality: N/A;   FIDUCIAL MARKER PLACEMENT  01/15/2022   Procedure: FIDUCIAL MARKER PLACEMENT;  Surgeon: [name], MD;  Location: [place];  Service: Pulmonary;;   FINE NEEDLE ASPIRATION  01/15/2022   Procedure: FINE NEEDLE ASPIRATION;  Surgeon: [name], MD;  Location: [place];  Service: Pulmonary;;    HEMOSTASIS CONTROL  01/15/2022   Procedure: HEMOSTASIS CONTROL;  Surgeon: [name], MD;  Location: [facility] ENDOSCOPY;  Service: Pulmonary;;   KNEE ARTHROSCOPY Right 2010   KNEE ARTHROSCOPY WITH SUBCHONDROPLASTY Left 01/09/2020   Procedure: Left knee arthroscopy,partial medial meniscectomy, debridement, subchondroplasty left proximal tibia;  Surgeon: Susa Day, MD;  Location: [name];  Service: Orthopedics;  Laterality: Left;   LEFT HEART CATH AND CORONARY ANGIOGRAPHY N/A 02/19/2017   Procedure: LEFT HEART CATH AND CORONARY ANGIOGRAPHY;  Surgeon: [name], Peter M, MD;  Location: [place] [facility];  Service: Cardiovascular;  Laterality: N/A;   TONSILLECTOMY     TUBAL LIGATION     VIDEO BRONCHOSCOPY WITH ENDOBRONCHIAL ULTRASOUND N/A 01/15/2022   Procedure: VIDEO BRONCHOSCOPY WITH ENDOBRONCHIAL ULTRASOUND;  Surgeon: [name], MD;  Location: [facility] ENDOSCOPY;  Service: Pulmonary;  Laterality: N/A;   VIDEO BRONCHOSCOPY WITH RADIAL ENDOBRONCHIAL ULTRASOUND  01/15/2022   Procedure: RADIAL ENDOBRONCHIAL ULTRASOUND;  Surgeon: [name], MD;  Location: [facility] ENDOSCOPY;  Service: Pulmonary;;    FAMILY HISTORY:  Family History  Problem Relation Age of Onset   Diabetes Mother    Heart attack Sister     SOCIAL HISTORY:  Social History   Tobacco Use  Smoking status: Some Days    Packs/day: 1.50    Years: 50.00    Total pack years: 75.00    Types: Cigarettes   Smokeless tobacco: Never   Tobacco comments:    Trying to quit, currently smokes 0.5 packs a day 11/17/21  Vaping Use   Vaping Use: Never used  Substance Use Topics   Alcohol use: Yes    Comment: occasional   Drug use: Never    ALLERGIES:  Allergies  Allergen Reactions   Codeine Nausea Only    MEDICATIONS:  Current Outpatient Medications  Medication Sig Dispense Refill   acetaminophen (TYLENOL) 500 MG tablet Take 500 mg by mouth 2 (two) times daily as needed for moderate pain.     aspirin 81 MG chewable  tablet Chew 1 tablet (81 mg total) by mouth daily.     atorvastatin (LIPITOR) 80 MG tablet TAKE 1 TABLET BY MOUTH DAILY AT 6 PM. 90 tablet 3   Coenzyme Q10 (CO Q 10) 100 MG CAPS Take 100-200 mg by mouth daily in the afternoon.     diclofenac Sodium (VOLTAREN) 1 % GEL Apply 2 g topically daily as needed (Knee pain).      ezetimibe (ZETIA) 10 MG tablet TAKE 1 TABLET BY MOUTH EVERY DAY (Patient taking differently: Takes at night) 90 tablet 3   irbesartan (AVAPRO) 300 MG tablet Take 1 tablet (300 mg total) by mouth daily. Patient need a appointment for future refills. 90 tablet 1   metoprolol tartrate (LOPRESSOR) 25 MG tablet TAKE 1 TABLET BY MOUTH TWICE A DAY 180 tablet 3   nitroGLYCERIN (NITROSTAT) 0.4 MG SL tablet Place 1 tablet (0.4 mg total) under the tongue every 5 (five) minutes x 3 doses as needed for chest pain. 25 tablet 2   Polyethyl Glycol-Propyl Glycol (SYSTANE FREE OP) Place 2 drops into both eyes daily as needed (Floaters).      traMADol (ULTRAM) 50 MG tablet Take 50 mg by mouth at bedtime.     trolamine salicylate (ASPERCREME) 10 % cream Apply 1 application topically daily as needed (Knee pain).     No current facility-administered medications for this encounter.    REVIEW OF SYSTEMS:  A 10+ POINT REVIEW OF SYSTEMS WAS OBTAINED including neurology, dermatology, psychiatry, cardiac, respiratory, lymph, extremities, GI, GU, musculoskeletal, constitutional, reproductive, HEENT. ***   PHYSICAL EXAM:  vitals were not taken for this visit.   General: Alert and oriented, in no acute distress HEENT: Head is normocephalic. Extraocular movements are intact. Oropharynx is clear. Neck: Neck is supple, no palpable cervical or supraclavicular lymphadenopathy. Heart: Regular in rate and rhythm with no murmurs, rubs, or gallops. Chest: Clear to auscultation bilaterally, with no rhonchi, wheezes, or rales. Abdomen: Soft, nontender, nondistended, with no rigidity or guarding. Extremities: No  cyanosis or edema. Lymphatics: see Neck Exam Skin: No concerning lesions. Musculoskeletal: symmetric strength and muscle tone throughout. Neurologic: Cranial nerves II through XII are grossly intact. No obvious focalities. Speech is fluent. Coordination is intact. Psychiatric: Judgment and insight are intact. Affect is appropriate. ***  ECOG = ***  0 - Asymptomatic (Fully active, able to carry on all predisease activities without restriction)  1 - Symptomatic but completely ambulatory (Restricted in physically strenuous activity but ambulatory and able to carry out work of a light or sedentary nature. For example, light housework, office work)  2 - Symptomatic, <50% in bed during the day (Ambulatory and capable of all self care but unable to carry out any work activities.  Up and about more than 50% of waking hours)  3 - Symptomatic, >50% in bed, but not bedbound (Capable of only limited self-care, confined to bed or chair 50% or more of waking hours)  4 - Bedbound (Completely disabled. Cannot carry on any self-care. Totally confined to bed or chair)  5 - Death   Eustace Pen MM, Creech RH, Tormey DC, et al. [phone]). "Toxicity and response criteria of the [facility]". [place] Oncol. 5 (6): 649-55  LABORATORY DATA:  Lab Results  Component Value Date   WBC 9.2 01/15/2022   HGB 15.1 (H) 01/15/2022   HCT 45.3 01/15/2022   MCV 92.1 01/15/2022   PLT 250 01/15/2022   NEUTROABS 5.1 10/26/2021   Lab Results  Component Value Date   NA 139 01/15/2022   K 4.3 01/15/2022   CL 105 01/15/2022   CO2 25 01/15/2022   GLUCOSE 126 (H) 01/15/2022   BUN 8 01/15/2022   CREATININE 0.86 01/15/2022   CALCIUM 9.1 01/15/2022      RADIOGRAPHY: DG Chest Port 1 View  Result Date: 01/15/2022 CLINICAL DATA:  02637 status post LEFT-sided lung biopsy EXAM: PORTABLE CHEST 1 VIEW COMPARISON:  CT dated January 08, 2022 FINDINGS: The cardiomediastinal silhouette is unchanged in  contour.Atherosclerotic calcifications. No pleural effusion. No significant pneumothorax. LEFT-sided pulmonary nodule better assessed on prior CT. Mildly increased adjacent airspace haziness, likely post biopsy changes. Visualized abdomen is unremarkable. IMPRESSION: No significant pneumothorax status post pulmonary nodule biopsy. Mildly increased airspace opacification adjacent to the nodule, likely reflecting post biopsy changes. Electronically Signed   By: Valentino Saxon M.D.   On: 01/15/2022 10:02   DG C-ARM BRONCHOSCOPY  Result Date: 01/15/2022 C-ARM BRONCHOSCOPY: Fluoroscopy was utilized by the requesting physician.  No radiographic interpretation.   NM PET SUPER D CT  Result Date: 01/10/2022 CLINICAL DATA:  Evaluate left lung mass. EXAM: CT CHEST WITHOUT CONTRAST TECHNIQUE: Multidetector CT imaging of the chest was performed using thin slice collimation for electromagnetic bronchoscopy planning purposes, without intravenous contrast. RADIATION DOSE REDUCTION: This exam was performed according to the departmental dose-optimization program which includes automated exposure control, adjustment of the mA and/or kV according to patient size and/or use of iterative reconstruction technique. ***body onc*** COMPARISON:  CT chest 11/24/2021, PET-CT 01/08/2022 FINDINGS: Cardiovascular: Normal heart size. Aortic atherosclerosis. Coronary artery atherosclerotic calcifications identified. No pericardial effusion. Mediastinum/Nodes: No enlarged axillary or supraclavicular lymph nodes. No enlarged right paratracheal, subcarinal lymph nodes. Tracer avid left paratracheal lymph node identified on the PET-CT from today measures 0.8 cm short axis, image 23/2. The tracer avid left hilar lymph nodes from today's PET-CT are suboptimally visualized due to lack of IV contrast material. Within this limitation, the more superior hypermetabolic left hilar lymph node measures approximately 1.2 cm, image 27/2. The more  inferior lymph node measures approximately 0.8 cm, image 31/2. Lungs/Pleura: No pleural effusion or airspace consolidation. Posterolateral left upper lobe lung mass abuts the oblique fissure and the left lateral pleural surface measuring 3.4 x 2.6 cm, image 70/4. On 11/24/2021 this measured 3.5 x 2.1 cm. Stable 3 mm lung nodule within the lingula, image 85/4. Additional unchanged, less than 5 mm, nodules are noted within the right upper lobe and left apex, nonspecific. Upper Abdomen: No acute abnormality. Diffuse hepatic steatosis. Previous cholecystectomy. Bilateral low-attenuation adrenal nodules are compatible with benign adenomas, image 62/2. No follow-up imaging recommended. Partially visualized, fluid attenuating cyst arising off the left kidney are noted measuring up to 6.2 cm. No follow-up  imaging recommended. Musculoskeletal: Left lateral rib fractures are identified involving the seventh, eighth and ninth ribs as noted previously no acute or suspicious osseous findings. IMPRESSION: 1. Mild increase in size of FDG avid left upper lobe lung mass which abuts the left lateral pleural surface and oblique fissure. 2. The tracer avid left paratracheal and left hilar lymph nodes from today's PET-CT are suboptimally visualized due to lack of IV contrast material. 3. Small nonspecific pulmonary nodules are noted within both lungs. These measure less than 5 mm and appear unchanged from 11/24/2021. 4. Hepatic steatosis. 5. Bilateral adrenal gland adenomas. 6. Coronary artery calcifications. 7. Left lateral rib fractures.  Unchanged. 8. Aortic Atherosclerosis (ICD10-I70.0). Electronically Signed   By: Kerby Moors M.D.   On: 01/10/2022 07:15   NM PET Image Initial (PI) Skull Base To Thigh  Result Date: 01/10/2022 CLINICAL DATA:  Initial treatment strategy for left lung mass. EXAM: NUCLEAR MEDICINE PET SKULL BASE TO THIGH TECHNIQUE: 9.6 mCi F-18 FDG was injected intravenously. Full-ring PET imaging was performed  from the skull base to thigh after the radiotracer. CT data was obtained and used for attenuation correction and anatomic localization. Fasting blood glucose: 118 mg/dl COMPARISON:  CT chest 11/24/2021 FINDINGS: Mediastinal blood pool activity: SUV max 2.81 Liver activity: SUV max NA NECK: No hypermetabolic lymph nodes in the neck. Incidental CT findings: None. CHEST: There is a 2.9 x 2.7 cm subpleural mass within the posterior left upper lobe which abuts the oblique fissure and left posterolateral pleural, image 29/7. The corresponding SUV max is equal to 11.45. There are no additional tracer avid pulmonary nodules. Small nodule within the inferior left lower lobe measures 3 mm and is too small to characterize, image 37/7. Tracer avid left hilar lymph node has an SUV max of 8.43, image 68 of the fused PET-CT images. Also in the left hilum is a tracer avid lymph node with SUV max 5.96, image 60/4. Low left paratracheal lymph node measures approximately 1.1 cm short axis with SUV max of 7.62, image 58/4. No tracer avid supraclavicular or axillary lymph nodes. Incidental CT findings: Aortic atherosclerosis. Coronary artery calcifications. Calcified mediastinal and hilar lymph nodes compatible with prior granulomatous disease. ABDOMEN/PELVIS: No abnormal hypermetabolic activity within the liver, pancreas, adrenal glands, or spleen. No hypermetabolic lymph nodes in the abdomen or pelvis. Incidental CT findings: Bilateral adrenal adenomas are identified. Left adrenal adenoma measures 2.9 cm and -4.1 Hounsfield units, image 104/4. Right adrenal adenoma measures 1.8 by 1.5 cm with Hounsfield units of -12.74. No follow-up imaging recommended. There are several bilateral simple appearing kidney cysts. The largest arises off the upper pole of the left kidney measuring 5.9 cm. No follow-up imaging recommended. 1.2 cm nonobstructing left renal calculus. Hepatic steatosis. Focal fatty sparing identified within the posterior  aspect of the lateral segment of left hepatic lobe. Status post cholecystectomy. Aortic atherosclerosis calcifications. SKELETON: No focal hypermetabolic activity to suggest skeletal metastasis. Incidental CT findings: There is a fracture involving the lateral aspect of the left seventh, eighth, ninth ribs with corresponding posttraumatic uptake. The SUV max associated with the left ninth rib is equal to 4.75. IMPRESSION: 1. The left upper lobe lung mass abutting the oblique fissure and left lateral pleural surface is FDG avid and concerning for primary bronchogenic carcinoma. This measures 2.9 cm with SUV max of 11.45. 2. Tracer avid left hilar and left mediastinal lymph nodes compatible with nodal metastasis. 3. No signs of tracer avid distant metastatic disease. 4. Incidental left seventh, eighth,  and ninth rib fractures with corresponding posttraumatic uptake. 5. Hepatic steatosis. 6. Left kidney stone. 7. Aortic Atherosclerosis (ICD10-I70.0). Coronary artery calcifications. Electronically Signed   By: Kerby Moors M.D.   On: 01/10/2022 06:30      IMPRESSION: Adenocarcinoma of the left upper lobe   ***  Today, I talked to the patient and family about the findings and work-up thus far.  We discussed the natural history of *** and general treatment, highlighting the role of radiotherapy in the management.  We discussed the available radiation techniques, and focused on the details of logistics and delivery.  We reviewed the anticipated acute and late sequelae associated with radiation in this setting.  The patient was encouraged to ask questions that I answered to the best of my ability. *** A patient consent form was discussed and signed.  We retained a copy for our records.  The patient would like to proceed with radiation and will be scheduled for CT simulation.  PLAN: ***    *** minutes of total time was spent for this patient encounter, including preparation, face-to-face counseling with the  patient and coordination of care, physical exam, and documentation of the encounter.   ------------------------------------------------  Blair Promise, PhD, MD  This document serves as a record of services personally performed by Gery Pray, MD. It was created on his behalf by Roney Mans, a trained medical scribe. The creation of this record is based on the scribe's personal observations and the provider's statements to them. This document has been checked and approved by the attending provider.

## 2022-01-20 NOTE — Telephone Encounter (Signed)
Called and discussed results. Can we schedule her urgently for full PFT?  Thanks!

## 2022-01-20 NOTE — Progress Notes (Signed)
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 LYMPH NODE, 4L, FINE NEEDLE ASPIRATION:  - Negative for malignancy  - Compatible with a benign lymph node  - Numerous benign bronchial cells with acute inflammatory cells and  pulmonary macrophages   E. LYMPH NODE, 11L, FINE NEEDLE ASPIRATION:  - Suspicious  - Rare atypical large cells suspicious for tumor   C. LUNG, LUL, LAVAGE: FINAL MICROSCOPIC DIAGNOSIS:  - Malignant cells consistent with adenocarcinoma   FINAL MICROSCOPIC DIAGNOSIS:  A.  LEFT LUNG, UPPER LOBE, FINE NEEDLE ASPIRATION:  - Malignant  - Adenocarcinoma   B.  LEFT LUNG, UPPER LOBE, BRUSHING:  - Suspicious  - Rare atypical large cells suspicious for tumor  - Background pulmonary macrophages with scattered benign bronchial cells  and inflammatory cells   Tobacco/Marijuana/Snuff/ETOH use: {:18581}  Past/Anticipated interventions by cardiothoracic surgery, if any: Has an appointment with Dr. Roxan Hockey on 02/09/22.  Past/Anticipated interventions by medical oncology, if any: {:18581}  Signs/Symptoms Weight changes, if any: {:18581} Respiratory complaints, if any: {:18581} Hemoptysis, if any: {:18581} Pain issues, if any:  {:18581}  SAFETY ISSUES: Prior radiation? {:18581} Pacemaker/ICD? {:18581}  Possible current pregnancy?no Is the patient on methotrexate? {:18581}  Current Complaints / other details:  ***

## 2022-01-20 NOTE — Telephone Encounter (Signed)
Schedule dpt for PFT on 9/22 at 10am.

## 2022-01-20 NOTE — Telephone Encounter (Signed)
Spoke to pt to give her MRI appt info.  She states she was tied up when Dr Verlee Monte called her this morning but she is available now and would like to speak to him about results.  Routing message back to Dr Verlee Monte.

## 2022-01-21 ENCOUNTER — Telehealth: Payer: Self-pay | Admitting: Internal Medicine

## 2022-01-21 ENCOUNTER — Other Ambulatory Visit: Payer: Self-pay | Admitting: *Deleted

## 2022-01-21 ENCOUNTER — Encounter: Payer: Self-pay | Admitting: Radiation Oncology

## 2022-01-21 ENCOUNTER — Ambulatory Visit
Admission: RE | Admit: 2022-01-21 | Discharge: 2022-01-21 | Disposition: A | Discharge status: Home / Self Care | Payer: Medicare Other | Source: Ambulatory Visit | Attending: Radiation Oncology | Admitting: Radiation Oncology

## 2022-01-21 VITALS — BP 127/69 | HR 80 | Temp 97.8°F | Resp 20 | Ht 63.0 in | Wt 192.4 lb

## 2022-01-21 DIAGNOSIS — I252 Old myocardial infarction: Secondary | ICD-10-CM | POA: Diagnosis not present

## 2022-01-21 DIAGNOSIS — Z7982 Long term (current) use of aspirin: Secondary | ICD-10-CM | POA: Diagnosis not present

## 2022-01-21 DIAGNOSIS — I10 Essential (primary) hypertension: Secondary | ICD-10-CM | POA: Diagnosis not present

## 2022-01-21 DIAGNOSIS — E785 Hyperlipidemia, unspecified: Secondary | ICD-10-CM | POA: Diagnosis not present

## 2022-01-21 DIAGNOSIS — F17210 Nicotine dependence, cigarettes, uncomplicated: Secondary | ICD-10-CM | POA: Insufficient documentation

## 2022-01-21 DIAGNOSIS — Z79899 Other long term (current) drug therapy: Secondary | ICD-10-CM | POA: Diagnosis not present

## 2022-01-21 DIAGNOSIS — C3412 Malignant neoplasm of upper lobe, left bronchus or lung: Secondary | ICD-10-CM | POA: Insufficient documentation

## 2022-01-21 DIAGNOSIS — Z801 Family history of malignant neoplasm of trachea, bronchus and lung: Secondary | ICD-10-CM | POA: Diagnosis not present

## 2022-01-21 DIAGNOSIS — I2510 Atherosclerotic heart disease of native coronary artery without angina pectoris: Secondary | ICD-10-CM | POA: Diagnosis not present

## 2022-01-21 DIAGNOSIS — R911 Solitary pulmonary nodule: Secondary | ICD-10-CM

## 2022-01-21 NOTE — Telephone Encounter (Signed)
Scheduled appt per 9/20 referral. Earlier appt was offered to pt on 9/29, however pt declined, requested to sch next available. Pt is aware of appt date and time. Pt is aware to arrive 15 mins prior to appt time and to bring and updated insurance card. Pt is aware of appt location.

## 2022-01-21 NOTE — Progress Notes (Signed)
The proposed treatment discussed in conference is for discussion purpose only and is not a binding recommendation.  The patients have not been physically examined, or presented with their treatment options.  Therefore, final treatment plans cannot be decided.  

## 2022-01-22 ENCOUNTER — Telehealth: Payer: Self-pay | Admitting: Student

## 2022-01-22 DIAGNOSIS — C3492 Malignant neoplasm of unspecified part of left bronchus or lung: Secondary | ICD-10-CM

## 2022-01-22 NOTE — Telephone Encounter (Signed)
Ordered PFTs at [facility] to see if we can get it done any sooner. I did call and talk about results of tumor board discussion of her case though where it was likely that her treatment would likely be concurrent chemo/radiation.

## 2022-01-22 NOTE — Telephone Encounter (Signed)
Patient had to cancel PFT that were to be done today 9/22. Next PFT is not until 10/30. Patient believes she needs the PFT prior to seeing Dr. Roxan Hockey on 10/10. Please advise.  Sir is it ok for her to get PFT after the visit with Dr Roxan Hockey.   I advised patient to call that doctor to ask but she wanted me to ask you instead.  Please advise sir

## 2022-01-22 NOTE — Telephone Encounter (Signed)
Patient had to cancel PFT today, 9/22 due to not feeling well. The next opening for a PFT is not until 10/30. Patient believes she needs the PFT prior to seeing Dr. Roxan Hockey on 10/10. Please advise.

## 2022-01-27 ENCOUNTER — Ambulatory Visit (HOSPITAL_COMMUNITY)
Admission: RE | Admit: 2022-01-27 | Discharge: 2022-01-27 | Disposition: A | Discharge status: Home / Self Care | Payer: Medicare Other | Source: Ambulatory Visit | Attending: Student | Admitting: Student

## 2022-01-27 ENCOUNTER — Encounter (HOSPITAL_COMMUNITY): Payer: Self-pay

## 2022-01-27 DIAGNOSIS — C3492 Malignant neoplasm of unspecified part of left bronchus or lung: Secondary | ICD-10-CM | POA: Diagnosis not present

## 2022-01-27 DIAGNOSIS — C3490 Malignant neoplasm of unspecified part of unspecified bronchus or lung: Secondary | ICD-10-CM | POA: Insufficient documentation

## 2022-01-27 MED ORDER — GADOPICLENOL 0.5 MMOL/ML IV SOLN
8.0000 mL | Freq: Once | INTRAVENOUS | Status: AC | PRN
  Administered 2022-01-27: 8 mL via INTRAVENOUS

## 2022-01-29 ENCOUNTER — Ambulatory Visit (HOSPITAL_COMMUNITY)
Admission: RE | Admit: 2022-01-29 | Discharge: 2022-01-29 | Disposition: A | Discharge status: Home / Self Care | Payer: Medicare Other | Source: Ambulatory Visit | Attending: Student | Admitting: Student

## 2022-01-29 DIAGNOSIS — R0600 Dyspnea, unspecified: Secondary | ICD-10-CM | POA: Diagnosis not present

## 2022-01-29 DIAGNOSIS — C3492 Malignant neoplasm of unspecified part of left bronchus or lung: Secondary | ICD-10-CM | POA: Diagnosis not present

## 2022-01-29 DIAGNOSIS — F17210 Nicotine dependence, cigarettes, uncomplicated: Secondary | ICD-10-CM | POA: Insufficient documentation

## 2022-01-29 LAB — PULMONARY FUNCTION TEST
DL/VA % pred: 99 %
DL/VA: 4.13 ml/min/mmHg/L
DLCO cor % pred: 89 %
DLCO cor: 16.63 ml/min/mmHg
DLCO unc % pred: 93 %
DLCO unc: 17.44 ml/min/mmHg
FEF 25-75 Post: 1.73 L/sec
FEF 25-75 Pre: 1.51 L/sec
FEF2575-%Change-Post: 14 %
FEF2575-%Pred-Post: 101 %
FEF2575-%Pred-Pre: 88 %
FEV1-%Change-Post: 5 %
FEV1-%Pred-Post: 87 %
FEV1-%Pred-Pre: 82 %
FEV1-Post: 1.82 L
FEV1-Pre: 1.73 L
FEV1FVC-%Change-Post: -5 %
FEV1FVC-%Pred-Pre: 100 %
FEV6-%Change-Post: 14 %
FEV6-%Pred-Post: 96 %
FEV6-%Pred-Pre: 84 %
FEV6-Post: 2.56 L
FEV6-Pre: 2.24 L
FEV6FVC-%Change-Post: 0 %
FEV6FVC-%Pred-Post: 105 %
FEV6FVC-%Pred-Pre: 104 %
FVC-%Change-Post: 11 %
FVC-%Pred-Post: 92 %
FVC-%Pred-Pre: 82 %
FVC-Post: 2.56 L
FVC-Pre: 2.29 L
Post FEV1/FVC ratio: 71 %
Post FEV6/FVC ratio: 100 %
Pre FEV1/FVC ratio: 75 %
Pre FEV6/FVC Ratio: 99 %
RV % pred: 98 %
RV: 2.17 L
TLC % pred: 93 %
TLC: 4.59 L

## 2022-01-29 MED ORDER — ALBUTEROL SULFATE (2.5 MG/3ML) 0.083% IN NEBU
2.5000 mg | INHALATION_SOLUTION | Freq: Once | RESPIRATORY_TRACT | Status: AC
  Administered 2022-01-29: 2.5 mg via RESPIRATORY_TRACT

## 2022-02-03 ENCOUNTER — Other Ambulatory Visit: Payer: Self-pay | Admitting: Radiation Therapy

## 2022-02-03 ENCOUNTER — Telehealth: Payer: Self-pay | Admitting: Radiation Therapy

## 2022-02-03 DIAGNOSIS — C7931 Secondary malignant neoplasm of brain: Secondary | ICD-10-CM

## 2022-02-03 NOTE — Telephone Encounter (Signed)
Spoke with pt about her upcoming MRI on 10/6. I let her know that we will review her scan during the brain and spine conference Monday 10/9 and I will reach back out to her regarding that discussion and next steps.   Mont Dutton R.T(R)(T) Radiation Special Procedures Navigator

## 2022-02-04 ENCOUNTER — Telehealth: Payer: Self-pay | Admitting: Cardiology

## 2022-02-04 NOTE — Telephone Encounter (Signed)
Attempted to call patient, left message for patient to call back to office.   Will forward to MD to make aware.

## 2022-02-04 NOTE — Telephone Encounter (Signed)
Patient called wanting to report she has been diagnosed with cancer.

## 2022-02-05 ENCOUNTER — Ambulatory Visit
Admission: RE | Admit: 2022-02-05 | Discharge: 2022-02-05 | Disposition: A | Discharge status: Home / Self Care | Payer: Medicare Other | Source: Ambulatory Visit | Attending: Radiation Oncology | Admitting: Radiation Oncology

## 2022-02-05 DIAGNOSIS — C7931 Secondary malignant neoplasm of brain: Secondary | ICD-10-CM

## 2022-02-05 MED ORDER — GADOPICLENOL 0.5 MMOL/ML IV SOLN
9.0000 mL | Freq: Once | INTRAVENOUS | Status: AC | PRN
  Administered 2022-02-05: 9 mL via INTRAVENOUS

## 2022-02-05 NOTE — Telephone Encounter (Signed)
Called patient left message on personal voice mail to call me back.Advised she is due to schedule follow up appointment with Dr.Jordan.

## 2022-02-08 ENCOUNTER — Other Ambulatory Visit: Payer: Self-pay

## 2022-02-08 ENCOUNTER — Inpatient Hospital Stay: Payer: Medicare Other

## 2022-02-08 ENCOUNTER — Inpatient Hospital Stay: Payer: Medicare Other | Admitting: Internal Medicine

## 2022-02-08 ENCOUNTER — Other Ambulatory Visit: Payer: Self-pay | Admitting: Medical Oncology

## 2022-02-08 VITALS — BP 152/71 | HR 66 | Temp 98.2°F | Resp 14 | Wt 194.2 lb

## 2022-02-08 DIAGNOSIS — C3412 Malignant neoplasm of upper lobe, left bronchus or lung: Secondary | ICD-10-CM

## 2022-02-08 DIAGNOSIS — F17210 Nicotine dependence, cigarettes, uncomplicated: Secondary | ICD-10-CM

## 2022-02-08 DIAGNOSIS — Z5111 Encounter for antineoplastic chemotherapy: Secondary | ICD-10-CM | POA: Insufficient documentation

## 2022-02-08 DIAGNOSIS — E279 Disorder of adrenal gland, unspecified: Secondary | ICD-10-CM | POA: Diagnosis not present

## 2022-02-08 DIAGNOSIS — Z5112 Encounter for antineoplastic immunotherapy: Secondary | ICD-10-CM | POA: Insufficient documentation

## 2022-02-08 DIAGNOSIS — C3432 Malignant neoplasm of lower lobe, left bronchus or lung: Secondary | ICD-10-CM | POA: Insufficient documentation

## 2022-02-08 DIAGNOSIS — R911 Solitary pulmonary nodule: Secondary | ICD-10-CM

## 2022-02-08 DIAGNOSIS — Z801 Family history of malignant neoplasm of trachea, bronchus and lung: Secondary | ICD-10-CM | POA: Insufficient documentation

## 2022-02-08 DIAGNOSIS — Z720 Tobacco use: Secondary | ICD-10-CM | POA: Insufficient documentation

## 2022-02-08 DIAGNOSIS — I10 Essential (primary) hypertension: Secondary | ICD-10-CM

## 2022-02-08 DIAGNOSIS — Z79899 Other long term (current) drug therapy: Secondary | ICD-10-CM | POA: Insufficient documentation

## 2022-02-08 DIAGNOSIS — C7931 Secondary malignant neoplasm of brain: Secondary | ICD-10-CM | POA: Insufficient documentation

## 2022-02-08 DIAGNOSIS — C3492 Malignant neoplasm of unspecified part of left bronchus or lung: Secondary | ICD-10-CM | POA: Insufficient documentation

## 2022-02-08 DIAGNOSIS — C771 Secondary and unspecified malignant neoplasm of intrathoracic lymph nodes: Secondary | ICD-10-CM | POA: Diagnosis not present

## 2022-02-08 DIAGNOSIS — I252 Old myocardial infarction: Secondary | ICD-10-CM

## 2022-02-08 LAB — CMP (CANCER CENTER ONLY)
ALT: 16 U/L (ref 0–44)
AST: 17 U/L (ref 15–41)
Albumin: 4.1 g/dL (ref 3.5–5.0)
Alkaline Phosphatase: 97 U/L (ref 38–126)
Anion gap: 5 (ref 5–15)
BUN: 9 mg/dL (ref 8–23)
CO2: 29 mmol/L (ref 22–32)
Calcium: 9.1 mg/dL (ref 8.9–10.3)
Chloride: 106 mmol/L (ref 98–111)
Creatinine: 0.89 mg/dL (ref 0.44–1.00)
GFR, Estimated: >60 mL/min (ref >60)
Glucose, Bld: 211 mg/dL — ABNORMAL HIGH (ref 70–99)
Potassium: 4 mmol/L (ref 3.5–5.1)
Sodium: 140 mmol/L (ref 135–145)
Total Bilirubin: 0.4 mg/dL (ref 0.3–1.2)
Total Protein: 7.3 g/dL (ref 6.5–8.1)

## 2022-02-08 LAB — CBC WITH DIFFERENTIAL (CANCER CENTER ONLY)
Abs Immature Granulocytes: 0.04 10*3/uL (ref 0.00–0.07)
Basophils Absolute: 0.1 10*3/uL (ref 0.0–0.1)
Basophils Relative: 1 %
Eosinophils Absolute: 0.1 10*3/uL (ref 0.0–0.5)
Eosinophils Relative: 2 %
HCT: 42.9 % (ref 36.0–46.0)
Hemoglobin: 14.5 g/dL (ref 12.0–15.0)
Immature Granulocytes: 1 %
Lymphocytes Relative: 22 %
Lymphs Abs: 1.8 10*3/uL (ref 0.7–4.0)
MCH: 30.4 pg (ref 26.0–34.0)
MCHC: 33.8 g/dL (ref 30.0–36.0)
MCV: 89.9 fL (ref 80.0–100.0)
Monocytes Absolute: 0.5 10*3/uL (ref 0.1–1.0)
Monocytes Relative: 6 %
Neutro Abs: 5.6 10*3/uL (ref 1.7–7.7)
Neutrophils Relative %: 68 %
Platelet Count: 223 10*3/uL (ref 150–400)
RBC: 4.77 MIL/uL (ref 3.87–5.11)
RDW: 13.2 % (ref 11.5–15.5)
WBC Count: 8.1 10*3/uL (ref 4.0–10.5)
nRBC: 0 % (ref 0.0–0.2)

## 2022-02-08 NOTE — Progress Notes (Signed)
[place] [phone]   [phone]  CONSULT NOTE  REFERRING PHYSICIAN: Dr. Leslye Peer  REASON FOR CONSULTATION:  71 years old white female recently diagnosed with lung cancer.  HPI Kimberly Huang is a 71 y.o. female with past medical history significant for hypertension, dyslipidemia, coronary artery disease status postmyocardial infarction as well as osteoarthritis and long history of smoking.  The patient mentions that in June 2023 she felt left-sided rib pain and chest x-ray was performed on 10/26/2021 and that showed new nodular focal opacity in the left upper lobe measuring 2.7 cm.  She had repeat chest x-ray on 11/17/2021 and it showed the previously noted new prominent mass lesions/rounded infiltrate in the left upper lobe without interim changes.  CT scan of the chest with contrast on 11/24/2021 showed 3.3 x 2.1 cm left upper lobe pulmonary mass with associated left hilar and mediastinal lymphadenopathy.  The findings were concerning for malignancy.  There was left upper lobe noncalcified 0.4 cm pulmonary nodule and couple of calcified pulmonary micro nodules within the left upper lobe as well as the right lower lobe.  There was bilateral indeterminate adrenal nodules measuring 2.8 x 1.7 on the left and 2.1 x 1.3 cm on the right likely adrenal lesions versus metastasis.  The partially visualized fluid density lesion within the left kidney with 0.8 cm calcification.  The patient had a PET scan on 01/08/2022 and it showed the left upper lobe lung mass abutting the oblique fissure and left lateral pleural surface is FDG avid and concerning for primary bronchogenic carcinoma.  This measured 2.9 cm with SUV max of 11.45.  There was tracer avid left hilar and left mediastinal nodes compatible with nodal metastasis and no signs of tracer avid distant metastatic disease.  There was incidental left seventh, eighth and ninth rib fracture corresponding to post  traumatic uptake.  On 01/15/2022 the patient underwent video bronchoscopy with robotic assisted bronchoscopic navigation under the care of Dr. Verlee Monte.  The final pathology from the fine-needle aspiration of the left upper lobe mass ([id number]) showed malignant cells consistent with adenocarcinoma. Immunohistochemical stains performed on the concurrent fine-needle aspiration ([id number]) show the tumor to be positive for the pulmonary adeno marker TTF-1.  The fine-needle aspiration of 11 L lymph node was suspicious with rare atypical large cells suspicious for tumor.  PD-L1 expression was 93% and the molecular studies showed positive KRAS G12C mutation. The patient had MRI of the brain on initially on 01/27/2022 and that showed widespread metastatic disease to the brain.  Most of the lesion are small without significant edema or midline shift and no associated hemorrhage.  Repeat MRI of the brain on February 05, 2022 showed multiple intracranial metastasis.  There are small lesions within the left frontal lobe and left lentiform nucleus that were not definitely present on the prior MRI of 01/27/2022.  Additionally there are enhancing lesion within the left parietal lobe, right postcentral gyrus, medial left cerebellar hemisphere/cerebellar vermis and right cerebellar hemisphere which have slightly increased in size.  There was a 2 mm focus of enhancement within the anterior lateral left frontal lobe which may reflect a metastasis or vascular enhancement. The patient was referred to me today for evaluation and recommendation regarding treatment of her condition. When seen today she is feeling fine with no concerning complaints.  She denied having any current chest pain, shortness of breath, cough or hemoptysis.  She denied having any nausea, vomiting, diarrhea or constipation.  She  has no recent weight loss or night sweats.  She has no headache or visual changes. Family history significant for mother with diabetes  mellitus and coronary artery disease.  Father had lung cancer and stroke. The patient is a widow and her husband died few months ago.  She has 3 children.  They on [employer] store.  She has a history for smoking 1 pack/day for around 50 years and still smoking.  She has occasional alcohol and no history of drug abuse.  HPI  Past Medical History:  Diagnosis Date   Arthritis    knee, hands   CAD S/P PCI OM1 99%-0%; Plan Staged PCI mRCA 90%. 02/19/2017   1. Severe 2 vessel obstructive CAD    - 99% thrombotic occlusion of first OM. This is a bifurcating vessel.     - 90% mid RCA-segmental 2. Good overall LV dysfunction with lateral HK 3. Moderately elevated LVEDP 4. Successful stenting of the first OM with DES. Distal embolization into the distal lateral OM branch.   Plan: DAPT for one year. Will treat with IV Aggrastat for 18 hours. Start high dose statin, beta blocker. IV Ntg for BP control acutely. Plan for stage PCI of RCA on Monday 16/10 if no complication.   Essential hypertension 02/21/2017   Hyperlipidemia with target LDL less than 70 02/21/2017   Presence of drug-eluting stent in L Cx: OM1 (Xience SIERRA DES 3.5 x 15) 02/21/2017   STEMI involving left circumflex coronary artery (Micro) 02/19/2017   Tobacco abuse 02/19/2017    Past Surgical History:  Procedure Laterality Date   BRONCHIAL BIOPSY  01/15/2022   Procedure: BRONCHIAL BIOPSIES;  Surgeon: [name], MD;  Location: [facility] ENDOSCOPY;  Service: Pulmonary;;   BRONCHIAL BRUSHINGS  01/15/2022   Procedure: BRONCHIAL BRUSHINGS;  Surgeon: [name], MD;  Location: [facility] ENDOSCOPY;  Service: Pulmonary;;   BRONCHIAL NEEDLE ASPIRATION BIOPSY  01/15/2022   Procedure: BRONCHIAL NEEDLE ASPIRATION BIOPSIES;  Surgeon: [name], MD;  Location: [facility] ENDOSCOPY;  Service: Pulmonary;;   CARDIAC CATHETERIZATION  02/21/2017   CHOLECYSTECTOMY     CORONARY STENT INTERVENTION N/A 02/19/2017   Procedure: CORONARY STENT  INTERVENTION;  Surgeon: [name], Peter M, MD;  Location: [place] CV LAB;  Service: Cardiovascular;  Laterality: N/A;   CORONARY STENT INTERVENTION N/A 02/21/2017   Procedure: CORONARY STENT INTERVENTION;  Surgeon: Belva Crome, MD;  Location: [place] CV LAB;  Service: Cardiovascular;  Laterality: N/A;   FIDUCIAL MARKER PLACEMENT  01/15/2022   Procedure: FIDUCIAL MARKER PLACEMENT;  Surgeon: [name], MD;  Location: [place];  Service: Pulmonary;;   FINE NEEDLE ASPIRATION  01/15/2022   Procedure: FINE NEEDLE ASPIRATION;  Surgeon: [name], MD;  Location: [place];  Service: Pulmonary;;   HEMOSTASIS CONTROL  01/15/2022   Procedure: HEMOSTASIS CONTROL;  Surgeon: [name], MD;  Location: [facility] ENDOSCOPY;  Service: Pulmonary;;   KNEE ARTHROSCOPY Right 2010   KNEE ARTHROSCOPY WITH SUBCHONDROPLASTY Left 01/09/2020   Procedure: Left knee arthroscopy,partial medial meniscectomy, debridement, subchondroplasty left proximal tibia;  Surgeon: Susa Day, MD;  Location: [name];  Service: Orthopedics;  Laterality: Left;   LEFT HEART CATH AND CORONARY ANGIOGRAPHY N/A 02/19/2017   Procedure: LEFT HEART CATH AND CORONARY ANGIOGRAPHY;  Surgeon: [name], Peter M, MD;  Location: [place] CV LAB;  Service: Cardiovascular;  Laterality: N/A;   TONSILLECTOMY     TUBAL LIGATION     VIDEO BRONCHOSCOPY WITH ENDOBRONCHIAL ULTRASOUND N/A 01/15/2022  Procedure: VIDEO BRONCHOSCOPY WITH ENDOBRONCHIAL ULTRASOUND;  Surgeon: [name], MD;  Location: [facility] ENDOSCOPY;  Service: Pulmonary;  Laterality: N/A;   VIDEO BRONCHOSCOPY WITH RADIAL ENDOBRONCHIAL ULTRASOUND  01/15/2022   Procedure: RADIAL ENDOBRONCHIAL ULTRASOUND;  Surgeon: [name], MD;  Location: [facility] ENDOSCOPY;  Service: Pulmonary;;    Family History  Problem Relation Age of Onset   Diabetes Mother    Cancer Father        lung cancer   Heart attack Sister     Social History Social History   Tobacco Use    Smoking status: Some Days    Packs/day: 1.50    Years: 50.00    Total pack years: 75.00    Types: Cigarettes   Smokeless tobacco: Never   Tobacco comments:    Trying to quit, currently smokes 5-6 cigarettes per day 01/21/2022  Vaping Use   Vaping Use: Never used  Substance Use Topics   Alcohol use: Yes    Comment: occasional   Drug use: Never    Allergies  Allergen Reactions   Codeine Nausea Only    Current Outpatient Medications  Medication Sig Dispense Refill   acetaminophen (TYLENOL) 500 MG tablet Take 500 mg by mouth 2 (two) times daily as needed for moderate pain.     aspirin 81 MG chewable tablet Chew 1 tablet (81 mg total) by mouth daily.     atorvastatin (LIPITOR) 80 MG tablet TAKE 1 TABLET BY MOUTH DAILY AT 6 PM. 90 tablet 3   Coenzyme Q10 (CO Q 10) 100 MG CAPS Take 100-200 mg by mouth daily in the afternoon.     diclofenac Sodium (VOLTAREN) 1 % GEL Apply 2 g topically daily as needed (Knee pain).      ezetimibe (ZETIA) 10 MG tablet TAKE 1 TABLET BY MOUTH EVERY DAY (Patient taking differently: Takes at night) 90 tablet 3   irbesartan (AVAPRO) 300 MG tablet Take 1 tablet (300 mg total) by mouth daily. Patient need a appointment for future refills. 90 tablet 1   metoprolol tartrate (LOPRESSOR) 25 MG tablet TAKE 1 TABLET BY MOUTH TWICE A DAY 180 tablet 3   nitroGLYCERIN (NITROSTAT) 0.4 MG SL tablet Place 1 tablet (0.4 mg total) under the tongue every 5 (five) minutes x 3 doses as needed for chest pain. 25 tablet 2   Polyethyl Glycol-Propyl Glycol (SYSTANE FREE OP) Place 2 drops into both eyes daily as needed (Floaters).      traMADol (ULTRAM) 50 MG tablet Take 50 mg by mouth at bedtime.     trolamine salicylate (ASPERCREME) 10 % cream Apply 1 application topically daily as needed (Knee pain).     No current facility-administered medications for this visit.    Review of Systems  Constitutional: negative Eyes: negative Ears, nose, mouth, throat, and face:  negative Respiratory: negative Cardiovascular: negative Gastrointestinal: negative Genitourinary:negative Integument/breast: negative Hematologic/lymphatic: negative Musculoskeletal:negative Neurological: negative Behavioral/Psych: negative Endocrine: negative Allergic/Immunologic: negative  Physical Exam  ULA:GTXMI, healthy, no distress, well nourished, well developed, and anxious SKIN: skin color, texture, turgor are normal, no rashes or significant lesions HEAD: Normocephalic, No masses, lesions, tenderness or abnormalities EYES: normal, PERRLA, Conjunctiva are pink and non-injected EARS: External ears normal, Canals clear OROPHARYNX:no exudate, no erythema, and lips, buccal mucosa, and tongue normal  NECK: supple, no adenopathy, no JVD LYMPH:  no palpable lymphadenopathy, no hepatosplenomegaly BREAST:not examined LUNGS: clear to auscultation , and palpation HEART: regular rate & rhythm, no murmurs, and no gallops ABDOMEN:abdomen soft, non-tender, normal  bowel sounds, and no masses or organomegaly BACK: Back symmetric, no curvature., No CVA tenderness EXTREMITIES:no joint deformities, effusion, or inflammation, no edema  NEURO: alert & oriented x 3 with fluent speech, no focal motor/sensory deficits  PERFORMANCE STATUS: ECOG 1  LABORATORY DATA: Lab Results  Component Value Date   WBC 9.2 01/15/2022   HGB 15.1 (H) 01/15/2022   HCT 45.3 01/15/2022   MCV 92.1 01/15/2022   PLT 250 01/15/2022      Chemistry      Component Value Date/Time   NA 139 01/15/2022 0557   NA 143 12/08/2020 1620   K 4.3 01/15/2022 0557   CL 105 01/15/2022 0557   CO2 25 01/15/2022 0557   BUN 8 01/15/2022 0557   BUN 11 12/08/2020 1620   CREATININE 0.86 01/15/2022 0557      Component Value Date/Time   CALCIUM 9.1 01/15/2022 0557   ALKPHOS 100 10/26/2021 1730   AST 22 10/26/2021 1730   ALT 18 10/26/2021 1730   BILITOT 0.5 10/26/2021 1730   BILITOT 0.4 12/08/2020 1620        RADIOGRAPHIC STUDIES: MR Brain W Wo Contrast  Addendum Date: 02/08/2022   ADDENDUM REPORT: 02/08/2022 08:00 ADDENDUM: Upon further review, there is an additional punctate enhancing metastasis within the left temporo-occipital lobes (series 13, image 76). These results will be called to the ordering clinician or representative by the Radiologist Assistant, and communication documented in the PACS or [employer]. Electronically Signed   By: Kellie Simmering D.O.   On: 02/08/2022 08:00   Result Date: 02/08/2022 CLINICAL DATA:  Provided history: Brain/CNS neoplasm, staging. Metastatic lung to brain. Metastasis to brain. EXAM: MRI HEAD WITHOUT AND WITH CONTRAST TECHNIQUE: Multiplanar, multiecho pulse sequences of the brain and surrounding structures were obtained without and with intravenous contrast. CONTRAST:  9 mL Vueway intravenous contrast. COMPARISON:  Brain MRI 01/27/2022. FINDINGS: Brain: Mild generalized parenchymal atrophy. Multiple enhancing intracranial metastases. The following metastases are new or larger: Punctate enhancing lesion within the mid left frontal lobe, not definitively present on the prior MRI (series 13, image 112). Punctate enhancing lesion within the left lentiform nucleus, not definitively present on the prior MRI (series 13, image 97). 8 mm enhancing lesion within the left parietal lobe, increased in size (previously 6 mm) (series 13, image 109). 8 mm enhancing lesion within the right postcentral gyrus, slightly increased in size (previously measuring 6-7 mm) (series 13, image 121). Mild edema at this site, also slightly increased. 12 mm lesion within the medial left cerebellar hemisphere/cerebellar vermis, slightly increased in size (previously 11 mm). Mild edema at this site has also slightly increased. 4 mm lesion within the right cerebellar hemisphere, slightly increased in size (previously 3 mm) (series 13, image 41). The following lesions have remained stable in size: 4  mm lesion within the medial left precentral gyrus (series 13, image 142). 5 mm lesion within the mid-to-posterior left frontal lobe (series 13, image 132). 9 mm lesion within the mid-to-anterior left frontal lobe (series 13, image 119). 2 mm focus of enhancement within the anterolateral left frontal lobe (series 13, image 94). This finding was present on the prior MRI, and may reflect a metastasis or vascular enhancement. 3-4 mm lesion within the medial right parietal lobe (series 13, image 113). 13 mm lesion within the right occipital lobe (series 13, image 78). 4 mm lesion more inferiorly within the medial right occipital lobe (series 13, image 58). 4 mm lesion more laterally within the right occipital lobe (  series 13, image 59). 4 mm enhancing lesion within the left cerebellar hemisphere (series 13, image 33). Unchanged small choroidal fissure cyst on the right. There is no acute infarct. No chronic intracranial blood products. No extra-axial fluid collection. No midline shift. Vascular: Maintained flow voids within the proximal large arterial vessels. Skull and upper cervical spine: No focal suspicious marrow lesion. Incompletely assessed cervical spondylosis. Sinuses/Orbits: No mass or acute finding within the imaged orbits. No significant paranasal sinus disease. Other: Trace fluid within left mastoid air cells. IMPRESSION: 1. Multiple intracranial metastases, as detailed. 2. There are small lesions within the left frontal lobe and left lentiform nucleus which were not definitively present on the prior MRI of 01/27/2022. Additionally, there are enhancing lesions within the left parietal lobe, right postcentral gyrus, medial left cerebellar hemisphere/cerebellar vermis and right cerebellar hemisphere which have slightly increased in size. 3. A 2 mm focus of enhancement within the anterolateral left frontal lobe may reflect a metastasis or vascular enhancement. 4. The remaining intracranial metastases have  remained stable. Electronically Signed: By: Kellie Simmering D.O. On: 02/07/2022 11:54   MR BRAIN W WO CONTRAST  Result Date: 01/28/2022 CLINICAL DATA:  Non-small-cell lung cancer staging EXAM: MRI HEAD WITHOUT AND WITH CONTRAST TECHNIQUE: Multiplanar, multiecho pulse sequences of the brain and surrounding structures were obtained without and with intravenous contrast. CONTRAST:  8 mL Vueway IV COMPARISON:  None Available. FINDINGS: Brain: Multiple enhancing lesions the brain compatible with metastatic disease. No associated hemorrhage. Mild edema without mass-effect or midline shift. 3 mm lesion right inferior cerebellum axial image 35 3 mm lesion left inferior cerebellum axial image 29 11 mm enhancing lesion left posterior cerebellum axial image 39 3 mm lesion right occipital lobe axial image 59 3 mm peripheral enhancing lesion right lateral occipital lobe axial image 59 13 mm enhancing lesion right occipital lobe axial image 70 2 mm lesion right medial parietal lobe axial image 106 5 mm enhancing lesion right postcentral cortex axial image 114 6 mm enhancing lesion left parietal lobe axial image 104 8 mm lesion left frontal lobe axial image 106 4 mm lesion left frontal lobe axial image 120 4 mm lesion left medial precentral cortex axial image 135 Negative for acute infarct. Vascular: Normal arterial flow voids at the skull base. Skull and upper cervical spine: No skeletal metastasis. Sinuses/Orbits: Paranasal sinuses clear.  Negative orbit Other: None IMPRESSION: Widespread metastatic disease to the brain. Most lesions are small without significant edema or midline shift. No associated hemorrhage. Electronically Signed   By: Franchot Gallo M.D.   On: 01/28/2022 14:10   DG Chest Port 1 View  Result Date: 01/15/2022 CLINICAL DATA:  11572 status post LEFT-sided lung biopsy EXAM: PORTABLE CHEST 1 VIEW COMPARISON:  CT dated January 08, 2022 FINDINGS: The cardiomediastinal silhouette is unchanged in  contour.Atherosclerotic calcifications. No pleural effusion. No significant pneumothorax. LEFT-sided pulmonary nodule better assessed on prior CT. Mildly increased adjacent airspace haziness, likely post biopsy changes. Visualized abdomen is unremarkable. IMPRESSION: No significant pneumothorax status post pulmonary nodule biopsy. Mildly increased airspace opacification adjacent to the nodule, likely reflecting post biopsy changes. Electronically Signed   By: Valentino Saxon M.D.   On: 01/15/2022 10:02   DG C-ARM BRONCHOSCOPY  Result Date: 01/15/2022 C-ARM BRONCHOSCOPY: Fluoroscopy was utilized by the requesting physician.  No radiographic interpretation.    ASSESSMENT: This is a very pleasant 71 years old white female recently diagnosed with stage IV (T1c, N2, M1 C) non-small cell lung cancer, adenocarcinoma with positive  KRAS G12C mutation diagnosed in September 2023 and presented with left upper lobe lung mass in addition to left hilar and mediastinal lymphadenopathy as well as innumerable brain metastasis.   PLAN: I had a lengthy discussion with the patient today about her current disease stage, prognosis and treatment options. I personally and independently reviewed the scan images and discussed the result and showed the images to the patient today. The patient is seen by Dr. Sondra Come and she is scheduled to see Dr. [name] in few days for discussion of the best treatment options for her brain metastasis including enrollment in a trial with the treatment option of whole brain irradiation versus stereotactic radiotherapy. I also discussed with the patient her systemic treatment options and with the multiple brain metastasis, I gave the patient the option of palliative care and hospice versus consideration of palliative systemic chemotherapy with carboplatin for AUC of 5, Alimta 500 Mg/M2 and Keytruda 200 Mg IV every 3 weeks.  If the patient has disease progression on the frontline therapy, she would  be considered for treatment with targeted therapy with target treatment like Krazati (Adagrasib) or Lumakras (Sotorasib) for the positive KRAS G12C mutation. I discussed with the patient the adverse effect of the chemotherapy including but not limited to alopecia, myelosuppression, nausea and vomiting, peripheral neuropathy, liver or renal dysfunction as well as the adverse effect of immunotherapy. I will see the patient back for follow-up visit in around 2 weeks for more detailed discussion of this treatment options after treatment of her brain radiation. The patient was strongly advised to quit smoking. She was advised to call immediately if she has any other concerning symptoms in the interval. The patient voices understanding of current disease status and treatment options and is in agreement with the current care plan.  All questions were answered. The patient knows to call the clinic with any problems, questions or concerns. We can certainly see the patient much sooner if necessary.  Thank you so much for allowing me to participate in the care of KAI RAILSBACK. I will continue to follow up the patient with you and assist in her care.  The total time spent in the appointment was 90 minutes.  Disclaimer: This note was dictated with voice recognition software. Similar sounding words can inadvertently be transcribed and may not be corrected upon review.   Kimberly Huang February 08, 2022, 8:51 AM

## 2022-02-09 ENCOUNTER — Encounter: Payer: Medicare Other | Admitting: Thoracic Surgery (Cardiothoracic Vascular Surgery)

## 2022-02-09 ENCOUNTER — Institutional Professional Consult (permissible substitution): Payer: Self-pay | Admitting: Radiation Oncology

## 2022-02-09 NOTE — Progress Notes (Signed)
Location/Histology of Brain Tumor:  3T MRI  02/05/2022 --IMPRESSION: Multiple intracranial metastases, as detailed. There are small lesions within the left frontal lobe and left lentiform nucleus which were not definitively present on the prior MRI of 01/27/2022. Additionally, there are enhancing lesions within the left parietal lobe, right postcentral gyrus, medial left cerebellar hemisphere/cerebellar vermis and right cerebellar hemisphere which have slightly increased in size. A 2 mm focus of enhancement within the anterolateral left frontal lobe may reflect a metastasis or vascular enhancement. The remaining intracranial metastases have remained stable ADDENDUM: Upon further review, there is an additional punctate enhancing metastasis within the left temporo-occipital lobes (series 13, image 76).  Past or anticipated interventions, if any, per neurosurgery: TBD based on which clinical trial patient is enrolled in  Past or anticipated interventions, if any, per medical oncology:  Under care of Dr. Curt Bears  02/08/2022 I had a lengthy discussion with the patient today about her current disease stage, prognosis and treatment options. I personally and independently reviewed the scan images and discussed the result and showed the images to the patient today. The patient is seen by Dr. Sondra Come and she is scheduled to see Dr. [name] in few days for discussion of the best treatment options for her brain metastasis including enrollment in a trial with the treatment option of whole brain irradiation versus stereotactic radiotherapy. I also discussed with the patient her systemic treatment options and with the multiple brain metastasis, I gave the patient the option of palliative care and hospice versus consideration of palliative systemic chemotherapy with carboplatin for AUC of 5, Alimta 500 Mg/M2 and Keytruda 200 Mg IV every 3 weeks.   If the patient has disease progression on the frontline  therapy, she would be considered for treatment with targeted therapy with target treatment like Krazati (Adagrasib) or Lumakras (Sotorasib) for the positive KRAS G12C mutation. I will see the patient back for follow-up visit in around 2 weeks for more detailed discussion of this treatment options after treatment of her brain radiation. The patient was strongly advised to quit smoking. She was advised to call immediately if she has any other concerning symptoms in the interval. The patient voices understanding of current disease status and treatment options and is in agreement with the current care plan.  Dose of Decadron, if applicable: Not currently prescribed  Recent neurologic symptoms, if any:  Seizures: no Headaches: no Nausea: reports having some in the mornings Dizziness/ataxia: no Difficulty with hand coordination: no Focal numbness/weakness: no Visual deficits/changes: no Confusion/Memory deficits: no  SAFETY ISSUES: Prior radiation? No (is scheduled to start radiation to left upper lobe with Dr. Sondra Come) Pacemaker/ICD? No Possible current pregnancy? No--hysterectomy Is the patient on methotrexate? No  Additional Complaints / other details: Patient is her with her daughter in law.  BP (!) 142/71 (BP Location: Left Arm, Patient Position: Sitting, Cuff Size: Large)   Pulse 70   Temp (!) 96.8 F (36 C)   Resp 20   Ht '5\' 3"'  (1.6 m)   Wt 193 lb 6.4 oz (87.7 kg)   SpO2 99%   BMI 34.26 kg/m

## 2022-02-09 NOTE — Progress Notes (Signed)
Radiation Oncology         [phone] ________________________________  Initial Outpatient Consultation  Name: Kimberly Huang MRN: [id number]  Date: 02/10/2022  DOB: 1950-06-12  ZC:HYIFOYD, No Pcp Per  [name], MD   REFERRING PHYSICIAN: [name], MD  DIAGNOSIS:  No diagnosis found.    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 female who presents today for discussion of possible SRS to the brain / whole brain hippocampal sparing. The patient was recently seen in consultation by Dr. Sondra Come on 01/21/22 for consideration of radiation therapy for her recently diagnosed LUL adenocarcinoma. The patient is agreeable to proceed with radiation to the left lung. Dr. Sondra Come informed her that her final treatment plan would depend on the results of her upcoming brain MRI at the time to rule out metastatic disease, and further input from Dr. Roxan Hockey to see if she qualifies for surgery. Her disease history is detailed as follows.   Lung cancer:  The patient presented to the ED on 10/26/21 with the cc of sharp left sided rib pain 2 weeks after a fall. She detailed the pain as exacerbated by taking deep breaths and laying on her left side. CXR performed showed a new nodular focal opacity in the left upper lobe measuring 2.7 cm. No other abnormalities were seen in either lung including pleural effusion or pneumothorax. (CT not performed in the ED given that the patient had to leave to pick up her husband).    Accordingly, the patient was referred to [facility] pulmonology, Dr. Loanne Drilling, on 11/17/21 for further evaluation and management. During this visit, the patient denied any other symptoms (both respiratory and otherwise) asides from left rib pain, and reported smoking 1.5 ppd for 50 years.    Chest CT ordered by Dr. Loanne Drilling performed on 11/24/21 showed a 3.3 x 2.1 cm left upper  lobe pulmonary mass with associated left hilar and mediastinal lymphadenopathy concerning for malignancy. Ct also showed a noncalcified 0.4 cm LUL nodule, several calcified pulmonary micronodules within the LUL and RUL, bilateral indeterminate adrenal nodules measuring 2.8 x 1.7 on the left and 2.1 x 1.3 cm on the right (possibly representing adrenal lesions vs metastases), and partially visualized fluid density lesion and calcifications within the left kidney.    Prior to undergoing bronchoscopy, Dr. Loanne Drilling recommended proceeding with PET and PFT's first.    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 Understandably, this delayed her work-up.    PET scan on 01/08/22 showed: the left upper lobe lung mass as FDG avid and abutting the oblique fissure and left lateral pleural surface. The mass was appreciated to measure 2.9 cm with an SUV max of 11.45 concerning for malignancy. PET also showed tracer avid left hilar and left mediastinal lymph nodes compatible with nodal metastasis, and incidental left seventh, eighth, and ninth rib fractures with corresponding posttraumatic uptake. Otherwise, PET showed no signs of tracer avid distant metastatic disease.     Super D chest CT also performed on 01/08/22 showed a mild increase in size of FDG avid left upper lobe lung mass abutting the left lateral pleural surface and oblique fissure (as detailed on PET above). CT also re-demonstrated the small nonspecific pulmonary nodules within both lungs measuring less than 5 mm (unchanged from 11/24/2021), and the bilateral adrenal gland adenomas.   The  patient opted to proceed with bronchoscopy and biopsies on 01/15/22 under the care of Dr. Verlee Monte. FNA and lavage of the LUL revealed malignant cells consistent with adenocarcinoma. FNA of lymph nodes 11L showed rare atypical large cells suspicious for tumor. FNA of lymph node 4L showed no evidence of  malignancy and acute inflammatory cells.   The patient's case was presented at the tumor board held on 01/21/22.   Brain metastasis:    MRI of the brain on 01/27/22 demonstrated widespread metastatic disease to the brain. Most of the lesions appear to be small, without significant edema or midline shift, and without associated hemorrhage.  The largest lesion appreciated is located in the right occipital lobe, measuring 13 mm.   Repeat MRI of the brain on 02/05/22 further revealed small lesions within the left frontal lobe and left lentiform nucleus which were not definitively present on the prior MRI from 01/27/2022. MRI also showed a slight interval increase in size of enhancing lesions within the left parietal lobe, right postcentral gyrus, medial left cerebellar hemisphere/cerebellar vermis, and right cerebellar Hemisphere. A 2 mm focus of enhancement within the anterolateral left frontal lobe was also appreciated, and may reflect a metastasis or vascular enhancement. The other intracranial metastases otherwise remained stable in the interval.   The patient recently met with Dr. [name] on 02/07/22 to discuss systemic treatment options. Options discussed included palliative care and hospice versus consideration of palliative systemic chemotherapy with carboplatin, Alimta, and Keytruda every 3 weeks.  If the patient is found to have disease progression with frontline therapy, Dr. [name] discussed the possibility of treatment with targeted therapy such as Krazati (Adagrasib) or Lumakras (Sotorasib) for the positive KRAS G12C mutation. The patient will return to Dr. [name] in about 2 week for further discussion of systemic treatment options after she completes radiation to the brain.  PREVIOUS RADIATION THERAPY: No  PAST MEDICAL HISTORY:  has a past medical history of Arthritis, CAD S/P PCI OM1 99%-0%; Plan Staged PCI mRCA 90%. (02/19/2017), Essential hypertension (02/21/2017), Hyperlipidemia with  target LDL less than 70 (02/21/2017), Presence of drug-eluting stent in L Cx: OM1 (Xience SIERRA DES 3.5 x 15) (02/21/2017), STEMI involving left circumflex coronary artery ([place]) (02/19/2017), and Tobacco abuse (02/19/2017).    PAST SURGICAL HISTORY: Past Surgical History:  Procedure Laterality Date   BRONCHIAL BIOPSY  01/15/2022   Procedure: BRONCHIAL BIOPSIES;  Surgeon: [name], MD;  Location: [facility] ENDOSCOPY;  Service: Pulmonary;;   BRONCHIAL BRUSHINGS  01/15/2022   Procedure: BRONCHIAL BRUSHINGS;  Surgeon: [name], MD;  Location: [facility] ENDOSCOPY;  Service: Pulmonary;;   BRONCHIAL NEEDLE ASPIRATION BIOPSY  01/15/2022   Procedure: BRONCHIAL NEEDLE ASPIRATION BIOPSIES;  Surgeon: [name], MD;  Location: [facility] ENDOSCOPY;  Service: Pulmonary;;   CARDIAC CATHETERIZATION  02/21/2017   CHOLECYSTECTOMY     CORONARY STENT INTERVENTION N/A 02/19/2017   Procedure: CORONARY STENT INTERVENTION;  Surgeon: [name], Peter M, MD;  Location: [place] CV LAB;  Service: Cardiovascular;  Laterality: N/A;   CORONARY STENT INTERVENTION N/A 02/21/2017   Procedure: CORONARY STENT INTERVENTION;  Surgeon: Belva Crome, MD;  Location: [place] CV LAB;  Service: Cardiovascular;  Laterality: N/A;   FIDUCIAL MARKER PLACEMENT  01/15/2022   Procedure: FIDUCIAL MARKER PLACEMENT;  Surgeon: [name], MD;  Location: [place];  Service: Pulmonary;;   FINE NEEDLE ASPIRATION  01/15/2022   Procedure: FINE NEEDLE ASPIRATION;  Surgeon: [name], MD;  [facility] ENDOSCOPY;  Service: Pulmonary;;   HEMOSTASIS  CONTROL  01/15/2022   Procedure: HEMOSTASIS CONTROL;  Surgeon: [name], MD;  Location: [facility] ENDOSCOPY;  Service: Pulmonary;;   KNEE ARTHROSCOPY Right 2010   KNEE ARTHROSCOPY WITH SUBCHONDROPLASTY Left 01/09/2020   Procedure: Left knee arthroscopy,partial medial meniscectomy, debridement, subchondroplasty left proximal tibia;  Surgeon: Susa Day, MD;  Location: [facility]  ORS;  Service: Orthopedics;  Laterality: Left;   LEFT HEART CATH AND CORONARY ANGIOGRAPHY N/A 02/19/2017   Procedure: LEFT HEART CATH AND CORONARY ANGIOGRAPHY;  Surgeon: [name], Peter M, MD;  Location: [place] CV LAB;  Service: Cardiovascular;  Laterality: N/A;   TONSILLECTOMY     TUBAL LIGATION     VIDEO BRONCHOSCOPY WITH ENDOBRONCHIAL ULTRASOUND N/A 01/15/2022   Procedure: VIDEO BRONCHOSCOPY WITH ENDOBRONCHIAL ULTRASOUND;  Surgeon: [name], MD;  Location[facility] ENDOSCOPY;  Service: Pulmonary;  Laterality: N/A;   VIDEO BRONCHOSCOPY WITH RADIAL ENDOBRONCHIAL ULTRASOUND  01/15/2022   Procedure: RADIAL ENDOBRONCHIAL ULTRASOUND;  Surgeon: [name], MD;  Location: [facility] ENDOSCOPY;  Service: Pulmonary;;    FAMILY HISTORY: family history includes Cancer in her father; Diabetes in her mother; Heart attack in her sister.  SOCIAL HISTORY:  reports that she has been smoking cigarettes. She has a 75.00 pack-year smoking history. She has never used smokeless tobacco. She reports current alcohol use. She reports that she does not use drugs.  ALLERGIES: Codeine  MEDICATIONS:  Current Outpatient Medications  Medication Sig Dispense Refill   acetaminophen (TYLENOL) 500 MG tablet Take 500 mg by mouth 2 (two) times daily as needed for moderate pain.     aspirin 81 MG chewable tablet Chew 1 tablet (81 mg total) by mouth daily.     atorvastatin (LIPITOR) 80 MG tablet TAKE 1 TABLET BY MOUTH DAILY AT 6 PM. 90 tablet 3   Coenzyme Q10 (CO Q 10) 100 MG CAPS Take 100-200 mg by mouth daily in the afternoon.     diclofenac Sodium (VOLTAREN) 1 % GEL Apply 2 g topically daily as needed (Knee pain).      ezetimibe (ZETIA) 10 MG tablet TAKE 1 TABLET BY MOUTH EVERY DAY (Patient taking differently: Takes at night) 90 tablet 3   irbesartan (AVAPRO) 300 MG tablet Take 1 tablet (300 mg total) by mouth daily. Patient need a appointment for future refills. 90 tablet 1   metoprolol tartrate (LOPRESSOR) 25 MG  tablet TAKE 1 TABLET BY MOUTH TWICE A DAY 180 tablet 3   nitroGLYCERIN (NITROSTAT) 0.4 MG SL tablet Place 1 tablet (0.4 mg total) under the tongue every 5 (five) minutes x 3 doses as needed for chest pain. 25 tablet 2   Polyethyl Glycol-Propyl Glycol (SYSTANE FREE OP) Place 2 drops into both eyes daily as needed (Floaters).      traMADol (ULTRAM) 50 MG tablet Take 50 mg by mouth at bedtime.     trolamine salicylate (ASPERCREME) 10 % cream Apply 1 application topically daily as needed (Knee pain).     No current facility-administered medications for this encounter.    REVIEW OF SYSTEMS:  As above.   PHYSICAL EXAM:  vitals were not taken for this visit.   General: Alert and oriented, in no acute distress *** HEENT: Head is normocephalic. Extraocular movements are intact. Oropharynx is clear. Neck: Neck is supple, no palpable cervical or supraclavicular lymphadenopathy. Heart: Regular in rate and rhythm with no murmurs, rubs, or gallops. Chest: Clear to auscultation bilaterally, with no rhonchi, wheezes, or rales. Abdomen: Soft, nontender, nondistended, with no rigidity or guarding. Extremities: No  cyanosis or edema. Lymphatics: see Neck Exam Skin: No concerning lesions. Musculoskeletal: symmetric strength and muscle tone throughout. Neurologic: Cranial nerves II through XII are grossly intact. No obvious focalities. Speech is fluent. Coordination is intact. Psychiatric: Judgment and insight are intact. Affect is appropriate.   LABORATORY DATA:  Lab Results  Component Value Date   WBC 8.1 02/08/2022   HGB 14.5 02/08/2022   HCT 42.9 02/08/2022   MCV 89.9 02/08/2022   PLT 223 02/08/2022   CMP     Component Value Date/Time   NA 140 02/08/2022 1149   NA 143 12/08/2020 1620   K 4.0 02/08/2022 1149   CL 106 02/08/2022 1149   CO2 29 02/08/2022 1149   GLUCOSE 211 (H) 02/08/2022 1149   BUN 9 02/08/2022 1149   BUN 11 12/08/2020 1620   CREATININE 0.89 02/08/2022 1149   CALCIUM 9.1  02/08/2022 1149   PROT 7.3 02/08/2022 1149   PROT 7.2 12/08/2020 1620   ALBUMIN 4.1 02/08/2022 1149   ALBUMIN 4.4 12/08/2020 1620   AST 17 02/08/2022 1149   ALT 16 02/08/2022 1149   ALKPHOS 97 02/08/2022 1149   BILITOT 0.4 02/08/2022 1149   GFRNONAA >60 02/08/2022 1149   GFRAA 102 03/13/2020 1204         RADIOGRAPHY: MR Brain W Wo Contrast  Addendum Date: 02/08/2022   ADDENDUM REPORT: 02/08/2022 08:00 ADDENDUM: Upon further review, there is an additional punctate enhancing metastasis within the left temporo-occipital lobes (series 13, image 76). These results will be called to the ordering clinician or representative by the Radiologist Assistant, and communication documented in the PACS or [employer]. Electronically Signed   By: Kellie Simmering D.O.   On: 02/08/2022 08:00   Result Date: 02/08/2022 CLINICAL DATA:  Provided history: Brain/CNS neoplasm, staging. Metastatic lung to brain. Metastasis to brain. EXAM: MRI HEAD WITHOUT AND WITH CONTRAST TECHNIQUE: Multiplanar, multiecho pulse sequences of the brain and surrounding structures were obtained without and with intravenous contrast. CONTRAST:  9 mL Vueway intravenous contrast. COMPARISON:  Brain MRI 01/27/2022. FINDINGS: Brain: Mild generalized parenchymal atrophy. Multiple enhancing intracranial metastases. The following metastases are new or larger: Punctate enhancing lesion within the mid left frontal lobe, not definitively present on the prior MRI (series 13, image 112). Punctate enhancing lesion within the left lentiform nucleus, not definitively present on the prior MRI (series 13, image 97). 8 mm enhancing lesion within the left parietal lobe, increased in size (previously 6 mm) (series 13, image 109). 8 mm enhancing lesion within the right postcentral gyrus, slightly increased in size (previously measuring 6-7 mm) (series 13, image 121). Mild edema at this site, also slightly increased. 12 mm lesion within the medial left cerebellar  hemisphere/cerebellar vermis, slightly increased in size (previously 11 mm). Mild edema at this site has also slightly increased. 4 mm lesion within the right cerebellar hemisphere, slightly increased in size (previously 3 mm) (series 13, image 41). The following lesions have remained stable in size: 4 mm lesion within the medial left precentral gyrus (series 13, image 142). 5 mm lesion within the mid-to-posterior left frontal lobe (series 13, image 132). 9 mm lesion within the mid-to-anterior left frontal lobe (series 13, image 119). 2 mm focus of enhancement within the anterolateral left frontal lobe (series 13, image 94). This finding was present on the prior MRI, and may reflect a metastasis or vascular enhancement. 3-4 mm lesion within the medial right parietal lobe (series 13, image 113). 13 mm lesion within the right occipital  lobe (series 13, image 78). 4 mm lesion more inferiorly within the medial right occipital lobe (series 13, image 58). 4 mm lesion more laterally within the right occipital lobe (series 13, image 59). 4 mm enhancing lesion within the left cerebellar hemisphere (series 13, image 33). Unchanged small choroidal fissure cyst on the right. There is no acute infarct. No chronic intracranial blood products. No extra-axial fluid collection. No midline shift. Vascular: Maintained flow voids within the proximal large arterial vessels. Skull and upper cervical spine: No focal suspicious marrow lesion. Incompletely assessed cervical spondylosis. Sinuses/Orbits: No mass or acute finding within the imaged orbits. No significant paranasal sinus disease. Other: Trace fluid within left mastoid air cells. IMPRESSION: 1. Multiple intracranial metastases, as detailed. 2. There are small lesions within the left frontal lobe and left lentiform nucleus which were not definitively present on the prior MRI of 01/27/2022. Additionally, there are enhancing lesions within the left parietal lobe, right postcentral  gyrus, medial left cerebellar hemisphere/cerebellar vermis and right cerebellar hemisphere which have slightly increased in size. 3. A 2 mm focus of enhancement within the anterolateral left frontal lobe may reflect a metastasis or vascular enhancement. 4. The remaining intracranial metastases have remained stable. Electronically Signed: By: Kellie Simmering D.O. On: 02/07/2022 11:54   MR BRAIN W WO CONTRAST  Result Date: 01/28/2022 CLINICAL DATA:  Non-small-cell lung cancer staging EXAM: MRI HEAD WITHOUT AND WITH CONTRAST TECHNIQUE: Multiplanar, multiecho pulse sequences of the brain and surrounding structures were obtained without and with intravenous contrast. CONTRAST:  8 mL Vueway IV COMPARISON:  None Available. FINDINGS: Brain: Multiple enhancing lesions the brain compatible with metastatic disease. No associated hemorrhage. Mild edema without mass-effect or midline shift. 3 mm lesion right inferior cerebellum axial image 35 3 mm lesion left inferior cerebellum axial image 29 11 mm enhancing lesion left posterior cerebellum axial image 39 3 mm lesion right occipital lobe axial image 59 3 mm peripheral enhancing lesion right lateral occipital lobe axial image 59 13 mm enhancing lesion right occipital lobe axial image 70 2 mm lesion right medial parietal lobe axial image 106 5 mm enhancing lesion right postcentral cortex axial image 114 6 mm enhancing lesion left parietal lobe axial image 104 8 mm lesion left frontal lobe axial image 106 4 mm lesion left frontal lobe axial image 120 4 mm lesion left medial precentral cortex axial image 135 Negative for acute infarct. Vascular: Normal arterial flow voids at the skull base. Skull and upper cervical spine: No skeletal metastasis. Sinuses/Orbits: Paranasal sinuses clear.  Negative orbit Other: None IMPRESSION: Widespread metastatic disease to the brain. Most lesions are small without significant edema or midline shift. No associated hemorrhage. Electronically Signed    By: Franchot Gallo M.D.   On: 01/28/2022 14:10   DG Chest Port 1 View  Result Date: 01/15/2022 CLINICAL DATA:  [id number] status post LEFT-sided lung biopsy EXAM: PORTABLE CHEST 1 VIEW COMPARISON:  CT dated January 08, 2022 FINDINGS: The cardiomediastinal silhouette is unchanged in contour.Atherosclerotic calcifications. No pleural effusion. No significant pneumothorax. LEFT-sided pulmonary nodule better assessed on prior CT. Mildly increased adjacent airspace haziness, likely post biopsy changes. Visualized abdomen is unremarkable. IMPRESSION: No significant pneumothorax status post pulmonary nodule biopsy. Mildly increased airspace opacification adjacent to the nodule, likely reflecting post biopsy changes. Electronically Signed   By: Valentino Saxon M.D.   On: 01/15/2022 10:02   DG C-ARM BRONCHOSCOPY  Result Date: 01/15/2022 C-ARM BRONCHOSCOPY: Fluoroscopy was utilized by the requesting physician.  No radiographic interpretation.  IMPRESSION/PLAN: This is a very pleasant *** year old *** with metastatic disease to the brain.  I had a lengthy discussion with the patient after reviewing their MRI results with them.  We spoke about whole brain radiotherapy versus stereotactic radiosurgery to the brain. We spoke about the differing risks benefits and side effects of both of these treatments. During part of our discussion, we spoke about the hair loss, fatigue and cognitive effects that can result from whole brain radiotherapy.  Additionally, we spoke about radionecrosis that can result from stereotactic radiosurgery. I explained that whole brain radiotherapy is more comprehensive and therefore can decrease the chance of recurrences elsewhere in the brain, while stereotactic radiosurgery only treats the areas of gross disease while sparing the rest of the brain parenchyma.  After lengthy discussion, the patient would like to proceed with stereotactic brain radiosurgery to their metastatic disease.  They will meet with neurosurgery in the near future to discuss this further; a neurosurgeon will participate in their case.  CT simulation will take place on *** and treatment on ***.  I plan to deliver *** Gy in 1 fraction to ***.  On date of service, in total, I spent *** minutes on this encounter. Patient was seen in person.   __________________________________________   Eppie Gibson, MD  This document serves as a record of services personally performed by Eppie Gibson, MD. It was created on her behalf by Roney Mans, a trained medical scribe. The creation of this record is based on the scribe's personal observations and the provider's statements to them. This document has been checked and approved by the attending provider.

## 2022-02-10 ENCOUNTER — Ambulatory Visit
Admission: RE | Admit: 2022-02-10 | Discharge: 2022-02-10 | Disposition: A | Discharge status: Home / Self Care | Payer: Medicare Other | Source: Ambulatory Visit | Attending: Radiation Oncology | Admitting: Radiation Oncology

## 2022-02-10 ENCOUNTER — Encounter: Payer: Self-pay | Admitting: Oncology

## 2022-02-10 ENCOUNTER — Telehealth: Payer: Self-pay | Admitting: Oncology

## 2022-02-10 ENCOUNTER — Encounter (HOSPITAL_COMMUNITY): Payer: Self-pay

## 2022-02-10 VITALS — BP 142/71 | HR 70 | Temp 96.8°F | Resp 20 | Ht 63.0 in | Wt 193.4 lb

## 2022-02-10 DIAGNOSIS — M47812 Spondylosis without myelopathy or radiculopathy, cervical region: Secondary | ICD-10-CM | POA: Diagnosis not present

## 2022-02-10 DIAGNOSIS — Z7982 Long term (current) use of aspirin: Secondary | ICD-10-CM | POA: Insufficient documentation

## 2022-02-10 DIAGNOSIS — F17210 Nicotine dependence, cigarettes, uncomplicated: Secondary | ICD-10-CM | POA: Insufficient documentation

## 2022-02-10 DIAGNOSIS — I252 Old myocardial infarction: Secondary | ICD-10-CM | POA: Diagnosis not present

## 2022-02-10 DIAGNOSIS — C3412 Malignant neoplasm of upper lobe, left bronchus or lung: Secondary | ICD-10-CM | POA: Insufficient documentation

## 2022-02-10 DIAGNOSIS — C7931 Secondary malignant neoplasm of brain: Secondary | ICD-10-CM

## 2022-02-10 DIAGNOSIS — C3490 Malignant neoplasm of unspecified part of unspecified bronchus or lung: Secondary | ICD-10-CM

## 2022-02-10 DIAGNOSIS — E785 Hyperlipidemia, unspecified: Secondary | ICD-10-CM | POA: Insufficient documentation

## 2022-02-10 DIAGNOSIS — I2510 Atherosclerotic heart disease of native coronary artery without angina pectoris: Secondary | ICD-10-CM | POA: Insufficient documentation

## 2022-02-10 DIAGNOSIS — C3492 Malignant neoplasm of unspecified part of left bronchus or lung: Secondary | ICD-10-CM

## 2022-02-10 DIAGNOSIS — Z79899 Other long term (current) drug therapy: Secondary | ICD-10-CM | POA: Diagnosis not present

## 2022-02-10 HISTORY — DX: Malignant neoplasm of unspecified part of unspecified bronchus or lung: C34.90

## 2022-02-10 MED ORDER — NICOTINE 14 MG/24HR TD PT24: 14.0000 mg | MEDICATED_PATCH | Freq: Every day | TRANSDERMAL | 2 refills | Status: DC

## 2022-02-10 MED ORDER — NICOTINE 7 MG/24HR TD PT24: 7.0000 mg | MEDICATED_PATCH | Freq: Every day | TRANSDERMAL | 0 refills | Status: DC

## 2022-02-10 NOTE — Telephone Encounter (Signed)
Called Kimberly Huang and let her know that Dr. [name] has sent in the prescription for the nicotine patches but was not able to send in the bupropion prescription due to potential drug interactions with her metoprolol. She verbalized understanding and agreement.

## 2022-02-10 NOTE — Research (Signed)
A PHASE III TRIAL OF STEREOTACTIC RADIOSURGERY COMPARED WITH HIPPOCAMPAL-AVOIDANT WHOLE BRAIN RADIOTHERAPY (HA-WBRT) PLUS MEMANTINE FOR 5-15 BRAIN METASTASES  Patient Kimberly Huang was identified by Dr [name] as a potential candidate for the above listed study. This Clinical Research Nurse met with [name], [name], on 02/10/22 in a manner and location that ensures patient privacy to discuss participation in the above listed research study. Patient is Accompanied by her daughter-in-law . A copy of the informed consent document and separate HIPAA Authorization was provided to the patient. Patient reads, speaks, and understands [place].    Patient was provided with the business card of this Nurse and encouraged to contact the research team with any questions. Approximately 20 minutes were spent with the patient reviewing the informed consent documents. Patient was provided the option of taking informed consent documents home to review and was encouraged to review at their convenience with their support network, including other care providers. Patient took the consent documents home to review.  Patient agreed to follow-up call with research on Friday, 10/13, to confirm interest and schedule appointments for participation.  Vickii Penna, RN, BSN, CPN Clinical Research Nurse I [phone]  02/10/2022 10:42 AM

## 2022-02-12 ENCOUNTER — Telehealth: Payer: Self-pay

## 2022-02-12 NOTE — Telephone Encounter (Signed)
A PHASE III TRIAL OF STEREOTACTIC RADIOSURGERY COMPARED WITH HIPPOCAMPAL-AVOIDANT WHOLE BRAIN RADIOTHERAPY (HA-WBRT) PLUS MEMANTINE FOR 5-15 BRAIN METASTASES  Patient returned call to research. Patient stated that, after reviewing the consents and talking with her family, she'd prefer one arm of the study [facility]) over randomization and thus declined to participate. Will alert treating investigator. Patient thanked for her time and consideration and encouraged to call with any further questions or concerns.  Will approach for DCP-001 at an upcoming appointment.  Vickii Penna, RN, BSN, CPN Clinical Research Nurse I [phone]  02/12/2022 3:03 PM

## 2022-02-12 NOTE — Telephone Encounter (Signed)
A PHASE III TRIAL OF STEREOTACTIC RADIOSURGERY COMPARED WITH HIPPOCAMPAL-AVOIDANT WHOLE BRAIN RADIOTHERAPY (HA-WBRT) PLUS MEMANTINE FOR 5-15 BRAIN METASTASES  Called and LVM for patient to follow-up on interest in above-listed study. Provided call-back information; will continue to follow-up as necessary.  Vickii Penna, RN, BSN, CPN Clinical Research Nurse I [phone]  02/12/2022 10:40 AM

## 2022-02-16 ENCOUNTER — Ambulatory Visit: Payer: Medicare Other | Admitting: Radiation Oncology

## 2022-02-16 ENCOUNTER — Ambulatory Visit: Payer: Medicare Other

## 2022-02-17 ENCOUNTER — Ambulatory Visit
Admission: RE | Admit: 2022-02-17 | Discharge: 2022-02-17 | Disposition: A | Discharge status: Home / Self Care | Payer: Medicare Other | Source: Ambulatory Visit | Attending: Radiation Oncology | Admitting: Radiation Oncology

## 2022-02-17 ENCOUNTER — Ambulatory Visit: Payer: Medicare Other | Admitting: Radiation Oncology

## 2022-02-17 ENCOUNTER — Other Ambulatory Visit: Payer: Self-pay

## 2022-02-17 ENCOUNTER — Ambulatory Visit: Payer: Medicare Other

## 2022-02-17 VITALS — BP 137/83 | HR 65 | Resp 18

## 2022-02-17 DIAGNOSIS — Z5111 Encounter for antineoplastic chemotherapy: Secondary | ICD-10-CM | POA: Diagnosis not present

## 2022-02-17 DIAGNOSIS — C7931 Secondary malignant neoplasm of brain: Secondary | ICD-10-CM

## 2022-02-17 DIAGNOSIS — C3492 Malignant neoplasm of unspecified part of left bronchus or lung: Secondary | ICD-10-CM | POA: Insufficient documentation

## 2022-02-17 MED ORDER — SODIUM CHLORIDE 0.9% FLUSH
10.0000 mL | INTRAVENOUS | Status: DC | PRN
  Administered 2022-02-17: 10 mL via INTRAVENOUS

## 2022-02-17 NOTE — Progress Notes (Signed)
Has armband been applied?  Yes.    Does patient have an allergy to IV contrast dye?: No.   Has patient ever received premedication for IV contrast dye?: No.   Date of lab work: 02/08/2022 BUN: 9 CR: 0.89 eGFR: >60  Does patient take metformin?: no  Is eGFR >60?:  If no, when can patient resume? (Must be 48 hrs AFTER they receive IV contrast):    IV site: rt anticubtial  Has IV site been added to flowsheet?  Yes.    There were no vitals taken for this visit. Vitals:   02/17/22 0845  BP: 137/83  Pulse: 65  Resp: 18  SpO2: 98%

## 2022-02-22 ENCOUNTER — Inpatient Hospital Stay: Payer: Medicare Other

## 2022-02-22 ENCOUNTER — Inpatient Hospital Stay (HOSPITAL_BASED_OUTPATIENT_CLINIC_OR_DEPARTMENT_OTHER): Payer: Medicare Other | Admitting: Internal Medicine

## 2022-02-22 ENCOUNTER — Encounter: Payer: Self-pay | Admitting: Internal Medicine

## 2022-02-22 VITALS — BP 133/67 | HR 68 | Temp 98.5°F | Resp 18 | Ht 63.0 in | Wt 191.3 lb

## 2022-02-22 DIAGNOSIS — C3492 Malignant neoplasm of unspecified part of left bronchus or lung: Secondary | ICD-10-CM

## 2022-02-22 DIAGNOSIS — C7931 Secondary malignant neoplasm of brain: Secondary | ICD-10-CM

## 2022-02-22 DIAGNOSIS — Z5111 Encounter for antineoplastic chemotherapy: Secondary | ICD-10-CM | POA: Diagnosis not present

## 2022-02-22 LAB — CBC WITH DIFFERENTIAL (CANCER CENTER ONLY)
Abs Immature Granulocytes: 0.02 10*3/uL (ref 0.00–0.07)
Basophils Absolute: 0.1 10*3/uL (ref 0.0–0.1)
Basophils Relative: 1 %
Eosinophils Absolute: 0.2 10*3/uL (ref 0.0–0.5)
Eosinophils Relative: 2 %
HCT: 43 % (ref 36.0–46.0)
Hemoglobin: 14.8 g/dL (ref 12.0–15.0)
Immature Granulocytes: 0 %
Lymphocytes Relative: 36 %
Lymphs Abs: 2.7 10*3/uL (ref 0.7–4.0)
MCH: 30.8 pg (ref 26.0–34.0)
MCHC: 34.4 g/dL (ref 30.0–36.0)
MCV: 89.6 fL (ref 80.0–100.0)
Monocytes Absolute: 0.5 10*3/uL (ref 0.1–1.0)
Monocytes Relative: 7 %
Neutro Abs: 4.2 10*3/uL (ref 1.7–7.7)
Neutrophils Relative %: 54 %
Platelet Count: 215 10*3/uL (ref 150–400)
RBC: 4.8 MIL/uL (ref 3.87–5.11)
RDW: 13.1 % (ref 11.5–15.5)
WBC Count: 7.7 10*3/uL (ref 4.0–10.5)
nRBC: 0 % (ref 0.0–0.2)

## 2022-02-22 LAB — CMP (CANCER CENTER ONLY)
ALT: 16 U/L (ref 0–44)
AST: 18 U/L (ref 15–41)
Albumin: 4.1 g/dL (ref 3.5–5.0)
Alkaline Phosphatase: 86 U/L (ref 38–126)
Anion gap: 5 (ref 5–15)
BUN: 9 mg/dL (ref 8–23)
CO2: 27 mmol/L (ref 22–32)
Calcium: 9.2 mg/dL (ref 8.9–10.3)
Chloride: 106 mmol/L (ref 98–111)
Creatinine: 0.89 mg/dL (ref 0.44–1.00)
GFR, Estimated: >60 mL/min (ref >60)
Glucose, Bld: 151 mg/dL — ABNORMAL HIGH (ref 70–99)
Potassium: 4 mmol/L (ref 3.5–5.1)
Sodium: 138 mmol/L (ref 135–145)
Total Bilirubin: 0.5 mg/dL (ref 0.3–1.2)
Total Protein: 7.1 g/dL (ref 6.5–8.1)

## 2022-02-22 MED ORDER — FOLIC ACID 1 MG PO TABS: 1.0000 mg | ORAL_TABLET | Freq: Every day | ORAL | 4 refills | Status: DC

## 2022-02-22 MED ORDER — CYANOCOBALAMIN 1000 MCG/ML IJ SOLN
1000.0000 ug | Freq: Once | INTRAMUSCULAR | Status: DC
  Filled 2022-02-22: qty 1

## 2022-02-22 MED ORDER — PROCHLORPERAZINE MALEATE 10 MG PO TABS: 10.0000 mg | ORAL_TABLET | Freq: Four times a day (QID) | ORAL | 0 refills | Status: DC | PRN

## 2022-02-22 NOTE — Research (Signed)
DCP-001: Use of a Clinical Trial Screening Tool to Address Cancer Health Disparities in the [place] Program [facility])  Patient Kimberly Huang was identified by this RN as a potential candidate for the above listed study. This Clinical Research Nurse met with [name], [id number], on 02/22/22 in a manner and location that ensures patient privacy to discuss participation in the above listed research study. Patient is Unaccompanied. A copy of the informed consent document and separate HIPAA Authorization was provided to the patient. Patient reads, speaks, and understands [place].    Patient was provided with the business card of this Nurse and encouraged to contact the research team with any questions.  Approximately 10 minutes were spent with the patient reviewing the informed consent documents. Patient was provided the option of taking informed consent documents home to review and was encouraged to review at their convenience with their support network, including other care providers. Patient took the consent documents home to review.  Will follow-up with patient at her next appointment on Wednesday, 10/25.  Vickii Penna, RN, BSN, CPN Clinical Research Nurse I [phone]  02/22/2022 11:32 AM

## 2022-02-22 NOTE — Progress Notes (Signed)
START ON PATHWAY REGIMEN - Non-Small Cell Lung     A cycle is every 21 days:     Pembrolizumab      Pemetrexed      Carboplatin   **Always confirm dose/schedule in your pharmacy ordering system**  Patient Characteristics: Stage IV Metastatic, Nonsquamous, Molecular Analysis Completed, Molecular Alteration Present and Targeted Therapy Exhausted OR EGFR Exon 20+ or KRAS G12C+ or HER2+ Present and No Prior Chemo/Immunotherapy OR No Alteration Present, Initial  Chemotherapy/Immunotherapy, PS = 0, 1, BRAF/MET/KRAS/HER2 Mutation Positive, Candidate for Immunotherapy, PD-L1 Expression Positive  ? 50% (TPS) and Immunotherapy Candidate Therapeutic Status: Stage IV Metastatic Histology: Nonsquamous Cell Broad Molecular Profiling Status: Molecular Analysis Completed Molecular Analysis Results: KRAS G12C Mutation Present and No Prior Chemo/Immunotherapy ECOG Performance Status: 1 Chemotherapy/Immunotherapy Line of Therapy: Initial Chemotherapy/Immunotherapy Immunotherapy Candidate Status: Candidate for Immunotherapy PD-L1 Expression Status: PD-L1 Positive ? 50% (TPS) Intent of Therapy: Non-Curative / Palliative Intent, Discussed with Patient 

## 2022-02-22 NOTE — Progress Notes (Signed)
[place] [phone]   [phone]  OFFICE PROGRESS NOTE  Patient, No Pcp Per No address on file  DIAGNOSIS: Stage IV (T1c, N2, M1 C) non-small cell lung cancer, adenocarcinoma with positive KRAS G12C mutation diagnosed in September 2023 and presented with left upper lobe lung mass in addition to left hilar and mediastinal lymphadenopathy as well as innumerable brain metastasis.  PRIOR THERAPY: SRS to multiple brain metastasis under the care of Dr. [name] on February 24, 2022.  CURRENT THERAPY: Palliative systemic chemotherapy with carboplatin for AUC of 5, Alimta 500 Mg/M2 and Keytruda 200 Mg IV every 3 weeks.  INTERVAL HISTORY: Kimberly Huang 71 y.o. female returns to the clinic today for follow-up visit.  The patient is feeling fine today with no concerning complaints except for fatigue and shortness of breath at baseline increased with exertion.  She had multiple brain metastasis seen recently and she is scheduled for SRS treatment to the multiple brain lesions under the care of Dr. [name] on February 24, 2022.  She denied having any chest pain, cough or hemoptysis.  She has no nausea, vomiting, diarrhea or constipation.  She has no headache or visual changes.  MEDICAL HISTORY: Past Medical History:  Diagnosis Date   Arthritis    knee, hands   CAD S/P PCI OM1 99%-0%; Plan Staged PCI mRCA 90%. 02/19/2017   1. Severe 2 vessel obstructive CAD    - 99% thrombotic occlusion of first OM. This is a bifurcating vessel.     - 90% mid RCA-segmental 2. Good overall LV dysfunction with lateral HK 3. Moderately elevated LVEDP 4. Successful stenting of the first OM with DES. Distal embolization into the distal lateral OM branch.   Plan: DAPT for one year. Will treat with IV Aggrastat for 18 hours. Start high dose statin, beta blocker. IV Ntg for BP control acutely. Plan for stage PCI of RCA on Monday 35/59 if no complication.   Essential hypertension  02/21/2017   Hyperlipidemia with target LDL less than 70 02/21/2017   Lung cancer ([place])    Presence of drug-eluting stent in L Cx: OM1 (Xience SIERRA DES 3.5 x 15) 02/21/2017   STEMI involving left circumflex coronary artery ([place]) 02/19/2017   Tobacco abuse 02/19/2017    ALLERGIES:  is allergic to codeine.  MEDICATIONS:  Current Outpatient Medications  Medication Sig Dispense Refill   acetaminophen (TYLENOL) 500 MG tablet Take 500 mg by mouth 2 (two) times daily as needed for moderate pain.     aspirin 81 MG chewable tablet Chew 1 tablet (81 mg total) by mouth daily.     atorvastatin (LIPITOR) 80 MG tablet TAKE 1 TABLET BY MOUTH DAILY AT 6 PM. 90 tablet 3   Coenzyme Q10 (CO Q 10) 100 MG CAPS Take 100-200 mg by mouth daily in the afternoon.     diclofenac Sodium (VOLTAREN) 1 % GEL Apply 2 g topically daily as needed (Knee pain).      ezetimibe (ZETIA) 10 MG tablet TAKE 1 TABLET BY MOUTH EVERY DAY (Patient taking differently: Takes at night) 90 tablet 3   irbesartan (AVAPRO) 300 MG tablet Take 1 tablet (300 mg total) by mouth daily. Patient need a appointment for future refills. 90 tablet 1   metoprolol tartrate (LOPRESSOR) 25 MG tablet TAKE 1 TABLET BY MOUTH TWICE A DAY 180 tablet 3   nicotine (NICODERM CQ - DOSED IN MG/24 HOURS) 14 mg/24hr patch Place 1 patch (14 mg total)  onto the skin daily. Apply 39m patch daily x 6 wk, then 7 mg patch daily x 2 wk; start after you quit smoking. 14 patch 2   nicotine (NICODERM CQ - DOSED IN MG/24 HR) 7 mg/24hr patch Place 1 patch (7 mg total) onto the skin daily. Apply 182mpatch daily x 6 wk, then 7 mg patch daily x 2 wk; start after you quit smoking. 14 patch 0   nitroGLYCERIN (NITROSTAT) 0.4 MG SL tablet Place 1 tablet (0.4 mg total) under the tongue every 5 (five) minutes x 3 doses as needed for chest pain. 25 tablet 2   Polyethyl Glycol-Propyl Glycol (SYSTANE FREE OP) Place 2 drops into both eyes daily as needed (Floaters).      traMADol (ULTRAM) 50  MG tablet Take 50 mg by mouth at bedtime.     trolamine salicylate (ASPERCREME) 10 % cream Apply 1 application topically daily as needed (Knee pain).     No current facility-administered medications for this visit.    SURGICAL HISTORY:  Past Surgical History:  Procedure Laterality Date   BRONCHIAL BIOPSY  01/15/2022   Procedure: BRONCHIAL BIOPSIES;  Surgeon: MeMaryjane HurterMD;  [facility];  Service: Pulmonary;;   BRONCHIAL BRUSHINGS  01/15/2022   Procedure: BRONCHIAL BRUSHINGS;  Surgeon: MeMaryjane HurterMD;  [facility];  Service: Pulmonary;;   BRONCHIAL NEEDLE ASPIRATION BIOPSY  01/15/2022   Procedure: BRONCHIAL NEEDLE ASPIRATION BIOPSIES;  Surgeon: MeMaryjane HurterMD;  [facility];  Service: Pulmonary;;   CARDIAC CATHETERIZATION  02/21/2017   CHOLECYSTECTOMY     CORONARY STENT INTERVENTION N/A 02/19/2017   Procedure: CORONARY STENT INTERVENTION;  Surgeon: JoMartiniquePeter M, MD;  [facility];  Service: Cardiovascular;  Laterality: N/A;   CORONARY STENT INTERVENTION N/A 02/21/2017   Procedure: CORONARY STENT INTERVENTION;  Surgeon: SmBelva CromeMD;  [facility];  Service: Cardiovascular;  Laterality: N/A;   FIDUCIAL MARKER PLACEMENT  01/15/2022   Procedure: FIDUCIAL MARKER PLACEMENT;  Surgeon: MeMaryjane HurterMD;  [facility] [facility]: Pulmonary;;   FINE NEEDLE ASPIRATION  01/15/2022   Procedure: FINE NEEDLE ASPIRATION;  Surgeon: MeMaryjane HurterMD;  [facility] [facility]: Pulmonary;;   HEMOSTASIS CONTROL  01/15/2022   Procedure: HEMOSTASIS CONTROL;  Surgeon: MeMaryjane HurterMD;  [facility];  Service: Pulmonary;;   KNEE ARTHROSCOPY Right 2010   KNEE ARTHROSCOPY WITH SUBCHONDROPLASTY Left 01/09/2020   Procedure: Left knee arthroscopy,partial medial meniscectomy, debridement, subchondroplasty left proximal tibia;  Surgeon: BeSusa DayMD;  Location: [name];   Service: Orthopedics;  Laterality: Left;   LEFT HEART CATH AND CORONARY ANGIOGRAPHY N/A 02/19/2017   Procedure: LEFT HEART CATH AND CORONARY ANGIOGRAPHY;  Surgeon: JoMartiniquePeter M, MD;  [facility];  Service: Cardiovascular;  Laterality: N/A;   TONSILLECTOMY     TUBAL LIGATION     VIDEO BRONCHOSCOPY WITH ENDOBRONCHIAL ULTRASOUND N/A 01/15/2022   Procedure: VIDEO BRONCHOSCOPY WITH ENDOBRONCHIAL ULTRASOUND;  Surgeon: MeMaryjane HurterMD;  [facility];  Service: Pulmonary;  Laterality: N/A;   VIDEO BRONCHOSCOPY WITH RADIAL ENDOBRONCHIAL ULTRASOUND  01/15/2022   Procedure: RADIAL ENDOBRONCHIAL ULTRASOUND;  Surgeon: MeMaryjane HurterMD;  [facility];  Service: Pulmonary;;    REVIEW OF SYSTEMS:  Constitutional: positive for fatigue Eyes: negative Ears, nose, mouth, throat, and face: negative Respiratory: positive for dyspnea on exertion Cardiovascular: negative Gastrointestinal: negative Genitourinary:negative Integument/breast: negative Hematologic/lymphatic: negative Musculoskeletal:negative Neurological: negative  Behavioral/Psych: negative Endocrine: negative Allergic/Immunologic: negative   PHYSICAL EXAMINATION: General appearance: alert, cooperative, fatigued, and no distress Head: Normocephalic, without obvious abnormality, atraumatic Neck: no adenopathy, no JVD, supple, symmetrical, trachea midline, and thyroid not enlarged, symmetric, no tenderness/mass/nodules Lymph nodes: Cervical, supraclavicular, and axillary nodes normal. Resp: clear to auscultation bilaterally Back: symmetric, no curvature. ROM normal. No CVA tenderness. Cardio: regular rate and rhythm, S1, S2 normal, no murmur, click, rub or gallop GI: soft, non-tender; bowel sounds normal; no masses,  no organomegaly Extremities: extremities normal, atraumatic, no cyanosis or edema Neurologic: Alert and oriented X 3, normal strength and tone. Normal symmetric reflexes. Normal  coordination and gait  ECOG PERFORMANCE STATUS: 1 - Symptomatic but completely ambulatory  Blood pressure 133/67, pulse 68, temperature 98.5 F (36.9 C), temperature source Oral, resp. rate 18, height 5' 3" (1.6 m), weight 191 lb 5 oz (86.8 kg), SpO2 97 %.  LABORATORY DATA: Lab Results  Component Value Date   WBC 7.7 02/22/2022   HGB 14.8 02/22/2022   HCT 43.0 02/22/2022   MCV 89.6 02/22/2022   PLT 215 02/22/2022      Chemistry      Component Value Date/Time   NA 140 02/08/2022 1149   NA 143 12/08/2020 1620   K 4.0 02/08/2022 1149   CL 106 02/08/2022 1149   CO2 29 02/08/2022 1149   BUN 9 02/08/2022 1149   BUN 11 12/08/2020 1620   CREATININE 0.89 02/08/2022 1149      Component Value Date/Time   CALCIUM 9.1 02/08/2022 1149   ALKPHOS 97 02/08/2022 1149   AST 17 02/08/2022 1149   ALT 16 02/08/2022 1149   BILITOT 0.4 02/08/2022 1149       RADIOGRAPHIC STUDIES: MR Brain W Wo Contrast  Addendum Date: 02/08/2022   ADDENDUM REPORT: 02/08/2022 08:00 ADDENDUM: Upon further review, there is an additional punctate enhancing metastasis within the left temporo-occipital lobes (series 13, image 76). These results will be called to the ordering clinician or representative by the Radiologist Assistant, and communication documented in the PACS or [employer]. Electronically Signed   By: Kellie Simmering D.O.   On: 02/08/2022 08:00   Result Date: 02/08/2022 CLINICAL DATA:  Provided history: Brain/CNS neoplasm, staging. Metastatic lung to brain. Metastasis to brain. EXAM: MRI HEAD WITHOUT AND WITH CONTRAST TECHNIQUE: Multiplanar, multiecho pulse sequences of the brain and surrounding structures were obtained without and with intravenous contrast. CONTRAST:  9 mL Vueway intravenous contrast. COMPARISON:  Brain MRI 01/27/2022. FINDINGS: Brain: Mild generalized parenchymal atrophy. Multiple enhancing intracranial metastases. The following metastases are new or larger: Punctate enhancing lesion  within the mid left frontal lobe, not definitively present on the prior MRI (series 13, image 112). Punctate enhancing lesion within the left lentiform nucleus, not definitively present on the prior MRI (series 13, image 97). 8 mm enhancing lesion within the left parietal lobe, increased in size (previously 6 mm) (series 13, image 109). 8 mm enhancing lesion within the right postcentral gyrus, slightly increased in size (previously measuring 6-7 mm) (series 13, image 121). Mild edema at this site, also slightly increased. 12 mm lesion within the medial left cerebellar hemisphere/cerebellar vermis, slightly increased in size (previously 11 mm). Mild edema at this site has also slightly increased. 4 mm lesion within the right cerebellar hemisphere, slightly increased in size (previously 3 mm) (series 13, image 41). The following lesions have remained stable in size: 4 mm lesion within the medial left precentral gyrus (series 13, image 142). 5 mm  lesion within the mid-to-posterior left frontal lobe (series 13, image 132). 9 mm lesion within the mid-to-anterior left frontal lobe (series 13, image 119). 2 mm focus of enhancement within the anterolateral left frontal lobe (series 13, image 94). This finding was present on the prior MRI, and may reflect a metastasis or vascular enhancement. 3-4 mm lesion within the medial right parietal lobe (series 13, image 113). 13 mm lesion within the right occipital lobe (series 13, image 78). 4 mm lesion more inferiorly within the medial right occipital lobe (series 13, image 58). 4 mm lesion more laterally within the right occipital lobe (series 13, image 59). 4 mm enhancing lesion within the left cerebellar hemisphere (series 13, image 33). Unchanged small choroidal fissure cyst on the right. There is no acute infarct. No chronic intracranial blood products. No extra-axial fluid collection. No midline shift. Vascular: Maintained flow voids within the proximal large arterial  vessels. Skull and upper cervical spine: No focal suspicious marrow lesion. Incompletely assessed cervical spondylosis. Sinuses/Orbits: No mass or acute finding within the imaged orbits. No significant paranasal sinus disease. Other: Trace fluid within left mastoid air cells. IMPRESSION: 1. Multiple intracranial metastases, as detailed. 2. There are small lesions within the left frontal lobe and left lentiform nucleus which were not definitively present on the prior MRI of 01/27/2022. Additionally, there are enhancing lesions within the left parietal lobe, right postcentral gyrus, medial left cerebellar hemisphere/cerebellar vermis and right cerebellar hemisphere which have slightly increased in size. 3. A 2 mm focus of enhancement within the anterolateral left frontal lobe may reflect a metastasis or vascular enhancement. 4. The remaining intracranial metastases have remained stable. Electronically Signed: By: Kellie Simmering D.O. On: 02/07/2022 11:54   MR BRAIN W WO CONTRAST  Result Date: 01/28/2022 CLINICAL DATA:  Non-small-cell lung cancer staging EXAM: MRI HEAD WITHOUT AND WITH CONTRAST TECHNIQUE: Multiplanar, multiecho pulse sequences of the brain and surrounding structures were obtained without and with intravenous contrast. CONTRAST:  8 mL Vueway IV COMPARISON:  None Available. FINDINGS: Brain: Multiple enhancing lesions the brain compatible with metastatic disease. No associated hemorrhage. Mild edema without mass-effect or midline shift. 3 mm lesion right inferior cerebellum axial image 35 3 mm lesion left inferior cerebellum axial image 29 11 mm enhancing lesion left posterior cerebellum axial image 39 3 mm lesion right occipital lobe axial image 59 3 mm peripheral enhancing lesion right lateral occipital lobe axial image 59 13 mm enhancing lesion right occipital lobe axial image 70 2 mm lesion right medial parietal lobe axial image 106 5 mm enhancing lesion right postcentral cortex axial image 114 6 mm  enhancing lesion left parietal lobe axial image 104 8 mm lesion left frontal lobe axial image 106 4 mm lesion left frontal lobe axial image 120 4 mm lesion left medial precentral cortex axial image 135 Negative for acute infarct. Vascular: Normal arterial flow voids at the skull base. Skull and upper cervical spine: No skeletal metastasis. Sinuses/Orbits: Paranasal sinuses clear.  Negative orbit Other: None IMPRESSION: Widespread metastatic disease to the brain. Most lesions are small without significant edema or midline shift. No associated hemorrhage. Electronically Signed   By: Franchot Gallo M.D.   On: 01/28/2022 14:10    ASSESSMENT AND PLAN: This is a very pleasant 71 years old white female with Stage IV (T1c, N2, M1 C) non-small cell lung cancer, adenocarcinoma with positive KRAS G12C mutation diagnosed in September 2023 and presented with left upper lobe lung mass in addition to left hilar and  mediastinal lymphadenopathy as well as innumerable brain metastasis. The patient is scheduled for SRS to multiple brain metastasis on February 24, 2022. I had a lengthy discussion with the patient today about her current condition and treatment options. The patient knows that she has incurable condition and all the treatment will be of palliative nature. I gave her the option of palliative care and hospice referral versus consideration of palliative systemic chemotherapy with carboplatin for AUC of 5, Alimta 500 Mg/M2 and Keytruda 200 Mg IV every 3 weeks. The patient is interested in treatment and she is expected to start the first cycle of this treatment next week. I discussed with her the adverse effect of the chemotherapy and immunotherapy including but not limited to alopecia, myelosuppression, nausea and vomiting, peripheral neuropathy, liver or renal dysfunction as well as immunotherapy adverse effects. I will arrange for the patient to have a chemotherapy education class before the first dose of her  treatment. She will receive vitamin B12 injection today. I will call her pharmacy with prescription for Compazine 10 mg p.o. every 6 hours as needed for nausea in addition to folic acid 1 mg p.o. daily. The patient will come back for follow-up visit in 2 weeks for evaluation and management of any adverse effect of her treatment. For the smoking cessation she is currently on NicoDerm patch. She was advised to call immediately if she has any concerning symptoms in the interval. The patient voices understanding of current disease status and treatment options and is in agreement with the current care plan.  All questions were answered. The patient knows to call the clinic with any problems, questions or concerns. We can certainly see the patient much sooner if necessary.  The total time spent in the appointment was 30 minutes.  Disclaimer: This note was dictated with voice recognition software. Similar sounding words can inadvertently be transcribed and may not be corrected upon review.

## 2022-02-23 ENCOUNTER — Other Ambulatory Visit: Payer: Self-pay

## 2022-02-23 DIAGNOSIS — Z5111 Encounter for antineoplastic chemotherapy: Secondary | ICD-10-CM | POA: Diagnosis not present

## 2022-02-24 ENCOUNTER — Other Ambulatory Visit: Payer: Self-pay

## 2022-02-24 ENCOUNTER — Encounter: Payer: Self-pay | Admitting: Radiation Oncology

## 2022-02-24 ENCOUNTER — Ambulatory Visit
Admission: RE | Admit: 2022-02-24 | Discharge: 2022-02-24 | Disposition: A | Discharge status: Home / Self Care | Payer: Medicare Other | Source: Ambulatory Visit | Attending: Radiation Oncology | Admitting: Radiation Oncology

## 2022-02-24 ENCOUNTER — Other Ambulatory Visit: Payer: Self-pay | Admitting: Medical Oncology

## 2022-02-24 ENCOUNTER — Inpatient Hospital Stay: Payer: Medicare Other

## 2022-02-24 DIAGNOSIS — C3492 Malignant neoplasm of unspecified part of left bronchus or lung: Secondary | ICD-10-CM

## 2022-02-24 DIAGNOSIS — Z5111 Encounter for antineoplastic chemotherapy: Secondary | ICD-10-CM | POA: Diagnosis not present

## 2022-02-24 LAB — RAD ONC ARIA SESSION SUMMARY
Course Elapsed Days: 0
Plan Fractions Treated to Date: 1
Plan Prescribed Dose Per Fraction: 20 Gy
Plan Total Fractions Prescribed: 1
Plan Total Prescribed Dose: 20 Gy
Reference Point Dosage Given to Date: 20 Gy
Reference Point Session Dosage Given: 20 Gy
Session Number: 1

## 2022-02-24 MED ORDER — CYANOCOBALAMIN 1000 MCG/ML IJ SOLN
1000.0000 ug | Freq: Once | INTRAMUSCULAR | Status: AC
  Administered 2022-02-24: 1000 ug via INTRAMUSCULAR
  Filled 2022-02-24: qty 1

## 2022-02-24 NOTE — Progress Notes (Addendum)
  Ms. Bickham rested with [place] for 30 minutes following SRS treatment.  Patient denies headache, dizziness, nausea, diplopia or ringing in the ears. Denies fatigue. Patient without complaints. Understands to avoid strenuous activity for the next 24 hours and call [phone] with needs. Pt ambulated out of clinic without incident.  Vitals:   02/24/22 1346  BP: (!) 142/67  Pulse: 69  Resp: 18  Temp: 98.1 F (36.7 C)  SpO2: 97%

## 2022-02-24 NOTE — Patient Instructions (Signed)
Vitamin B12 Deficiency Vitamin B12 deficiency means that your body does not have enough vitamin B12. The body needs this important vitamin: To make red blood cells. To make genes (DNA). To help the nerves work. If you do not have enough vitamin B12 in your body, you can have health problems, such as not having enough red blood cells in the blood (anemia). What are the causes? Not eating enough foods that contain vitamin B12. Not being able to take in (absorb) vitamin B12 from the food that you eat. Certain diseases. A condition in which the body does not make enough of a certain protein. This results in your body not taking in enough vitamin B12. Having a surgery in which part of the stomach or small intestine is taken out. Taking medicines that make it hard for the body to take in vitamin B12. These include: Heartburn medicines. Some medicines that are used to treat diabetes. What increases the risk? Being an older adult. Eating a vegetarian or vegan diet that does not include any foods that come from animals. Not eating enough foods that contain vitamin B12 while you are pregnant. Taking certain medicines. Having alcoholism. What are the signs or symptoms? In some cases, there are no symptoms. If the condition leads to too few blood cells or nerve damage, symptoms can occur, such as: Feeling weak or tired. Not being hungry. Losing feeling (numbness) or tingling in your hands and feet. Redness and burning of the tongue. Feeling sad (depressed). Confusion or memory problems. Trouble walking. If anemia is very bad, symptoms can include: Being short of breath. Being dizzy. Having a very fast heartbeat. How is this treated? Changing the way you eat and drink, such as: Eating more foods that contain vitamin B12. Drinking little or no alcohol. Getting vitamin B12 shots. Taking vitamin B12 supplements by mouth (orally). Your doctor will tell you the dose that is best for you. Follow  these instructions at home: Eating and drinking  Eat foods that come from animals and have a lot of vitamin B12 in them. These include: Meats and poultry. This includes beef, pork, chicken, turkey, and organ meats, such as liver. Seafood, such as clams, rainbow trout, salmon, tuna, and haddock. Eggs. Dairy foods such as milk, yogurt, and cheese. Eat breakfast cereals that have vitamin B12 added to them (are fortified). Check the label. The items listed above may not be a complete list of foods and beverages you can eat and drink. Contact a dietitian for more information. Alcohol use Do not drink alcohol if: Your doctor tells you not to drink. You are pregnant, may be pregnant, or are planning to become pregnant. If you drink alcohol: Limit how much you have to: 0-1 drink a day for women. 0-2 drinks a day for men. Know how much alcohol is in your drink. In the [place] one drink equals one 12 oz bottle of beer (355 mL), one 5 oz glass of wine (148 mL), or one 1 oz glass of hard liquor (44 mL). General instructions Get any vitamin B12 shots if told by your doctor. Take supplements only as told by your doctor. Follow the directions. Keep all follow-up visits. Contact a doctor if: Your symptoms come back. Your symptoms get worse or do not get better with treatment. Get help right away if: You have trouble breathing. You have a very fast heartbeat. You have chest pain. You get dizzy. You faint. These symptoms may be an emergency. Get help right away. Call 911.   Do not wait to see if the symptoms will go away. Do not drive yourself to the hospital. Summary Vitamin B12 deficiency means that your body is not getting enough of the vitamin. In some cases, there are no symptoms of this condition. Treatment may include making a change in the way you eat and drink, getting shots, or taking supplements. Eat foods that have vitamin B12 in them. This information is not intended to replace advice  given to you by your health care provider. Make sure you discuss any questions you have with your health care provider. Document Revised: 12/12/2020 Document Reviewed: 12/12/2020 Elsevier Patient Education  2023 [employer].  

## 2022-02-26 ENCOUNTER — Telehealth: Payer: Self-pay | Admitting: Internal Medicine

## 2022-02-26 ENCOUNTER — Other Ambulatory Visit: Payer: Self-pay

## 2022-02-26 MED FILL — Fosaprepitant Dimeglumine For IV Infusion 150 MG (Base Eq): INTRAVENOUS | Qty: 5 | Status: AC

## 2022-02-26 MED FILL — Dexamethasone Sodium Phosphate Inj 100 MG/10ML: INTRAMUSCULAR | Qty: 1 | Status: AC

## 2022-02-26 NOTE — Telephone Encounter (Signed)
Scheduled per 10/23 los, patient has been called and notified.

## 2022-02-27 ENCOUNTER — Other Ambulatory Visit: Payer: Self-pay

## 2022-02-28 ENCOUNTER — Other Ambulatory Visit: Payer: Self-pay

## 2022-03-01 ENCOUNTER — Inpatient Hospital Stay: Payer: Medicare Other

## 2022-03-01 ENCOUNTER — Telehealth: Payer: Self-pay

## 2022-03-01 VITALS — BP 149/65 | HR 71 | Temp 98.2°F | Resp 16

## 2022-03-01 DIAGNOSIS — C3492 Malignant neoplasm of unspecified part of left bronchus or lung: Secondary | ICD-10-CM

## 2022-03-01 DIAGNOSIS — Z5111 Encounter for antineoplastic chemotherapy: Secondary | ICD-10-CM | POA: Diagnosis not present

## 2022-03-01 LAB — CMP (CANCER CENTER ONLY)
ALT: 15 U/L (ref 0–44)
AST: 18 U/L (ref 15–41)
Albumin: 3.9 g/dL (ref 3.5–5.0)
Alkaline Phosphatase: 87 U/L (ref 38–126)
Anion gap: 8 (ref 5–15)
BUN: 8 mg/dL (ref 8–23)
CO2: 24 mmol/L (ref 22–32)
Calcium: 8.8 mg/dL — ABNORMAL LOW (ref 8.9–10.3)
Chloride: 103 mmol/L (ref 98–111)
Creatinine: 0.76 mg/dL (ref 0.44–1.00)
GFR, Estimated: >60 mL/min (ref >60)
Glucose, Bld: 152 mg/dL — ABNORMAL HIGH (ref 70–99)
Potassium: 3.6 mmol/L (ref 3.5–5.1)
Sodium: 135 mmol/L (ref 135–145)
Total Bilirubin: 0.6 mg/dL (ref 0.3–1.2)
Total Protein: 7.1 g/dL (ref 6.5–8.1)

## 2022-03-01 LAB — CBC WITH DIFFERENTIAL (CANCER CENTER ONLY)
Abs Immature Granulocytes: 0.03 10*3/uL (ref 0.00–0.07)
Basophils Absolute: 0 10*3/uL (ref 0.0–0.1)
Basophils Relative: 0 %
Eosinophils Absolute: 0.1 10*3/uL (ref 0.0–0.5)
Eosinophils Relative: 1 %
HCT: 40.5 % (ref 36.0–46.0)
Hemoglobin: 14.1 g/dL (ref 12.0–15.0)
Immature Granulocytes: 0 %
Lymphocytes Relative: 14 %
Lymphs Abs: 1.3 10*3/uL (ref 0.7–4.0)
MCH: 30.5 pg (ref 26.0–34.0)
MCHC: 34.8 g/dL (ref 30.0–36.0)
MCV: 87.5 fL (ref 80.0–100.0)
Monocytes Absolute: 0.5 10*3/uL (ref 0.1–1.0)
Monocytes Relative: 6 %
Neutro Abs: 7 10*3/uL (ref 1.7–7.7)
Neutrophils Relative %: 79 %
Platelet Count: 217 10*3/uL (ref 150–400)
RBC: 4.63 MIL/uL (ref 3.87–5.11)
RDW: 12.8 % (ref 11.5–15.5)
WBC Count: 8.9 10*3/uL (ref 4.0–10.5)
nRBC: 0 % (ref 0.0–0.2)

## 2022-03-01 LAB — TSH: TSH: 1.114 u[IU]/mL (ref 0.350–4.500)

## 2022-03-01 MED ORDER — PALONOSETRON HCL INJECTION 0.25 MG/5ML
0.2500 mg | Freq: Once | INTRAVENOUS | Status: AC
  Administered 2022-03-01: 0.25 mg via INTRAVENOUS
  Filled 2022-03-01: qty 5

## 2022-03-01 MED ORDER — SODIUM CHLORIDE 0.9 % IV SOLN
468.5000 mg | Freq: Once | INTRAVENOUS | Status: AC
  Administered 2022-03-01: 470 mg via INTRAVENOUS
  Filled 2022-03-01: qty 47

## 2022-03-01 MED ORDER — SODIUM CHLORIDE 0.9 % IV SOLN
150.0000 mg | Freq: Once | INTRAVENOUS | Status: AC
  Administered 2022-03-01: 150 mg via INTRAVENOUS
  Filled 2022-03-01: qty 150

## 2022-03-01 MED ORDER — SODIUM CHLORIDE 0.9 % IV SOLN
500.0000 mg/m2 | Freq: Once | INTRAVENOUS | Status: AC
  Administered 2022-03-01: 1000 mg via INTRAVENOUS
  Filled 2022-03-01: qty 40

## 2022-03-01 MED ORDER — SODIUM CHLORIDE 0.9 % IV SOLN: Freq: Once | INTRAVENOUS | Status: AC

## 2022-03-01 MED ORDER — SODIUM CHLORIDE 0.9 % IV SOLN
10.0000 mg | Freq: Once | INTRAVENOUS | Status: AC
  Administered 2022-03-01: 10 mg via INTRAVENOUS
  Filled 2022-03-01: qty 10

## 2022-03-01 MED ORDER — SODIUM CHLORIDE 0.9 % IV SOLN
200.0000 mg | Freq: Once | INTRAVENOUS | Status: AC
  Administered 2022-03-01: 200 mg via INTRAVENOUS
  Filled 2022-03-01: qty 200

## 2022-03-01 NOTE — Telephone Encounter (Signed)
DCP-001: Use of a Clinical Trial Screening Tool to Address Cancer Health Disparities in the [facility])  A Randomized pragmatic Chair-Based Home Exercise Intervention for Mitigating Cancer-Related Fatigue in Older Adults Undergoing Chemotherapy for Advanced Disease  Met with patient during her scheduled infusion to discuss interest in above-listed studies. Patient declined DCP study. Patient screened for Chair Exercise study, but reported that she was active more than 60 minutes/week, and is therefore not eligible.  Patient thanked for her time and consideration.  Vickii Penna, RN, BSN, CPN Clinical Research Nurse I [phone]  03/01/2022 3:13 PM

## 2022-03-01 NOTE — Patient Instructions (Signed)
[facility]  Discharge Instructions: Thank you for choosing [facility] to provide your oncology and hematology care.   If you have a lab appointment with the [facility], please go directly to the [facility] and check in at the registration area.   Wear comfortable clothing and clothing appropriate for easy access to any Portacath or PICC line.   We strive to give you quality time with your provider. You may need to reschedule your appointment if you arrive late (15 or more minutes).  Arriving late affects you and other patients whose appointments are after yours.  Also, if you miss three or more appointments without notifying the office, you may be dismissed from the clinic at the provider's discretion.      For prescription refill requests, have your pharmacy contact our office and allow 72 hours for refills to be completed.    Today you received the following chemotherapy and/or immunotherapy agents: Keytruda/Alimta/Carboplatin      To help prevent nausea and vomiting after your treatment, we encourage you to take your nausea medication as directed.  BELOW ARE SYMPTOMS THAT SHOULD BE REPORTED IMMEDIATELY: *FEVER GREATER THAN 100.4 F (38 C) OR HIGHER *CHILLS OR SWEATING *NAUSEA AND VOMITING THAT IS NOT CONTROLLED WITH YOUR NAUSEA MEDICATION *UNUSUAL SHORTNESS OF BREATH *UNUSUAL BRUISING OR BLEEDING *URINARY PROBLEMS (pain or burning when urinating, or frequent urination) *BOWEL PROBLEMS (unusual diarrhea, constipation, pain near the anus) TENDERNESS IN MOUTH AND THROAT WITH OR WITHOUT PRESENCE OF ULCERS (sore throat, sores in mouth, or a toothache) UNUSUAL RASH, SWELLING OR PAIN  UNUSUAL VAGINAL DISCHARGE OR ITCHING   Items with * indicate a potential emergency and should be followed up as soon as possible or go to the Emergency Department if any problems should occur.  Please show the CHEMOTHERAPY ALERT CARD or IMMUNOTHERAPY ALERT  CARD at check-in to the Emergency Department and triage nurse.  Should you have questions after your visit or need to cancel or reschedule your appointment, please contact [facility]  Dept: [phone]  and follow the prompts.  Office hours are 8:00 a.m. to 4:30 p.m. Monday - Friday. Please note that voicemails left after 4:00 p.m. may not be returned until the following business day.  We are closed weekends and major holidays. You have access to a nurse at all times for urgent questions. Please call the main number to the clinic Dept: [phone] and follow the prompts.   For any non-urgent questions, you may also contact your provider using MyChart. We now offer e-Visits for anyone 26 and older to request care online for non-urgent symptoms. For details visit [email or web address].   Also download the MyChart app! Go to the app store, search "MyChart", open the app, select [facility], and log in with your MyChart username and password.  Masks are optional in the cancer centers. If you would like for your care team to wear a mask while they are taking care of you, please let them know. You may have one support person who is at least 73 years old accompany you for your appointments.

## 2022-03-02 ENCOUNTER — Encounter: Payer: Self-pay | Admitting: Internal Medicine

## 2022-03-02 ENCOUNTER — Telehealth: Payer: Self-pay | Admitting: *Deleted

## 2022-03-02 NOTE — Telephone Encounter (Signed)
Pt states boil looks good today. States that she does not have any nausea. Is pleasantly surprised! Was very nervous prior to treatment. States Janifer Adie did such a great job of educating her. Alben Spittle and Thurmond Butts were wonderful and very professional. Several different staff came by to check on her. Is eating and drinking without any problems.

## 2022-03-02 NOTE — Telephone Encounter (Signed)
-----   Message from Willis Modena, RN sent at 03/01/2022  3:55 PM EDT ----- Regarding: Dr. [name] 1st time Keytruda/Alimta/Carbo f/u tol well Dr. [name] 1st time Keytruda/Alimta/Carbo. Pt tolerated tx well. Pt has a boil on her right breast that Pt is monitoring, can you check with her about this? Pt call back due.

## 2022-03-02 NOTE — Op Note (Signed)
  Name: Kimberly Huang  MRN: [id number]  Date: 02/24/2022   DOB: 1950/07/13  Stereotactic Radiosurgery Operative Note  PRE-OPERATIVE DIAGNOSIS:  Lung Cancer, Multiple Brain Metastases  POST-OPERATIVE DIAGNOSIS:  Lung Cancer, Multiple Brain Metastases  PROCEDURE:  Stereotactic Radiosurgery  SURGEON:  Jairo Ben, MD  RADIATION ONCOLOGIST: Dr. Eppie Gibson, MD  NARRATIVE: The patient underwent a radiation treatment planning session in the radiation oncology simulation suite under the care of the radiation oncology physician and physicist.  I participated closely in the radiation treatment planning afterwards. The patient underwent planning CT which was fused to 3T high resolution MRI with 1 mm axial slices.  These images were fused on the planning system.  We contoured the gross target volumes and subsequently expanded this to yield the Planning Target Volume. I actively participated in the planning process.  I helped to define and review the target contours and also the contours of the optic pathway, eyes, brainstem and selected nearby organs at risk.  All the dose constraints for critical structures were reviewed and compared to AAPM Task Group 101.  The prescription dose conformity was reviewed.  I approved the plan electronically.    Accordingly, Kimberly Huang was brought to the TrueBeam stereotactic radiation treatment linac and placed in the custom immobilization mask.  The patient was aligned according to the IR fiducial markers with BrainLab Exactrac, then orthogonal x-rays were used in ExacTrac with the 6DOF robotic table and the shifts were made to align the patient  Kimberly Huang received stereotactic radiosurgery uneventfully.    Lesions treated:   16    Complex lesions treated:  0 (>3.5 cm, <24mm of optic path, or within the brainstem)   The detailed description of the procedure is recorded in the radiation oncology procedure note.  I was present for the duration of  the procedure.  DISPOSITION:  Following delivery, the patient was transported to nursing in stable condition and monitored for possible acute effects to be discharged to home in stable condition with follow-up in one month.  Jairo Ben, MD 03/02/2022 10:06 AM

## 2022-03-02 NOTE — Telephone Encounter (Signed)
Pt states boil looks good today. States that she does not have any nausea. Is pleasantly surprised! Was very nervous prior to treatment. States Janifer Adie did such a great job of educating her. Alben Spittle and Thurmond Butts were wonderful and very professional.

## 2022-03-02 NOTE — Progress Notes (Shared)
                                                                                                                                                             Patient Name: Kimberly Huang MRN: [id number] DOB: 1950-07-22 Referring Physician: [name] Date of Service: 02/24/2022 [facility], [place]                                                        End Of Treatment Note  Diagnoses: C79.31-Secondary malignant neoplasm of brain  Cancer Staging:  Cancer Staging  Adenocarcinoma of left lung, stage 4 ([place]) Staging form: Lung, AJCC 8th Edition - Clinical: Stage IVB (cT1c, cN2, cM1c) - Signed by Curt Bears, MD on 02/08/2022    Intent: Palliative  Radiation Treatment Dates: 02/24/2022 through 02/24/2022 Site Technique Total Dose (Gy) Dose per Fx (Gy) Completed Fx Beam Energies  Brain: Brain IMRT 20/20 20 1/1 6XFFF   Narrative: The patient tolerated radiation therapy relatively well. ***  Plan: The patient will follow-up with radiation oncology in *** .  -----------------------------------  Eppie Gibson, MD

## 2022-03-03 ENCOUNTER — Other Ambulatory Visit: Payer: Self-pay | Admitting: Cardiology

## 2022-03-08 ENCOUNTER — Other Ambulatory Visit: Payer: Self-pay

## 2022-03-16 ENCOUNTER — Encounter: Payer: Self-pay | Admitting: Internal Medicine

## 2022-03-16 ENCOUNTER — Telehealth: Payer: Self-pay | Admitting: *Deleted

## 2022-03-16 NOTE — Progress Notes (Signed)
Received call from patient after receiving my card per my request.  Introduced myself as Arboriculturist and to offer available resources.  Discussed one-time $1000 Radio broadcast assistant to assist with personal expenses while going through treatment. Advised what is needed to apply. She will bring at next appointment on 11/20 and provide to registration staff. She will then be given grant paperwork to complete and call me at earliest convenience afterwards to discuss expenses in detail.  She has my card for any additional financial questions or concerns.

## 2022-03-16 NOTE — Telephone Encounter (Signed)
Patient states the boil on her breast has not improved. Was told to let Dr [name] know and he might put her on antibiotics.

## 2022-03-17 ENCOUNTER — Encounter: Payer: Self-pay | Admitting: Medical Oncology

## 2022-03-17 NOTE — Progress Notes (Signed)
[facility] OFFICE PROGRESS NOTE  Patient, No Pcp Per No address on file  DIAGNOSIS:  Stage IV (T1c, N2, M1 C) non-small cell lung cancer, adenocarcinoma with positive KRAS G12C mutation diagnosed in September 2023 and presented with left upper lobe lung mass in addition to left hilar and mediastinal lymphadenopathy as well as innumerable brain metastasis.   PDL1 Expression: 93%   PRIOR THERAPY: SRS to multiple brain metastasis under the care of Dr. [name] on February 24, 2022.   CURRENT THERAPY: Palliative systemic chemotherapy with carboplatin for AUC of 5, Alimta 500 Mg/M2 and Keytruda 200 Mg IV every 3 weeks. Status post 1 cycle  INTERVAL HISTORY: Kimberly Huang 71 y.o. female returns to the clinic today for a follow-up visit.  The patient was recently diagnosed with lung cancer.  She is currently undergoing palliative systemic chemotherapy and is status post 1 cycle.  She tolerated this well without any concerning adverse side effects except for some nausea/vomiting a few days after treatment. After a few days, she recovered and felt better after that. Moving forward, she is going to take her compazine upon returning home from treatment and in the mornings the few days following treatment. She lost a few pounds which she believes if from the days of decreased appetite secondary to nausea/vomiting. She denies any fever, chills, or night sweats,. She denies significant dyspnea on exertion. She denies any chest pain, cough, or hemoptysis.  Denies any diarrhea. She had very mild constipation that resolves on its own, but she is going to try colace. Denies any headache or visual changes. She is here today for evaluation and repeat blood work before undergoing cycle #2.    MEDICAL HISTORY: Past Medical History:  Diagnosis Date   Arthritis    knee, hands   CAD S/P PCI OM1 99%-0%; Plan Staged PCI mRCA 90%. 02/19/2017   1. Severe 2 vessel obstructive CAD    - 99% thrombotic  occlusion of first OM. This is a bifurcating vessel.     - 90% mid RCA-segmental 2. Good overall LV dysfunction with lateral HK 3. Moderately elevated LVEDP 4. Successful stenting of the first OM with DES. Distal embolization into the distal lateral OM branch.   Plan: DAPT for one year. Will treat with IV Aggrastat for 18 hours. Start high dose statin, beta blocker. IV Ntg for BP control acutely. Plan for stage PCI of RCA on Monday 91/63 if no complication.   Essential hypertension 02/21/2017   Hyperlipidemia with target LDL less than 70 02/21/2017   Lung cancer ([place])    Presence of drug-eluting stent in L Cx: OM1 (Xience SIERRA DES 3.5 x 15) 02/21/2017   STEMI involving left circumflex coronary artery ([place]) 02/19/2017   Tobacco abuse 02/19/2017    ALLERGIES:  is allergic to codeine.  MEDICATIONS:  Current Outpatient Medications  Medication Sig Dispense Refill   acetaminophen (TYLENOL) 500 MG tablet Take 500 mg by mouth 2 (two) times daily as needed for moderate pain.     aspirin 81 MG chewable tablet Chew 1 tablet (81 mg total) by mouth daily.     atorvastatin (LIPITOR) 80 MG tablet TAKE 1 TABLET BY MOUTH DAILY AT 6 PM. 90 tablet 3   Coenzyme Q10 (CO Q 10) 100 MG CAPS Take 100-200 mg by mouth daily in the afternoon.     diclofenac Sodium (VOLTAREN) 1 % GEL Apply 2 g topically daily as needed (Knee pain).      ezetimibe (ZETIA) 10  MG tablet TAKE 1 TABLET BY MOUTH EVERY DAY (Patient taking differently: Takes at night) 90 tablet 3   folic acid (FOLVITE) 1 MG tablet Take 1 tablet (1 mg total) by mouth daily. 30 tablet 4   irbesartan (AVAPRO) 300 MG tablet Take 1 tablet (300 mg total) by mouth daily. Please contact the office to schedule appointment for additional refills. 1st Attempt. 30 tablet 0   metoprolol tartrate (LOPRESSOR) 25 MG tablet TAKE 1 TABLET BY MOUTH TWICE A DAY 180 tablet 3   nicotine (NICODERM CQ - DOSED IN MG/24 HOURS) 14 mg/24hr patch Place 1 patch (14 mg total) onto the skin  daily. Apply 47m patch daily x 6 wk, then 7 mg patch daily x 2 wk; start after you quit smoking. 14 patch 2   nicotine (NICODERM CQ - DOSED IN MG/24 HR) 7 mg/24hr patch Place 1 patch (7 mg total) onto the skin daily. Apply 167mpatch daily x 6 wk, then 7 mg patch daily x 2 wk; start after you quit smoking. 14 patch 0   nitroGLYCERIN (NITROSTAT) 0.4 MG SL tablet Place 1 tablet (0.4 mg total) under the tongue every 5 (five) minutes x 3 doses as needed for chest pain. 25 tablet 2   ondansetron (ZOFRAN) 8 MG tablet Take 1 tablet (8 mg total) by mouth every 8 (eight) hours as needed for nausea or vomiting. Starting day 3 after chemotherapy 30 tablet 2   Polyethyl Glycol-Propyl Glycol (SYSTANE FREE OP) Place 2 drops into both eyes daily as needed (Floaters).      traMADol (ULTRAM) 50 MG tablet Take 50 mg by mouth at bedtime.     trolamine salicylate (ASPERCREME) 10 % cream Apply 1 application topically daily as needed (Knee pain).     prochlorperazine (COMPAZINE) 10 MG tablet Take 1 tablet (10 mg total) by mouth every 6 (six) hours as needed for nausea or vomiting. 30 tablet 2   No current facility-administered medications for this visit.    SURGICAL HISTORY:  Past Surgical History:  Procedure Laterality Date   BRONCHIAL BIOPSY  01/15/2022   Procedure: BRONCHIAL BIOPSIES;  Surgeon: MeMaryjane HurterMD;  [facility];  Service: Pulmonary;;   BRONCHIAL BRUSHINGS  01/15/2022   Procedure: BRONCHIAL BRUSHINGS;  Surgeon: MeMaryjane HurterMD;  [facility];  Service: Pulmonary;;   BRONCHIAL NEEDLE ASPIRATION BIOPSY  01/15/2022   Procedure: BRONCHIAL NEEDLE ASPIRATION BIOPSIES;  Surgeon: MeMaryjane HurterMD;  [facility];  Service: Pulmonary;;   CARDIAC CATHETERIZATION  02/21/2017   CHOLECYSTECTOMY     CORONARY STENT INTERVENTION N/A 02/19/2017   Procedure: CORONARY STENT INTERVENTION;  Surgeon: JoMartiniquePeter M, MD;  [facility];  Service:  Cardiovascular;  Laterality: N/A;   CORONARY STENT INTERVENTION N/A 02/21/2017   Procedure: CORONARY STENT INTERVENTION;  Surgeon: SmBelva CromeMD;  [facility];  Service: Cardiovascular;  Laterality: N/A;   FIDUCIAL MARKER PLACEMENT  01/15/2022   Procedure: FIDUCIAL MARKER PLACEMENT;  Surgeon: MeMaryjane HurterMD;  [facility];;   FINE NEEDLE ASPIRATION  01/15/2022   Procedure: FINE NEEDLE ASPIRATION;  Surgeon: MeMaryjane HurterMD;  [facility]: [facility];;   HEMOSTASIS CONTROL  01/15/2022   Procedure: HEMOSTASIS CONTROL;  Surgeon: MeMaryjane HurterMD;  [facility];  Service: Pulmonary;;   KNEE ARTHROSCOPY Right 2010   KNEE ARTHROSCOPY WITH SUBCHONDROPLASTY Left 01/09/2020   Procedure: Left knee arthroscopy,partial medial meniscectomy,  debridement, subchondroplasty left proximal tibia;  Surgeon: Susa Day, MD;  Location: [name];  Service: Orthopedics;  Laterality: Left;   LEFT HEART CATH AND CORONARY ANGIOGRAPHY N/A 02/19/2017   Procedure: LEFT HEART CATH AND CORONARY ANGIOGRAPHY;  Surgeon: [name], Peter M, MD;  Location: [facility] CV LAB;  Service: Cardiovascular;  Laterality: N/A;   TONSILLECTOMY     TUBAL LIGATION     VIDEO BRONCHOSCOPY WITH ENDOBRONCHIAL ULTRASOUND N/A 01/15/2022   Procedure: VIDEO BRONCHOSCOPY WITH ENDOBRONCHIAL ULTRASOUND;  Surgeon: [name], MD;  Location[facility] ENDOSCOPY;  Service: Pulmonary;  Laterality: N/A;   VIDEO BRONCHOSCOPY WITH RADIAL ENDOBRONCHIAL ULTRASOUND  01/15/2022   Procedure: RADIAL ENDOBRONCHIAL ULTRASOUND;  Surgeon: [name], MD;  Location[facility];  Service: Pulmonary;;    REVIEW OF SYSTEMS:   Review of Systems  Constitutional: Positive for few pound weight loss. Negative for appetite change, chills, and fever.  HENT: Negative for mouth sores, nosebleeds, sore throat and trouble swallowing.   Eyes: Negative for eye problems and  icterus.  Respiratory: Negative for cough, hemoptysis, shortness of breath and wheezing.   Cardiovascular: Negative for chest pain and leg swelling.  Gastrointestinal: Positive for nausea/vomiting a few days following treatment. Positive for mild constipation. Negative for abdominal pain and diarrhea.  Genitourinary: Negative for bladder incontinence, difficulty urinating, dysuria, frequency and hematuria.   Musculoskeletal: Negative for back pain, gait problem, neck pain and neck stiffness.  Skin: Negative for itching and rash.  Neurological: Negative for dizziness, extremity weakness, gait problem, headaches, light-headedness and seizures.  Hematological: Negative for adenopathy. Does not bruise/bleed easily.  Psychiatric/Behavioral: Negative for confusion, depression and sleep disturbance. The patient is not nervous/anxious.     PHYSICAL EXAMINATION:  Blood pressure (!) 167/89, pulse 66, temperature 98.7 F (37.1 C), temperature source Oral, resp. rate 16, weight 189 lb 9.6 oz (86 kg), SpO2 98 %.  ECOG PERFORMANCE STATUS: 1  Physical Exam  Constitutional: Oriented to person, place, and time and well-developed, well-nourished, and in no distress. HENT:  Head: Normocephalic and atraumatic.  Mouth/Throat: Oropharynx is clear and moist. No oropharyngeal exudate.  Eyes: Conjunctivae are normal. Right eye exhibits no discharge. Left eye exhibits no discharge. No scleral icterus.  Neck: Normal range of motion. Neck supple.  Cardiovascular: Normal rate, regular rhythm, normal heart sounds and intact distal pulses.   Pulmonary/Chest: Effort normal and breath sounds normal. No respiratory distress. No wheezes. No rales.  Abdominal: Soft. Bowel sounds are normal. Exhibits no distension and no mass. There is no tenderness.  Musculoskeletal: Normal range of motion. Exhibits no edema.  Lymphadenopathy:    No cervical adenopathy.  Neurological: Alert and oriented to person, place, and time.  Exhibits normal muscle tone. Gait normal. Coordination normal. Ambulates with a cane.  Skin: Skin is warm and dry. No rash noted. Not diaphoretic. No erythema. No pallor.  Psychiatric: Mood, memory and judgment normal.  Vitals reviewed.  LABORATORY DATA: Lab Results  Component Value Date   WBC 7.4 03/22/2022   HGB 13.9 03/22/2022   HCT 40.0 03/22/2022   MCV 89.3 03/22/2022   PLT 286 03/22/2022      Chemistry      Component Value Date/Time   NA 138 03/22/2022 1325   NA 143 12/08/2020 1620   K 4.0 03/22/2022 1325   CL 104 03/22/2022 1325   CO2 26 03/22/2022 1325   BUN 10 03/22/2022 1325   BUN 11 12/08/2020 1620   CREATININE 0.68 03/22/2022 1325  Component Value Date/Time   CALCIUM 9.4 03/22/2022 1325   ALKPHOS 91 03/22/2022 1325   AST 21 03/22/2022 1325   ALT 22 03/22/2022 1325   BILITOT 0.4 03/22/2022 1325       RADIOGRAPHIC STUDIES:  No results found.   ASSESSMENT/PLAN:  Is a very pleasant 71 year old Caucasian female diagnosed with stage IV (T1c, N2, M1 C) non-small cell lung cancer, adenocarcinoma.  The patient presented with a left upper lobe lung mass in addition to left hilar mediastinal lymphadenopathy as well as innumerable brain metastases.  She was diagnosed in September 2023.  She is positive for K-ras G12 C mutation which can be targeted in the second line setting. Her PDL1 expression is 93%.  She underwent SRS to the multiple brain lesions on 02/24/2022.  She is currently undergoing palliative systemic chemotherapy with carboplatin for AUC of 5, Alimta 500 mg per metered square, Keytruda 20 mg IV every 3 weeks.  She is status post 1 cycle and tolerated it well except for nausea/vomiting.    Labs were reviewed.  Recommend that she proceed with cycle #2 today scheduled.  We reviewed anti-emetic education. She is going to take compazine upon returning home. She is also going to try to take it in the AM the next few days. If she needs additional  anti-emetics for improved control of her nausea, I have sent her zofran to take once every 8 hours PRN for nausea starting on day 3 after chemo. Advised to alternate between zofran and compazine.   We will see her back for follow-up visit in 3 weeks for evaluation repeat blood work before undergoing cycle #3.  We reviewed constipation education   The patient was advised to call immediately if she has any concerning symptoms in the interval. The patient voices understanding of current disease status and treatment options and is in agreement with the current care plan. All questions were answered. The patient knows to call the clinic with any problems, questions or concerns. We can certainly see the patient much sooner if necessary       Orders Placed This Encounter  Procedures   CBC with Differential ([facility] Only)    Standing Status:   Standing    Number of Occurrences:   6    Standing Expiration Date:   03/23/2023   CMP ([facility] only)    Standing Status:   Standing    Number of Occurrences:   6    Standing Expiration Date:   03/23/2023      The total time spent in the appointment was 20-29 minutes.   Ana Woodroof L Akshar Starnes, PA-C 03/22/22

## 2022-03-19 MED FILL — Dexamethasone Sodium Phosphate Inj 100 MG/10ML: INTRAMUSCULAR | Qty: 1 | Status: AC

## 2022-03-19 MED FILL — Fosaprepitant Dimeglumine For IV Infusion 150 MG (Base Eq): INTRAVENOUS | Qty: 5 | Status: AC

## 2022-03-19 NOTE — Progress Notes (Signed)
Kimberly Huang presents today for follow up after completing [place] radiation therapy. She completed treatment on 02-24-22.   Recent neurologic symptoms, if any:  Seizures: none,  Headaches: none Nausea: none Dizziness/ataxia: none Difficulty with hand coordination: problems with depth perception Focal numbness/weakness: none Visual deficits/changes: left eye feels need to blink often (especially with light) Confusion/Memory deficits: none Pt reports having hallucinations (mild floating flowers).  Was on steroids in hospital. They are now better.   Other issues of note: Monday night had to spend the night due to r/o stroke issues related to findings while receiving chemo. The work up was negative for stroke. Per pt they think it was coming from inflammation due to brain lesions.   Vitals:   03/31/22 1134  BP: (!) 147/73  Pulse: 65  Resp: 18  Temp: 98.4 F (36.9 C)  SpO2: 99%

## 2022-03-22 ENCOUNTER — Inpatient Hospital Stay: Payer: Medicare Other

## 2022-03-22 ENCOUNTER — Inpatient Hospital Stay: Payer: Medicare Other | Attending: Radiation Oncology

## 2022-03-22 ENCOUNTER — Other Ambulatory Visit: Payer: Self-pay

## 2022-03-22 ENCOUNTER — Inpatient Hospital Stay (HOSPITAL_BASED_OUTPATIENT_CLINIC_OR_DEPARTMENT_OTHER): Payer: Medicare Other | Admitting: Physician Assistant

## 2022-03-22 ENCOUNTER — Emergency Department (HOSPITAL_COMMUNITY): Payer: Medicare Other

## 2022-03-22 ENCOUNTER — Ambulatory Visit (HOSPITAL_BASED_OUTPATIENT_CLINIC_OR_DEPARTMENT_OTHER): Payer: Medicare Other | Admitting: Physician Assistant

## 2022-03-22 ENCOUNTER — Encounter (HOSPITAL_COMMUNITY): Payer: Self-pay

## 2022-03-22 ENCOUNTER — Observation Stay (HOSPITAL_COMMUNITY)
Admission: EM | Admit: 2022-03-22 | Discharge: 2022-03-23 | Disposition: A | Discharge status: Home / Self Care | Payer: Medicare Other | Attending: Internal Medicine | Admitting: Internal Medicine

## 2022-03-22 VITALS — BP 174/77 | HR 73 | Temp 98.0°F | Resp 18

## 2022-03-22 VITALS — BP 167/89 | HR 66 | Temp 98.7°F | Resp 16 | Wt 189.6 lb

## 2022-03-22 DIAGNOSIS — E782 Mixed hyperlipidemia: Secondary | ICD-10-CM | POA: Diagnosis not present

## 2022-03-22 DIAGNOSIS — C3492 Malignant neoplasm of unspecified part of left bronchus or lung: Secondary | ICD-10-CM | POA: Diagnosis not present

## 2022-03-22 DIAGNOSIS — I2510 Atherosclerotic heart disease of native coronary artery without angina pectoris: Secondary | ICD-10-CM | POA: Diagnosis present

## 2022-03-22 DIAGNOSIS — Z79899 Other long term (current) drug therapy: Secondary | ICD-10-CM | POA: Diagnosis not present

## 2022-03-22 DIAGNOSIS — I10 Essential (primary) hypertension: Secondary | ICD-10-CM | POA: Diagnosis present

## 2022-03-22 DIAGNOSIS — Z955 Presence of coronary angioplasty implant and graft: Secondary | ICD-10-CM | POA: Insufficient documentation

## 2022-03-22 DIAGNOSIS — R4781 Slurred speech: Principal | ICD-10-CM | POA: Insufficient documentation

## 2022-03-22 DIAGNOSIS — I252 Old myocardial infarction: Secondary | ICD-10-CM | POA: Insufficient documentation

## 2022-03-22 DIAGNOSIS — R29818 Other symptoms and signs involving the nervous system: Secondary | ICD-10-CM | POA: Diagnosis not present

## 2022-03-22 DIAGNOSIS — Z5111 Encounter for antineoplastic chemotherapy: Secondary | ICD-10-CM | POA: Insufficient documentation

## 2022-03-22 DIAGNOSIS — C7931 Secondary malignant neoplasm of brain: Secondary | ICD-10-CM

## 2022-03-22 DIAGNOSIS — Z7982 Long term (current) use of aspirin: Secondary | ICD-10-CM | POA: Diagnosis not present

## 2022-03-22 DIAGNOSIS — Z5112 Encounter for antineoplastic immunotherapy: Secondary | ICD-10-CM | POA: Diagnosis not present

## 2022-03-22 DIAGNOSIS — R29810 Facial weakness: Secondary | ICD-10-CM | POA: Diagnosis not present

## 2022-03-22 DIAGNOSIS — M6281 Muscle weakness (generalized): Secondary | ICD-10-CM

## 2022-03-22 DIAGNOSIS — R4189 Other symptoms and signs involving cognitive functions and awareness: Secondary | ICD-10-CM | POA: Insufficient documentation

## 2022-03-22 DIAGNOSIS — R4789 Other speech disturbances: Secondary | ICD-10-CM | POA: Diagnosis present

## 2022-03-22 DIAGNOSIS — D496 Neoplasm of unspecified behavior of brain: Secondary | ICD-10-CM | POA: Diagnosis not present

## 2022-03-22 DIAGNOSIS — G936 Cerebral edema: Secondary | ICD-10-CM | POA: Diagnosis not present

## 2022-03-22 DIAGNOSIS — F17210 Nicotine dependence, cigarettes, uncomplicated: Secondary | ICD-10-CM | POA: Diagnosis present

## 2022-03-22 DIAGNOSIS — E785 Hyperlipidemia, unspecified: Secondary | ICD-10-CM | POA: Diagnosis present

## 2022-03-22 DIAGNOSIS — G9389 Other specified disorders of brain: Secondary | ICD-10-CM

## 2022-03-22 DIAGNOSIS — Z720 Tobacco use: Secondary | ICD-10-CM | POA: Diagnosis present

## 2022-03-22 DIAGNOSIS — C3412 Malignant neoplasm of upper lobe, left bronchus or lung: Secondary | ICD-10-CM | POA: Insufficient documentation

## 2022-03-22 LAB — I-STAT CHEM 8, ED
BUN: 9 mg/dL (ref 8–23)
Calcium, Ion: 1.1 mmol/L — ABNORMAL LOW (ref 1.15–1.40)
Chloride: 104 mmol/L (ref 98–111)
Creatinine, Ser: 0.6 mg/dL (ref 0.44–1.00)
Glucose, Bld: 148 mg/dL — ABNORMAL HIGH (ref 70–99)
HCT: 40 % (ref 36.0–46.0)
Hemoglobin: 13.6 g/dL (ref 12.0–15.0)
Potassium: 3.8 mmol/L (ref 3.5–5.1)
Sodium: 138 mmol/L (ref 135–145)
TCO2: 23 mmol/L (ref 22–32)

## 2022-03-22 LAB — COMPREHENSIVE METABOLIC PANEL
ALT: 26 U/L (ref 0–44)
AST: 27 U/L (ref 15–41)
Albumin: 3.7 g/dL (ref 3.5–5.0)
Alkaline Phosphatase: 81 U/L (ref 38–126)
Anion gap: 7 (ref 5–15)
BUN: 11 mg/dL (ref 8–23)
CO2: 25 mmol/L (ref 22–32)
Calcium: 8.4 mg/dL — ABNORMAL LOW (ref 8.9–10.3)
Chloride: 105 mmol/L (ref 98–111)
Creatinine, Ser: 0.74 mg/dL (ref 0.44–1.00)
GFR, Estimated: >60 mL/min (ref >60)
Glucose, Bld: 152 mg/dL — ABNORMAL HIGH (ref 70–99)
Potassium: 3.7 mmol/L (ref 3.5–5.1)
Sodium: 137 mmol/L (ref 135–145)
Total Bilirubin: 0.7 mg/dL (ref 0.3–1.2)
Total Protein: 7.6 g/dL (ref 6.5–8.1)

## 2022-03-22 LAB — CMP (CANCER CENTER ONLY)
ALT: 22 U/L (ref 0–44)
AST: 21 U/L (ref 15–41)
Albumin: 4.2 g/dL (ref 3.5–5.0)
Alkaline Phosphatase: 91 U/L (ref 38–126)
Anion gap: 8 (ref 5–15)
BUN: 10 mg/dL (ref 8–23)
CO2: 26 mmol/L (ref 22–32)
Calcium: 9.4 mg/dL (ref 8.9–10.3)
Chloride: 104 mmol/L (ref 98–111)
Creatinine: 0.68 mg/dL (ref 0.44–1.00)
GFR, Estimated: >60 mL/min (ref >60)
Glucose, Bld: 144 mg/dL — ABNORMAL HIGH (ref 70–99)
Potassium: 4 mmol/L (ref 3.5–5.1)
Sodium: 138 mmol/L (ref 135–145)
Total Bilirubin: 0.4 mg/dL (ref 0.3–1.2)
Total Protein: 7.6 g/dL (ref 6.5–8.1)

## 2022-03-22 LAB — CBC WITH DIFFERENTIAL (CANCER CENTER ONLY)
Abs Immature Granulocytes: 0.03 10*3/uL (ref 0.00–0.07)
Basophils Absolute: 0 10*3/uL (ref 0.0–0.1)
Basophils Relative: 1 %
Eosinophils Absolute: 0.1 10*3/uL (ref 0.0–0.5)
Eosinophils Relative: 1 %
HCT: 40 % (ref 36.0–46.0)
Hemoglobin: 13.9 g/dL (ref 12.0–15.0)
Immature Granulocytes: 0 %
Lymphocytes Relative: 22 %
Lymphs Abs: 1.6 10*3/uL (ref 0.7–4.0)
MCH: 31 pg (ref 26.0–34.0)
MCHC: 34.8 g/dL (ref 30.0–36.0)
MCV: 89.3 fL (ref 80.0–100.0)
Monocytes Absolute: 0.6 10*3/uL (ref 0.1–1.0)
Monocytes Relative: 8 %
Neutro Abs: 5.1 10*3/uL (ref 1.7–7.7)
Neutrophils Relative %: 68 %
Platelet Count: 286 10*3/uL (ref 150–400)
RBC: 4.48 MIL/uL (ref 3.87–5.11)
RDW: 13.5 % (ref 11.5–15.5)
WBC Count: 7.4 10*3/uL (ref 4.0–10.5)
nRBC: 0 % (ref 0.0–0.2)

## 2022-03-22 LAB — DIFFERENTIAL
Abs Immature Granulocytes: 0.04 10*3/uL (ref 0.00–0.07)
Basophils Absolute: 0 10*3/uL (ref 0.0–0.1)
Basophils Relative: 1 %
Eosinophils Absolute: 0.1 10*3/uL (ref 0.0–0.5)
Eosinophils Relative: 1 %
Immature Granulocytes: 1 %
Lymphocytes Relative: 21 %
Lymphs Abs: 1.4 10*3/uL (ref 0.7–4.0)
Monocytes Absolute: 0.3 10*3/uL (ref 0.1–1.0)
Monocytes Relative: 4 %
Neutro Abs: 5 10*3/uL (ref 1.7–7.7)
Neutrophils Relative %: 72 %

## 2022-03-22 LAB — CBC
HCT: 40.1 % (ref 36.0–46.0)
Hemoglobin: 13.3 g/dL (ref 12.0–15.0)
MCH: 30.7 pg (ref 26.0–34.0)
MCHC: 33.2 g/dL (ref 30.0–36.0)
MCV: 92.6 fL (ref 80.0–100.0)
Platelets: 285 10*3/uL (ref 150–400)
RBC: 4.33 MIL/uL (ref 3.87–5.11)
RDW: 13.5 % (ref 11.5–15.5)
WBC: 6.9 10*3/uL (ref 4.0–10.5)
nRBC: 0 % (ref 0.0–0.2)

## 2022-03-22 LAB — PROTIME-INR
INR: 1 (ref 0.8–1.2)
Prothrombin Time: 12.8 seconds (ref 11.4–15.2)

## 2022-03-22 LAB — APTT: aPTT: 32 seconds (ref 24–36)

## 2022-03-22 LAB — ETHANOL: Alcohol, Ethyl (B): <10 mg/dL (ref <10)

## 2022-03-22 MED ORDER — SODIUM CHLORIDE 0.9 % IV SOLN
468.5000 mg | Freq: Once | INTRAVENOUS | Status: DC
  Filled 2022-03-22: qty 47

## 2022-03-22 MED ORDER — DEXAMETHASONE SODIUM PHOSPHATE 4 MG/ML IJ SOLN
4.0000 mg | Freq: Four times a day (QID) | INTRAMUSCULAR | Status: DC
  Administered 2022-03-23 (×2): 4 mg via INTRAVENOUS
  Filled 2022-03-22 (×2): qty 1

## 2022-03-22 MED ORDER — MORPHINE SULFATE (PF) 2 MG/ML IV SOLN: 2.0000 mg | INTRAVENOUS | Status: DC | PRN

## 2022-03-22 MED ORDER — PALONOSETRON HCL INJECTION 0.25 MG/5ML
0.2500 mg | Freq: Once | INTRAVENOUS | Status: AC
  Administered 2022-03-22: 0.25 mg via INTRAVENOUS
  Filled 2022-03-22: qty 5

## 2022-03-22 MED ORDER — SODIUM CHLORIDE 0.9% FLUSH: 3.0000 mL | Freq: Once | INTRAVENOUS | Status: DC

## 2022-03-22 MED ORDER — GADOBUTROL 1 MMOL/ML IV SOLN
8.5000 mL | Freq: Once | INTRAVENOUS | Status: AC | PRN
  Administered 2022-03-22: 8.5 mL via INTRAVENOUS

## 2022-03-22 MED ORDER — PROCHLORPERAZINE MALEATE 10 MG PO TABS: 10.0000 mg | ORAL_TABLET | Freq: Four times a day (QID) | ORAL | 2 refills | Status: DC | PRN

## 2022-03-22 MED ORDER — DEXAMETHASONE SODIUM PHOSPHATE 10 MG/ML IJ SOLN
10.0000 mg | Freq: Once | INTRAMUSCULAR | Status: AC
  Administered 2022-03-22: 10 mg via INTRAVENOUS
  Filled 2022-03-22: qty 1

## 2022-03-22 MED ORDER — SODIUM CHLORIDE 0.9 % IV SOLN
150.0000 mg | Freq: Once | INTRAVENOUS | Status: AC
  Administered 2022-03-22: 150 mg via INTRAVENOUS
  Filled 2022-03-22: qty 150

## 2022-03-22 MED ORDER — SODIUM CHLORIDE 0.9 % IV SOLN
500.0000 mg/m2 | Freq: Once | INTRAVENOUS | Status: AC
  Administered 2022-03-22: 1000 mg via INTRAVENOUS
  Filled 2022-03-22: qty 40

## 2022-03-22 MED ORDER — SODIUM CHLORIDE 0.9 % IV SOLN
200.0000 mg | Freq: Once | INTRAVENOUS | Status: AC
  Administered 2022-03-22: 200 mg via INTRAVENOUS
  Filled 2022-03-22: qty 200

## 2022-03-22 MED ORDER — SODIUM CHLORIDE 0.9 % IV SOLN
10.0000 mg | Freq: Once | INTRAVENOUS | Status: AC
  Administered 2022-03-22: 10 mg via INTRAVENOUS
  Filled 2022-03-22: qty 10

## 2022-03-22 MED ORDER — ONDANSETRON HCL 4 MG PO TABS: 4.0000 mg | ORAL_TABLET | Freq: Four times a day (QID) | ORAL | Status: DC | PRN

## 2022-03-22 MED ORDER — ONDANSETRON HCL 4 MG/2ML IJ SOLN: 4.0000 mg | Freq: Four times a day (QID) | INTRAMUSCULAR | Status: DC | PRN

## 2022-03-22 MED ORDER — IOHEXOL 350 MG/ML SOLN
75.0000 mL | Freq: Once | INTRAVENOUS | Status: AC | PRN
  Administered 2022-03-22: 75 mL via INTRAVENOUS

## 2022-03-22 MED ORDER — ONDANSETRON HCL 8 MG PO TABS: 8.0000 mg | ORAL_TABLET | Freq: Three times a day (TID) | ORAL | 2 refills | Status: DC | PRN

## 2022-03-22 MED ORDER — SODIUM CHLORIDE 0.45 % IV SOLN: INTRAVENOUS | Status: DC

## 2022-03-22 MED ORDER — ENOXAPARIN SODIUM 40 MG/0.4ML IJ SOSY
40.0000 mg | PREFILLED_SYRINGE | INTRAMUSCULAR | Status: DC
  Administered 2022-03-23: 40 mg via SUBCUTANEOUS
  Filled 2022-03-22: qty 0.4

## 2022-03-22 MED ORDER — SODIUM CHLORIDE 0.9 % IV SOLN: Freq: Once | INTRAVENOUS | Status: AC

## 2022-03-22 NOTE — Patient Instructions (Addendum)
[facility] ONCOLOGY  Discharge Instructions: Thank you for choosing [facility] to provide your oncology and hematology care.   If you have a lab appointment with the [facility], please go directly to the [facility] and check in at the registration area.   Wear comfortable clothing and clothing appropriate for easy access to any Portacath or PICC line.   We strive to give you quality time with your provider. You may need to reschedule your appointment if you arrive late (15 or more minutes).  Arriving late affects you and other patients whose appointments are after yours.  Also, if you miss three or more appointments without notifying the office, you may be dismissed from the clinic at the provider's discretion.      For prescription refill requests, have your pharmacy contact our office and allow 72 hours for refills to be completed.    Today you received the following chemotherapy and/or immunotherapy agents: Keytruda/Alimta   To help prevent nausea and vomiting after your treatment, we encourage you to take your nausea medication as directed.  BELOW ARE SYMPTOMS THAT SHOULD BE REPORTED IMMEDIATELY: *FEVER GREATER THAN 100.4 F (38 C) OR HIGHER *CHILLS OR SWEATING *NAUSEA AND VOMITING THAT IS NOT CONTROLLED WITH YOUR NAUSEA MEDICATION *UNUSUAL SHORTNESS OF BREATH *UNUSUAL BRUISING OR BLEEDING *URINARY PROBLEMS (pain or burning when urinating, or frequent urination) *BOWEL PROBLEMS (unusual diarrhea, constipation, pain near the anus) TENDERNESS IN MOUTH AND THROAT WITH OR WITHOUT PRESENCE OF ULCERS (sore throat, sores in mouth, or a toothache) UNUSUAL RASH, SWELLING OR PAIN  UNUSUAL VAGINAL DISCHARGE OR ITCHING   Items with * indicate a potential emergency and should be followed up as soon as possible or go to the Emergency Department if any problems should occur.  Please show the CHEMOTHERAPY ALERT CARD or IMMUNOTHERAPY ALERT CARD at check-in  to the Emergency Department and triage nurse.  Should you have questions after your visit or need to cancel or reschedule your appointment, please contact [facility]  Dept: [phone]  and follow the prompts.  Office hours are 8:00 a.m. to 4:30 p.m. Monday - Friday. Please note that voicemails left after 4:00 p.m. may not be returned until the following business day.  We are closed weekends and major holidays. You have access to a nurse at all times for urgent questions. Please call the main number to the clinic Dept: [phone] and follow the prompts.   For any non-urgent questions, you may also contact your provider using MyChart. We now offer e-Visits for anyone 71 and older to request care online for non-urgent symptoms. For details visit [email or web address].   Also download the MyChart app! Go to the app store, search "MyChart", open the app, select [facility], and log in with your MyChart username and password.  Masks are optional in the cancer centers. If you would like for your care team to wear a mask while they are taking care of you, please let them know. You may have one support person who is at least 73 years old accompany you for your appointments.

## 2022-03-22 NOTE — ED Notes (Signed)
Pt remains at MRI, vitals will be updated upon return

## 2022-03-22 NOTE — ED Notes (Signed)
ED TO INPATIENT HANDOFF REPORT  Name/Age/Gender Kimberly Huang 71 y.o. female  Code Status Code Status History     Date Active Date Inactive Code Status Order ID Comments User Context   02/19/2017 1745 02/22/2017 2019 Full Code [id number]  [name], Peter M, MD Inpatient   02/19/2017 1730 02/19/2017 1745 Full Code [id number]  [name], Peter M, MD Inpatient       Home/SNF/Other Home  Chief Complaint Brain tumor [facility]) [D49.6]  Level of Care/Admitting Diagnosis ED Disposition     ED Disposition  Admit   Condition  --   Comment  Hospital Area: [facility] [[id number]]  Level of Care: Med-Surg [16]  May place patient in observation at [facility] or [place] if equivalent level of care is available:: No  Covid Evaluation: Asymptomatic - no recent exposure (last 10 days) testing not required  Diagnosis: Brain tumor [facility]) [[id number]]  Admitting Physician: [place], [place]  Attending Physician: Elwyn Reach [[id number]]          Medical History Past Medical History:  Diagnosis Date   Arthritis    knee, hands   CAD S/P PCI OM1 99%-0%; Plan Staged PCI mRCA 90%. 02/19/2017   1. Severe 2 vessel obstructive CAD    - 99% thrombotic occlusion of first OM. This is a bifurcating vessel.     - 90% mid RCA-segmental 2. Good overall LV dysfunction with lateral HK 3. Moderately elevated LVEDP 4. Successful stenting of the first OM with DES. Distal embolization into the distal lateral OM branch.   Plan: DAPT for one year. Will treat with IV Aggrastat for 18 hours. Start high dose statin, beta blocker. IV Ntg for BP control acutely. Plan for stage PCI of RCA on Monday 84/69 if no complication.   Essential hypertension 02/21/2017   Hyperlipidemia with target LDL less than 70 02/21/2017   Lung cancer ([place])    Presence of drug-eluting stent in L Cx: OM1 (Xience SIERRA DES 3.5 x 15) 02/21/2017   STEMI involving left circumflex coronary artery ([place]) 02/19/2017   Tobacco  abuse 02/19/2017    Allergies Allergies  Allergen Reactions   Codeine Nausea Only    IV Location/Drains/Wounds Patient Lines/Drains/Airways Status     Active Line/Drains/Airways     Name Placement date Placement time Site Days   Peripheral IV 03/22/22 22 G 1" Anterior;Distal;Left Forearm 03/22/22  1505  Forearm  less than 1   Peripheral IV 03/22/22 18 G Left Antecubital 03/22/22  1659  Antecubital  less than 1   External Urinary Catheter 03/22/22  1937  --  less than 1   Incision (Closed) 01/09/20 Knee Left 01/09/20  1021  -- 803   Wound / Incision (Open or Dehisced) 02/19/17 Other (Comment) Abdomen Left mole under left breast 02/19/17  1716  Abdomen  1857            Labs/Imaging Results for orders placed or performed during the hospital encounter of 03/22/22 (from the past 48 hour(s))  I-stat chem 8, ED     Status: Abnormal   Collection Time: 03/22/22  5:04 PM  Result Value Ref Range   Sodium 138 135 - 145 mmol/L   Potassium 3.8 3.5 - 5.1 mmol/L   Chloride 104 98 - 111 mmol/L   BUN 9 8 - 23 mg/dL   Creatinine, Ser 0.60 0.44 - 1.00 mg/dL   Glucose, Bld 148 (H) 70 - 99 mg/dL    Comment: Glucose reference range applies only  to samples taken after fasting for at least 8 hours.   Calcium, Ion 1.10 (L) 1.15 - 1.40 mmol/L   TCO2 23 22 - 32 mmol/L   Hemoglobin 13.6 12.0 - 15.0 g/dL   HCT 40.0 36.0 - 46.0 %  Protime-INR     Status: None   Collection Time: 03/22/22  5:08 PM  Result Value Ref Range   Prothrombin Time 12.8 11.4 - 15.2 seconds   INR 1.0 0.8 - 1.2    Comment: (NOTE) INR goal varies based on device and disease states. Performed at [facility], [place] [address], [place], [place] [address]   APTT     Status: None   Collection Time: 03/22/22  5:08 PM  Result Value Ref Range   aPTT 32 24 - 36 seconds    Comment: Performed at [facility], [place] [address] [place], [place] [address]  CBC     Status: None   Collection Time:  03/22/22  5:08 PM  Result Value Ref Range   WBC 6.9 4.0 - 10.5 K/uL   RBC 4.33 3.87 - 5.11 MIL/uL   Hemoglobin 13.3 12.0 - 15.0 g/dL   HCT 40.1 36.0 - 46.0 %   MCV 92.6 80.0 - 100.0 fL   MCH 30.7 26.0 - 34.0 pg   MCHC 33.2 30.0 - 36.0 g/dL   RDW 13.5 11.5 - 15.5 %   Platelets 285 150 - 400 K/uL   nRBC 0.0 0.0 - 0.2 %    Comment: Performed at [facility], [place] [address] [place], [place] [address]  Differential     Status: None   Collection Time: 03/22/22  5:08 PM  Result Value Ref Range   Neutrophils Relative % 72 %   Neutro Abs 5.0 1.7 - 7.7 K/uL   Lymphocytes Relative 21 %   Lymphs Abs 1.4 0.7 - 4.0 K/uL   Monocytes Relative 4 %   Monocytes Absolute 0.3 0.1 - 1.0 K/uL   Eosinophils Relative 1 %   Eosinophils Absolute 0.1 0.0 - 0.5 K/uL   Basophils Relative 1 %   Basophils Absolute 0.0 0.0 - 0.1 K/uL   Immature Granulocytes 1 %   Abs Immature Granulocytes 0.04 0.00 - 0.07 K/uL    Comment: Performed at [facility], [place] [address], [place], [place] [address]  Comprehensive metabolic panel     Status: Abnormal   Collection Time: 03/22/22  5:08 PM  Result Value Ref Range   Sodium 137 135 - 145 mmol/L   Potassium 3.7 3.5 - 5.1 mmol/L   Chloride 105 98 - 111 mmol/L   CO2 25 22 - 32 mmol/L   Glucose, Bld 152 (H) 70 - 99 mg/dL    Comment: Glucose reference range applies only to samples taken after fasting for at least 8 hours.   BUN 11 8 - 23 mg/dL   Creatinine, Ser 0.74 0.44 - 1.00 mg/dL   Calcium 8.4 (L) 8.9 - 10.3 mg/dL   Total Protein 7.6 6.5 - 8.1 g/dL   Albumin 3.7 3.5 - 5.0 g/dL   AST 27 15 - 41 U/L   ALT 26 0 - 44 U/L   Alkaline Phosphatase 81 38 - 126 U/L   Total Bilirubin 0.7 0.3 - 1.2 mg/dL   GFR, Estimated >60 >60 mL/min    Comment: (NOTE) Calculated using the CKD-EPI Creatinine Equation (2021)    Anion gap 7 5 - 15    Comment: Performed at [facility]  [place] [address], [place], [place] [address]    MR BRAIN W WO CONTRAST  Result Date: 03/22/2022 CLINICAL DATA:  Lung cancer, metastasis to brain, stroke suspected EXAM: MRI HEAD WITHOUT AND WITH CONTRAST TECHNIQUE: Multiplanar, multiecho pulse sequences of the brain and surrounding structures were obtained without and with intravenous contrast. CONTRAST:  8.31mL GADAVIST GADOBUTROL 1 MMOL/ML IV SOLN COMPARISON:  02/05/2022 FINDINGS: Brain: No restricted diffusion to suggest acute or subacute infarct. Again noted are multiple enhancing lesions, as follows: Left frontal lobe, 11 mm, previously 9 mm, 16:111 Left parietal lobe, 10 mm, previously 9 mm, 16:108 Right occipital lobe, 16 mm, previously 13 mm, 16:83 Left posterior frontal lobe, 3 mm, previously 4 mm, 16:139 Left frontal lobe, 5 mm, previously 5 mm, 16:124 Right posterior frontal lobe,  8 mm, previously 8 mm,16:121 Medial right parietal lobe, 5 mm, previously 5 mm, 16:112 Lateral right occipital lobe, 5 mm, previously 5 mm, 16:63 Medial right occipital lobe, 4 mm, previously 4 mm, 16:62 Left cerebellum, 13 mm, previously 13 mm, 16:41 Right cerebellum, 4 mm, previously 4 mm, 16:41 Left cerebellum, 4 mm, previously 4 mm, 16:32 Increased surrounding edema is associated with the larger lesions. Several punctate lesions noted on the prior exam are not definitively seen on the current study. No significant hemorrhage. No definite hemosiderin deposition associated with the dominant right occipital lesion. No significant mass effect or midline shift. No hydrocephalus or extra-axial collection. Vascular: Normal arterial flow voids. Normal arterial and venous enhancement. Skull and upper cervical spine: Normal marrow signal. Sinuses/Orbits: Clear paranasal sinuses. The orbits are unremarkable. Other: The mastoids are well aerated. IMPRESSION: 1. Slight interval increase in size of several previously noted metastatic lesions, as described above, with increased surrounding edema. No significant mass effect or  midline shift. 2. Several punctate lesions noted on the prior exam are not definitively seen on the current study. 3. No evidence of acute or subacute infarct. Electronically Signed   By: Merilyn Baba M.D.   On: 03/22/2022 19:40   CT ANGIO HEAD CODE STROKE  Result Date: 03/22/2022 CLINICAL DATA:  Neuro deficit, acute, stroke suspected EXAM: CT ANGIOGRAPHY HEAD AND NECK TECHNIQUE: Multidetector CT imaging of the head and neck was performed using the standard protocol during bolus administration of intravenous contrast. Multiplanar CT image reconstructions and MIPs were obtained to evaluate the vascular anatomy. Carotid stenosis measurements (when applicable) are obtained utilizing NASCET criteria, using the distal internal carotid diameter as the denominator. RADIATION DOSE REDUCTION: This exam was performed according to the departmental dose-optimization program which includes automated exposure control, adjustment of the mA and/or kV according to patient size and/or use of iterative reconstruction technique. CONTRAST:  26mL OMNIPAQUE IOHEXOL 350 MG/ML SOLN COMPARISON:  CT head from the same day. FINDINGS: CTA NECK FINDINGS Aortic arch: Great vessel origins are patent. Right carotid system: No evidence of dissection, stenosis (50% or greater), or occlusion. Tortuous ICA. Left carotid system: No evidence of dissection, stenosis (50% or greater), or occlusion. Tortuous ICA. Vertebral arteries: Codominant. No evidence of dissection, stenosis (50% or greater), or occlusion. Skeleton: Multilevel degenerative change. Other neck: No acute findings. Upper chest: Partially imaged left upper lobe mass, better characterized on prior CT of the chest 01/08/2022. Review of the MIP images confirms the above findings CTA HEAD FINDINGS Anterior circulation: Bilateral intracranial ICAs, MCAs, and ACAs are patent without proximal high-grade stenosis. Approximately 2-3 Conical outpouching arising from the posterior/inferior  aspect of the left supraclinoid ICA is favored to  represent an infundibulum given small vessel near the tip, although a aneurysm is not excluded. Posterior circulation: Bilateral intradural vertebral arteries elbow basilar artery, and bilateral posterior cerebral arteries are patent without proximal high-grade stenosis. Venous sinuses: As permitted by contrast timing, patent. Review of the MIP images confirms the above findings Brain: No evidence of acute infarction, hemorrhage, hydrocephalus, extra-axial collection or mass lesion/mass effect. IMPRESSION: 1. No emergent large vessel occlusion or proximal high-grade stenosis. 2. Approximately 2-3 Conical outpouching arising from the posterior/inferior aspect of the left supraclinoid ICA is favored to represent an infundibulum given small vessel near the tip, although a aneurysm is not excluded. A follow-up CTA in 6-12 months could ensure stability if clinically warranted. Partially imaged left upper lobe mass, better characterized on prior CT of the chest 01/08/2022. Electronically Signed   By: Margaretha Sheffield M.D.   On: 03/22/2022 17:48   CT ANGIO NECK CODE STROKE  Result Date: 03/22/2022 CLINICAL DATA:  Neuro deficit, acute, stroke suspected EXAM: CT ANGIOGRAPHY HEAD AND NECK TECHNIQUE: Multidetector CT imaging of the head and neck was performed using the standard protocol during bolus administration of intravenous contrast. Multiplanar CT image reconstructions and MIPs were obtained to evaluate the vascular anatomy. Carotid stenosis measurements (when applicable) are obtained utilizing NASCET criteria, using the distal internal carotid diameter as the denominator. RADIATION DOSE REDUCTION: This exam was performed according to the departmental dose-optimization program which includes automated exposure control, adjustment of the mA and/or kV according to patient size and/or use of iterative reconstruction technique. CONTRAST:  66mL OMNIPAQUE IOHEXOL 350  MG/ML SOLN COMPARISON:  CT head from the same day. FINDINGS: CTA NECK FINDINGS Aortic arch: Great vessel origins are patent. Right carotid system: No evidence of dissection, stenosis (50% or greater), or occlusion. Tortuous ICA. Left carotid system: No evidence of dissection, stenosis (50% or greater), or occlusion. Tortuous ICA. Vertebral arteries: Codominant. No evidence of dissection, stenosis (50% or greater), or occlusion. Skeleton: Multilevel degenerative change. Other neck: No acute findings. Upper chest: Partially imaged left upper lobe mass, better characterized on prior CT of the chest 01/08/2022. Review of the MIP images confirms the above findings CTA HEAD FINDINGS Anterior circulation: Bilateral intracranial ICAs, MCAs, and ACAs are patent without proximal high-grade stenosis. Approximately 2-3 Conical outpouching arising from the posterior/inferior aspect of the left supraclinoid ICA is favored to represent an infundibulum given small vessel near the tip, although a aneurysm is not excluded. Posterior circulation: Bilateral intradural vertebral arteries elbow basilar artery, and bilateral posterior cerebral arteries are patent without proximal high-grade stenosis. Venous sinuses: As permitted by contrast timing, patent. Review of the MIP images confirms the above findings Brain: No evidence of acute infarction, hemorrhage, hydrocephalus, extra-axial collection or mass lesion/mass effect. IMPRESSION: 1. No emergent large vessel occlusion or proximal high-grade stenosis. 2. Approximately 2-3 Conical outpouching arising from the posterior/inferior aspect of the left supraclinoid ICA is favored to represent an infundibulum given small vessel near the tip, although a aneurysm is not excluded. A follow-up CTA in 6-12 months could ensure stability if clinically warranted. Partially imaged left upper lobe mass, better characterized on prior CT of the chest 01/08/2022. Electronically Signed   By: Margaretha Sheffield M.D.   On: 03/22/2022 17:48   CT HEAD CODE STROKE WO CONTRAST  Result Date: 03/22/2022 CLINICAL DATA:  Code stroke. EXAM: CT HEAD WITHOUT CONTRAST TECHNIQUE: Contiguous axial images were obtained from the base of the skull through the vertex without intravenous contrast. RADIATION DOSE REDUCTION: This exam  was performed according to the departmental dose-optimization program which includes automated exposure control, adjustment of the mA and/or kV according to patient size and/or use of iterative reconstruction technique. COMPARISON:  MRI February 05, 2022. FINDINGS: Brain: Multiple lesions throughout bilateral cerebral hemispheres, compatible with metastases better characterized on prior MRI. Small focus of hemorrhage in the region of a probable lesion in the right parieto-occipital region may represent interval hemorrhage of the metastasis. No definite evidence of acute large vascular territory infarct; however, the above metastases limited assessment. No midline shift. No hydrocephalus. Vascular: No hyperdense vessel identified. Skull: No acute fracture. Sinuses/Orbits: Clear sinuses.  No acute orbital findings. IMPRESSION: 1. Multiple lesions throughout bilateral cerebral hemispheres, compatible with metastases better characterized on prior MRI. Small focus of hemorrhage in the region of a probable right parieto-occipital metastatic lesion, possibly interval hemorrhage of the metastasis. MRI with contrast could better assess if clinically warranted. 2. No definite evidence of acute large vascular territory infarct; however, the above metastases limited assessment. MRI could further evaluate. Findings discussed with Dr. Maryan Rued via telephone at 5:19 p.m. Electronically Signed   By: Margaretha Sheffield M.D.   On: 03/22/2022 17:21    Pending Labs Unresulted Labs (From admission, onward)     Start     Ordered   03/22/22 1702  Ethanol  (Stroke Panel ([facility]))  Once,   URGENT        03/22/22 1701             Vitals/Pain Today's Vitals   03/22/22 1900 03/22/22 1915 03/22/22 1930 03/22/22 1945  BP: (!) 146/91 110/72 121/74 124/66  Pulse: 73 66 65 (!) 59  Resp: 19 14 15 18   Temp:      SpO2: 100% 100% 99% 98%  Weight:      Height:      PainSc:        Isolation Precautions No active isolations  Medications Medications  sodium chloride flush (NS) 0.9 % injection 3 mL (3 mLs Intravenous Not Given 03/22/22 1729)  dexamethasone (DECADRON) injection 4 mg (has no administration in time range)  iohexol (OMNIPAQUE) 350 MG/ML injection 75 mL (75 mLs Intravenous Contrast Given 03/22/22 1721)  gadobutrol (GADAVIST) 1 MMOL/ML injection 8.5 mL (8.5 mLs Intravenous Contrast Given 03/22/22 1838)  dexamethasone (DECADRON) injection 10 mg (10 mg Intravenous Given 03/22/22 1924)    Mobility walks with person assist

## 2022-03-22 NOTE — Progress Notes (Signed)
Called to infusion center at 1635 to see patient for garbled speech. 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 [name] and Dr. [name]. LNK per infusion RN is [phone].  Upon my evaluation patient has left sided facial droop, vision loss in left eye, and difficulty with finger to nose on the left. No weakness noted in grip strength. Patient had difficulty following commands.  She was hypertensive 174/77. Patient had so far received Keytruda and Alimta. This was her second treatment and reported no difficulty after first treatment. Patient taken to ED by myself and RN. ED staff was waiting at the bedside on her arrival. Patient handed off to ED MD.

## 2022-03-22 NOTE — Consult Note (Addendum)
NEUROLOGY TELECONSULTATION NOTE   Date of service: March 22, 2022 Patient Name: Kimberly Huang MRN:  [id number] DOB:  1951/02/05 Reason for consult: "vision loss"  Requesting Provider: Dr. Fredia Sorrow Consult Participants: myself, Rolm Gala. Angie tele. Location of the provider: [facility] Location of the patient: [facility]  This consult was provided via telemedicine with 2-way video and audio communication. The patient/family was informed that care would be provided in this way and agreed to receive care in this manner.   _ _ _   _ __   _ __ _ _  __ __   _ __   __ _  History of Present Illness   Kimberly Huang is a 71 y.o. female with PMH significant for  has a past medical history of Arthritis, CAD S/P PCI OM1 99%-0%; Plan Staged PCI mRCA 90%. (02/19/2017), Essential hypertension (02/21/2017), Hyperlipidemia with target LDL less than 70 (02/21/2017), Lung cancer ([place]), Presence of drug-eluting stent in L Cx: OM1 (Xience SIERRA DES 3.5 x 15) (02/21/2017), STEMI involving left circumflex coronary artery ([place]) (02/19/2017), and Tobacco abuse (02/19/2017). who presents with abrupt onset of visual change and left facial droop undergoing chemotherapy at the cancer center at 4:30 today.  At baseline she is able to walk with a cane due to knee pain and able to do all her ADLs.  After code stroke was initiated and she was already in [place] it was discovered that she had a brain met on MRI from 02/05/2022.  I spoke with her son in the ER room last time he saw her normal was last night.  Currently patient is unable to communicate if there are new symptoms but feels like her vision is blurry but not able to tell me if there is there is loss of vision.  I was able to talk with her other son that presented to the ED shortly after.  He states she seemed fine to him except for confusion and decreased responsiveness but otherwise at her baseline.  LKW: 4:30pm. TNK given: No due to mets  on CT.  Not candidate for IR, no LVO on CTA.   ROS   Per HPI; all other systems reviewed and are negative  Past History   The following was personally reviewed:  Past Medical History:  Diagnosis Date   Arthritis    knee, hands   CAD S/P PCI OM1 99%-0%; Plan Staged PCI mRCA 90%. 02/19/2017   1. Severe 2 vessel obstructive CAD    - 99% thrombotic occlusion of first OM. This is a bifurcating vessel.     - 90% mid RCA-segmental 2. Good overall LV dysfunction with lateral HK 3. Moderately elevated LVEDP 4. Successful stenting of the first OM with DES. Distal embolization into the distal lateral OM branch.   Plan: DAPT for one year. Will treat with IV Aggrastat for 18 hours. Start high dose statin, beta blocker. IV Ntg for BP control acutely. Plan for stage PCI of RCA on Monday 78/93 if no complication.   Essential hypertension 02/21/2017   Hyperlipidemia with target LDL less than 70 02/21/2017   Lung cancer ([place])    Presence of drug-eluting stent in L Cx: OM1 (Xience SIERRA DES 3.5 x 15) 02/21/2017   STEMI involving left circumflex coronary artery ([place]) 02/19/2017   Tobacco abuse 02/19/2017   Past Surgical History:  Procedure Laterality Date   BRONCHIAL BIOPSY  01/15/2022   Procedure: BRONCHIAL BIOPSIES;  Surgeon: [name], MD;  Location: [facility]  ENDOSCOPY;  Service: Pulmonary;;   BRONCHIAL BRUSHINGS  01/15/2022   Procedure: BRONCHIAL BRUSHINGS;  Surgeon: [name], MD;  Location: [facility] ENDOSCOPY;  Service: Pulmonary;;   BRONCHIAL NEEDLE ASPIRATION BIOPSY  01/15/2022   Procedure: BRONCHIAL NEEDLE ASPIRATION BIOPSIES;  Surgeon: [name], MD;  Location: [facility] ENDOSCOPY;  Service: Pulmonary;;   CARDIAC CATHETERIZATION  02/21/2017   CHOLECYSTECTOMY     CORONARY STENT INTERVENTION N/A 02/19/2017   Procedure: CORONARY STENT INTERVENTION;  Surgeon: [name], Peter M, MD;  Location: [place] CV LAB;  Service: Cardiovascular;  Laterality: N/A;   CORONARY STENT INTERVENTION N/A  02/21/2017   Procedure: CORONARY STENT INTERVENTION;  Surgeon: Belva Crome, MD;  Location: [place] CV LAB;  Service: Cardiovascular;  Laterality: N/A;   FIDUCIAL MARKER PLACEMENT  01/15/2022   Procedure: FIDUCIAL MARKER PLACEMENT;  Surgeon: [name], MD;  Location: [place];  Service: Pulmonary;;   FINE NEEDLE ASPIRATION  01/15/2022   Procedure: FINE NEEDLE ASPIRATION;  Surgeon: [name], MD;  Location: [place];  Service: Pulmonary;;   HEMOSTASIS CONTROL  01/15/2022   Procedure: HEMOSTASIS CONTROL;  Surgeon: [name], MD;  Location: [facility] ENDOSCOPY;  Service: Pulmonary;;   KNEE ARTHROSCOPY Right 2010   KNEE ARTHROSCOPY WITH SUBCHONDROPLASTY Left 01/09/2020   Procedure: Left knee arthroscopy,partial medial meniscectomy, debridement, subchondroplasty left proximal tibia;  Surgeon: Susa Day, MD;  Location: [name];  Service: Orthopedics;  Laterality: Left;   LEFT HEART CATH AND CORONARY ANGIOGRAPHY N/A 02/19/2017   Procedure: LEFT HEART CATH AND CORONARY ANGIOGRAPHY;  Surgeon: [name], Peter M, MD;  Location: [place] CV LAB;  Service: Cardiovascular;  Laterality: N/A;   TONSILLECTOMY     TUBAL LIGATION     VIDEO BRONCHOSCOPY WITH ENDOBRONCHIAL ULTRASOUND N/A 01/15/2022   Procedure: VIDEO BRONCHOSCOPY WITH ENDOBRONCHIAL ULTRASOUND;  Surgeon: [name], MD;  Location: [facility] ENDOSCOPY;  Service: Pulmonary;  Laterality: N/A;   VIDEO BRONCHOSCOPY WITH RADIAL ENDOBRONCHIAL ULTRASOUND  01/15/2022   Procedure: RADIAL ENDOBRONCHIAL ULTRASOUND;  Surgeon: [name], MD;  Location: [facility] ENDOSCOPY;  Service: Pulmonary;;   Family History  Problem Relation Age of Onset   Diabetes Mother    Cancer Father        lung cancer   Heart attack Sister    Social History   Socioeconomic History   Marital status: Married    Spouse name: Not on file   Number of children: 3   Years of education: Not on file   Highest education level: Not on file   Occupational History   Not on file  Tobacco Use   Smoking status: Some Days    Packs/day: 1.50    Years: 50.00    Total pack years: 75.00    Types: Cigarettes   Smokeless tobacco: Never   Tobacco comments:    Trying to quit, currently smokes 5-6 cigarettes per day 01/21/2022  Vaping Use   Vaping Use: Never used  Substance and Sexual Activity   Alcohol use: Yes    Comment: occasional   Drug use: Never   Sexual activity: Not Currently    Birth control/protection: Surgical    Comment: Hysterectomy//Tubal Ligation  Other Topics Concern   Not on file  Social History Narrative   Family runs the [employer].   Social Determinants of Health   Financial Resource Strain: Not on file  Food Insecurity: Not on file  Transportation Needs: Not on file  Physical Activity: Not on file  Stress: Not on  file  Social Connections: Not on file   Allergies  Allergen Reactions   Codeine Nausea Only    Medications   (Not in a hospital admission)     Current Facility-Administered Medications:    sodium chloride flush (NS) 0.9 % injection 3 mL, 3 mL, Intravenous, Once, Fredia Sorrow, MD  Current Outpatient Medications:    acetaminophen (TYLENOL) 500 MG tablet, Take 500 mg by mouth 2 (two) times daily as needed for moderate pain., Disp: , Rfl:    aspirin 81 MG chewable tablet, Chew 1 tablet (81 mg total) by mouth daily., Disp: , Rfl:    atorvastatin (LIPITOR) 80 MG tablet, TAKE 1 TABLET BY MOUTH DAILY AT 6 PM., Disp: 90 tablet, Rfl: 3   Coenzyme Q10 (CO Q 10) 100 MG CAPS, Take 100-200 mg by mouth daily in the afternoon., Disp: , Rfl:    diclofenac Sodium (VOLTAREN) 1 % GEL, Apply 2 g topically daily as needed (Knee pain). , Disp: , Rfl:    ezetimibe (ZETIA) 10 MG tablet, TAKE 1 TABLET BY MOUTH EVERY DAY (Patient taking differently: Takes at night), Disp: 90 tablet, Rfl: 3   folic acid (FOLVITE) 1 MG tablet, Take 1 tablet (1 mg total) by mouth daily., Disp: 30 tablet, Rfl: 4    irbesartan (AVAPRO) 300 MG tablet, Take 1 tablet (300 mg total) by mouth daily. Please contact the office to schedule appointment for additional refills. 1st Attempt., Disp: 30 tablet, Rfl: 0   metoprolol tartrate (LOPRESSOR) 25 MG tablet, TAKE 1 TABLET BY MOUTH TWICE A DAY, Disp: 180 tablet, Rfl: 3   nicotine (NICODERM CQ - DOSED IN MG/24 HOURS) 14 mg/24hr patch, Place 1 patch (14 mg total) onto the skin daily. Apply 74m patch daily x 6 wk, then 7 mg patch daily x 2 wk; start after you quit smoking., Disp: 14 patch, Rfl: 2   nicotine (NICODERM CQ - DOSED IN MG/24 HR) 7 mg/24hr patch, Place 1 patch (7 mg total) onto the skin daily. Apply 173mpatch daily x 6 wk, then 7 mg patch daily x 2 wk; start after you quit smoking., Disp: 14 patch, Rfl: 0   nitroGLYCERIN (NITROSTAT) 0.4 MG SL tablet, Place 1 tablet (0.4 mg total) under the tongue every 5 (five) minutes x 3 doses as needed for chest pain., Disp: 25 tablet, Rfl: 2   ondansetron (ZOFRAN) 8 MG tablet, Take 1 tablet (8 mg total) by mouth every 8 (eight) hours as needed for nausea or vomiting. Starting day 3 after chemotherapy, Disp: 30 tablet, Rfl: 2   Polyethyl Glycol-Propyl Glycol (SYSTANE FREE OP), Place 2 drops into both eyes daily as needed (Floaters). , Disp: , Rfl:    prochlorperazine (COMPAZINE) 10 MG tablet, Take 1 tablet (10 mg total) by mouth every 6 (six) hours as needed for nausea or vomiting., Disp: 30 tablet, Rfl: 2   traMADol (ULTRAM) 50 MG tablet, Take 50 mg by mouth at bedtime., Disp: , Rfl:    trolamine salicylate (ASPERCREME) 10 % cream, Apply 1 application topically daily as needed (Knee pain)., Disp: , Rfl:   Vitals   Vitals:   03/22/22 1702 03/22/22 1730 03/22/22 1738 03/22/22 1739  BP: (!) 180/74 130/62    Pulse: 66 64    Resp: 15 (!) 8    Temp:    98.3 F (36.8 C)  SpO2: 98% 98%    Weight:   86.7 kg   Height:   _0  (1.6 m)  Body mass index is 33.85 kg/m.  Physical Exam   Exam performed over telemedicine  with 2-way video and audio communication and with assistance of bedside RN  Physical Exam Gen: A&O x4, NAD Resp: normal WOB CV: extremities appear well-perfused  Neuro: *MS: A&O to person and place.  Follows commands with will need prompting and there is a delayed response. *Speech: Normal speech.  She is only able to name pen but no other objects. *CN: PERRL 51m, EOMI, visual fields are difficult to assess as she kept moving her head and gave inconsistent answers with her likely appears to be a deficit.  Smile symmetric, hearing intact to voice *Motor:   Normal bulk.  No tremor, rigidity or bradykinesia. No pronator drift. All extremities appear full-strength and symmetric except for slight drift in left leg.  *Sensory: SILT. Inconsistent. *Coordination:  Finger-to-nose motions were intact. *Reflexes:  UTA 2/2 tele-exam *Gait: deferred  NIHSS  1a Level of Conscious.: 0 1b LOC Questions: 0 1c LOC Commands: 0 2 Best Gaze: 0 3 Visual: 1 4 Facial Palsy: 0 5a Motor Arm - left: 0 5b Motor Arm - Right: 0 6a Motor Leg - Left: 1 6b Motor Leg - Right: 0 7 Limb Ataxia: 0 8 Sensory: 2 9 Best Language: 2 10 Dysarthria: 1 11 Extinct. and Inatten.: 2  TOTAL: 9   Premorbid mRS = 2. Walks with a cane at baseline.   Labs   CBC:  Recent Labs  Lab 03/22/22 1325 03/22/22 1704 03/22/22 1708  WBC 7.4  --  6.9  NEUTROABS 5.1  --  5.0  HGB 13.9 13.6 13.3  HCT 40.0 40.0 40.1  MCV 89.3  --  92.6  PLT 286  --  2353   Basic Metabolic Panel:  Lab Results  Component Value Date   NA 137 03/22/2022   K 3.7 03/22/2022   CO2 25 03/22/2022   GLUCOSE 152 (H) 03/22/2022   BUN 11 03/22/2022   CREATININE 0.74 03/22/2022   CALCIUM 8.4 (L) 03/22/2022   GFRNONAA >60 03/22/2022   GFRAA 102 03/13/2020   Lipid Panel:  Lab Results  Component Value Date   LDLCALC 61 12/08/2020   HgbA1c:  Lab Results  Component Value Date   HGBA1C 5.5 02/19/2017   Urine Drug Screen: No results found  for: "LABOPIA", "COCAINSCRNUR", "LABBENZ", "AMPHETMU", "THCU", "LABBARB"  Alcohol Level No results found for: "ETH"  CT Head without contrast: IMPRESSION: 1. Multiple lesions throughout bilateral cerebral hemispheres, compatible with metastases better characterized on prior MRI. Small focus of hemorrhage in the region of a probable right parieto-occipital metastatic lesion, possibly interval hemorrhage of the metastasis. MRI with contrast could better assess if clinically warranted. 2. No definite evidence of acute large vascular territory infarct; however, the above metastases limited assessment. MRI could further evaluate.  CT angio Head and Neck with contrast: IMPRESSION: 1. No emergent large vessel occlusion or proximal high-grade stenosis. 2. Approximately 2-3 Conical outpouching arising from the posterior/inferior aspect of the left supraclinoid ICA is favored to represent an infundibulum given small vessel near the tip, although a aneurysm is not excluded. A follow-up CTA in 6-12 months could ensure stability if clinically warranted.  MRI Brain pending.  rEEG: pending.  Impression   71year old female with history of coronary disease status post stent, high blood pressure, hyperlipidemia known history of cancer with mets to the brain was at the cancer center today for chemotherapy.  She went there on her own with a TAC VLucianne Leiand  had abrupt change of left facial droop and visual changes.  On CT found to have multiple mets with small focus of hemorrhage in the right parieto-occipital lesion.  CTA was negative for LVO.  Symptoms are likely related to the mets and will need to monitor for seizure activity.  Discussed with both sons there is no role for TNK or thrombectomy.  Plan discussed with ED physician.  Dr. Leonel Ramsay aware and will round on patient tomorrow Recommendations   Brain MRI with and without contrast Routine EEG Monitor for seizure activity and call neurology  if suspected.   PT OT and speech consults Aspirin 325 stat Oncology consult Neurology will continue to follow while inpatient at [facility].  Further stroke work-up after MRI is completed   Medical decision making: High due to stat CT, CTA scans and rapid evaluation for possible thrombectomy and TNK.  Imaging reviewed personally by me.  Labs medical chart reviewed.  Discussion with multiple medical providers and obtaining history from family. ______________________________________________________________________   Thank you for the opportunity to take part in the care of this patient. If you have any further questions, please contact the neurology consultation attending.  Signed,  Egbert Garibaldi  If 7pm- 7am, please page neurology on call as listed in [place].  **Any copied and pasted documentation in this note was written by me in another application not billed for and pasted by me into this document.

## 2022-03-22 NOTE — ED Notes (Signed)
Pt to MRI

## 2022-03-22 NOTE — ED Notes (Signed)
[place] activated with EDP at bedside performing exam. 1658 Neuro paged  [phone] Pt to Ct 1704 Dr Reeves Forth joins cart [phone] Contacted CT requesting advanced images be performed as added by neuro prior to returning to ED 1712 Son at bedside talking with Neuro via cart.

## 2022-03-22 NOTE — ED Provider Notes (Signed)
[facility] DEPT Provider Note   CSN: [id number] Arrival date & time: 03/22/22  1653  An emergency department physician performed an initial assessment on this suspected stroke patient at 1700.  History  Chief Complaint  Patient presents with   Code Stroke    Kimberly Huang is a 71 y.o. female.  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  Patient was last well and normal at 430 today.  She was receiving her second infusion of medication.  They initially noted a left-sided facial droop, slurred speech and visual field cut.  Patient is denying a headache but does state her vision is abnormal.  She is denying any chest pain or shortness of breath.  PA and nurse reported that patient's blood pressure and sugar were normal upon start of symptoms.  She takes an aspirin but no other anticoagulation.  The history is provided by the patient.       Home Medications Prior to Admission medications   Medication Sig Start Date End Date Taking? Authorizing Provider  acetaminophen (TYLENOL) 500 MG tablet Take 500 mg by mouth 2 (two) times daily as needed for moderate pain.    [provider]  aspirin 81 MG chewable tablet Chew 1 tablet (81 mg total) by mouth daily. 02/23/17   Cheryln Manly, NP  atorvastatin (LIPITOR) 80 MG tablet TAKE 1 TABLET BY MOUTH DAILY AT 6 PM. 06/03/21   [name], Peter M, MD  Coenzyme Q10 (CO Q 10) 100 MG CAPS Take 100-200 mg by mouth daily in the afternoon.    [provider]  diclofenac Sodium (VOLTAREN) 1 % GEL Apply 2 g topically daily as needed (Knee pain).     [provider]  ezetimibe (ZETIA) 10 MG tablet TAKE 1 TABLET BY MOUTH EVERY DAY Patient taking differently: Takes at night 01/12/21   [name], Peter M, MD   folic acid (FOLVITE) 1 MG tablet Take 1 tablet (1 mg total) by mouth daily. 02/22/22   Curt Bears, MD  irbesartan (AVAPRO) 300 MG tablet Take 1 tablet (300 mg total) by mouth daily. Please contact the office to schedule appointment for additional refills. 1st Attempt. 03/03/22   [name], Peter M, MD  metoprolol tartrate (LOPRESSOR) 25 MG tablet TAKE 1 TABLET BY MOUTH TWICE A DAY 02/13/21   [name], Peter M, MD  nicotine (NICODERM CQ - DOSED IN MG/24 HOURS) 14 mg/24hr patch Place 1 patch (14 mg total) onto the skin daily. Apply 14mg  patch daily x 6 wk, then 7 mg patch daily x 2 wk; start after you quit smoking. 02/10/22   Eppie Gibson, MD  nicotine (NICODERM CQ - DOSED IN MG/24 HR) 7 mg/24hr patch Place 1 patch (7 mg total) onto the skin daily. Apply 14mg  patch daily x 6 wk, then 7 mg patch daily x 2 wk; start after you quit smoking. 02/10/22   Eppie Gibson, MD  nitroGLYCERIN (NITROSTAT) 0.4 MG SL tablet Place 1 tablet (0.4 mg total) under the tongue every 5 (five) minutes x 3 doses as needed for chest pain. 12/12/20   [name], Peter M, MD  ondansetron (ZOFRAN) 8 MG tablet Take 1 tablet (8 mg total) by mouth every 8 (eight) hours as needed for nausea or vomiting. Starting day 3 after chemotherapy 03/22/22   Heilingoetter, Dentist  L, PA-C  Polyethyl Glycol-Propyl Glycol (SYSTANE FREE OP) Place 2 drops into both eyes daily as needed (Floaters).     [provider]  prochlorperazine (COMPAZINE) 10 MG tablet Take 1 tablet (10 mg total) by mouth every 6 (six) hours as needed for nausea or vomiting. 03/22/22   Heilingoetter, Cassandra L, PA-C  traMADol (ULTRAM) 50 MG tablet Take 50 mg by mouth at bedtime. 10/07/21   [provider]  trolamine salicylate (ASPERCREME) 10 % cream Apply 1 application topically daily as needed (Knee pain).    [provider]      Allergies    Codeine    Review of Systems   Review of Systems  Physical Exam Updated Vital Signs BP 124/66    Pulse (!) 59   Temp 98.3 F (36.8 C)   Resp 18   Ht 5\' 3"  (1.6 m)   Wt 86.7 kg   SpO2 98%   BMI 33.85 kg/m  Physical Exam Vitals and nursing note reviewed.  Constitutional:      General: She is not in acute distress.    Appearance: She is well-developed.  HENT:     Head: Normocephalic and atraumatic.  Eyes:     General: Visual field deficit present.     Pupils: Pupils are equal, round, and reactive to light.  Cardiovascular:     Rate and Rhythm: Normal rate and regular rhythm.     Heart sounds: Normal heart sounds. No murmur heard.    No friction rub.  Pulmonary:     Effort: Pulmonary effort is normal.     Breath sounds: Normal breath sounds. No wheezing or rales.  Abdominal:     General: Bowel sounds are normal. There is no distension.     Palpations: Abdomen is soft.     Tenderness: There is no abdominal tenderness. There is no guarding or rebound.  Musculoskeletal:        General: No tenderness. Normal range of motion.     Comments: No edema  Skin:    General: Skin is warm and dry.     Findings: No rash.  Neurological:     Mental Status: She is alert and oriented to person, place, and time.     Cranial Nerves: No cranial nerve deficit.     Sensory: Sensation is intact.     Motor: Motor function is intact. No pronator drift.     Comments: Slight droop of the left corner of the mouth.  She has visual field cut on the lateral portion of her vision on the left eye in the medial portion of her vision of the right eye.  No notable slurred speech or dysarthria or aphasia at this time.  Because of her vision deficit it is difficult to test coordination but she was able to get out of the wheelchair stand and walk to the bed with minimal assistance.  Psychiatric:        Behavior: Behavior normal.     ED Results / Procedures / Treatments   Labs (all labs ordered are listed, but only abnormal results are displayed) Labs Reviewed  COMPREHENSIVE METABOLIC PANEL - Abnormal;  Notable for the following components:      Result Value   Glucose, Bld 152 (*)    Calcium 8.4 (*)    All other components within normal limits  I-STAT CHEM 8, ED - Abnormal; Notable for the following components:   Glucose, Bld 148 (*)    Calcium, Ion 1.10 (*)  All other components within normal limits  PROTIME-INR  APTT  CBC  DIFFERENTIAL  ETHANOL    EKG None  Radiology MR BRAIN W WO CONTRAST  Result Date: 03/22/2022 CLINICAL DATA:  Lung cancer, metastasis to brain, stroke suspected EXAM: MRI HEAD WITHOUT AND WITH CONTRAST TECHNIQUE: Multiplanar, multiecho pulse sequences of the brain and surrounding structures were obtained without and with intravenous contrast. CONTRAST:  8.44mL GADAVIST GADOBUTROL 1 MMOL/ML IV SOLN COMPARISON:  02/05/2022 FINDINGS: Brain: No restricted diffusion to suggest acute or subacute infarct. Again noted are multiple enhancing lesions, as follows: Left frontal lobe, 11 mm, previously 9 mm, 16:111 Left parietal lobe, 10 mm, previously 9 mm, 16:108 Right occipital lobe, 16 mm, previously 13 mm, 16:83 Left posterior frontal lobe, 3 mm, previously 4 mm, 16:139 Left frontal lobe, 5 mm, previously 5 mm, 16:124 Right posterior frontal lobe,  8 mm, previously 8 mm,16:121 Medial right parietal lobe, 5 mm, previously 5 mm, 16:112 Lateral right occipital lobe, 5 mm, previously 5 mm, 16:63 Medial right occipital lobe, 4 mm, previously 4 mm, 16:62 Left cerebellum, 13 mm, previously 13 mm, 16:41 Right cerebellum, 4 mm, previously 4 mm, 16:41 Left cerebellum, 4 mm, previously 4 mm, 16:32 Increased surrounding edema is associated with the larger lesions. Several punctate lesions noted on the prior exam are not definitively seen on the current study. No significant hemorrhage. No definite hemosiderin deposition associated with the dominant right occipital lesion. No significant mass effect or midline shift. No hydrocephalus or extra-axial collection. Vascular: Normal arterial flow  voids. Normal arterial and venous enhancement. Skull and upper cervical spine: Normal marrow signal. Sinuses/Orbits: Clear paranasal sinuses. The orbits are unremarkable. Other: The mastoids are well aerated. IMPRESSION: 1. Slight interval increase in size of several previously noted metastatic lesions, as described above, with increased surrounding edema. No significant mass effect or midline shift. 2. Several punctate lesions noted on the prior exam are not definitively seen on the current study. 3. No evidence of acute or subacute infarct. Electronically Signed   By: Merilyn Baba M.D.   On: 03/22/2022 19:40   CT ANGIO HEAD CODE STROKE  Result Date: 03/22/2022 CLINICAL DATA:  Neuro deficit, acute, stroke suspected EXAM: CT ANGIOGRAPHY HEAD AND NECK TECHNIQUE: Multidetector CT imaging of the head and neck was performed using the standard protocol during bolus administration of intravenous contrast. Multiplanar CT image reconstructions and MIPs were obtained to evaluate the vascular anatomy. Carotid stenosis measurements (when applicable) are obtained utilizing NASCET criteria, using the distal internal carotid diameter as the denominator. RADIATION DOSE REDUCTION: This exam was performed according to the departmental dose-optimization program which includes automated exposure control, adjustment of the mA and/or kV according to patient size and/or use of iterative reconstruction technique. CONTRAST:  9mL OMNIPAQUE IOHEXOL 350 MG/ML SOLN COMPARISON:  CT head from the same day. FINDINGS: CTA NECK FINDINGS Aortic arch: Great vessel origins are patent. Right carotid system: No evidence of dissection, stenosis (50% or greater), or occlusion. Tortuous ICA. Left carotid system: No evidence of dissection, stenosis (50% or greater), or occlusion. Tortuous ICA. Vertebral arteries: Codominant. No evidence of dissection, stenosis (50% or greater), or occlusion. Skeleton: Multilevel degenerative change. Other neck: No  acute findings. Upper chest: Partially imaged left upper lobe mass, better characterized on prior CT of the chest 01/08/2022. Review of the MIP images confirms the above findings CTA HEAD FINDINGS Anterior circulation: Bilateral intracranial ICAs, MCAs, and ACAs are patent without proximal high-grade stenosis. Approximately 2-3 Conical outpouching arising from the  posterior/inferior aspect of the left supraclinoid ICA is favored to represent an infundibulum given small vessel near the tip, although a aneurysm is not excluded. Posterior circulation: Bilateral intradural vertebral arteries elbow basilar artery, and bilateral posterior cerebral arteries are patent without proximal high-grade stenosis. Venous sinuses: As permitted by contrast timing, patent. Review of the MIP images confirms the above findings Brain: No evidence of acute infarction, hemorrhage, hydrocephalus, extra-axial collection or mass lesion/mass effect. IMPRESSION: 1. No emergent large vessel occlusion or proximal high-grade stenosis. 2. Approximately 2-3 Conical outpouching arising from the posterior/inferior aspect of the left supraclinoid ICA is favored to represent an infundibulum given small vessel near the tip, although a aneurysm is not excluded. A follow-up CTA in 6-12 months could ensure stability if clinically warranted. Partially imaged left upper lobe mass, better characterized on prior CT of the chest 01/08/2022. Electronically Signed   By: Margaretha Sheffield M.D.   On: 03/22/2022 17:48   CT ANGIO NECK CODE STROKE  Result Date: 03/22/2022 CLINICAL DATA:  Neuro deficit, acute, stroke suspected EXAM: CT ANGIOGRAPHY HEAD AND NECK TECHNIQUE: Multidetector CT imaging of the head and neck was performed using the standard protocol during bolus administration of intravenous contrast. Multiplanar CT image reconstructions and MIPs were obtained to evaluate the vascular anatomy. Carotid stenosis measurements (when applicable) are obtained  utilizing NASCET criteria, using the distal internal carotid diameter as the denominator. RADIATION DOSE REDUCTION: This exam was performed according to the departmental dose-optimization program which includes automated exposure control, adjustment of the mA and/or kV according to patient size and/or use of iterative reconstruction technique. CONTRAST:  66mL OMNIPAQUE IOHEXOL 350 MG/ML SOLN COMPARISON:  CT head from the same day. FINDINGS: CTA NECK FINDINGS Aortic arch: Great vessel origins are patent. Right carotid system: No evidence of dissection, stenosis (50% or greater), or occlusion. Tortuous ICA. Left carotid system: No evidence of dissection, stenosis (50% or greater), or occlusion. Tortuous ICA. Vertebral arteries: Codominant. No evidence of dissection, stenosis (50% or greater), or occlusion. Skeleton: Multilevel degenerative change. Other neck: No acute findings. Upper chest: Partially imaged left upper lobe mass, better characterized on prior CT of the chest 01/08/2022. Review of the MIP images confirms the above findings CTA HEAD FINDINGS Anterior circulation: Bilateral intracranial ICAs, MCAs, and ACAs are patent without proximal high-grade stenosis. Approximately 2-3 Conical outpouching arising from the posterior/inferior aspect of the left supraclinoid ICA is favored to represent an infundibulum given small vessel near the tip, although a aneurysm is not excluded. Posterior circulation: Bilateral intradural vertebral arteries elbow basilar artery, and bilateral posterior cerebral arteries are patent without proximal high-grade stenosis. Venous sinuses: As permitted by contrast timing, patent. Review of the MIP images confirms the above findings Brain: No evidence of acute infarction, hemorrhage, hydrocephalus, extra-axial collection or mass lesion/mass effect. IMPRESSION: 1. No emergent large vessel occlusion or proximal high-grade stenosis. 2. Approximately 2-3 Conical outpouching arising from the  posterior/inferior aspect of the left supraclinoid ICA is favored to represent an infundibulum given small vessel near the tip, although a aneurysm is not excluded. A follow-up CTA in 6-12 months could ensure stability if clinically warranted. Partially imaged left upper lobe mass, better characterized on prior CT of the chest 01/08/2022. Electronically Signed   By: Margaretha Sheffield M.D.   On: 03/22/2022 17:48   CT HEAD CODE STROKE WO CONTRAST  Result Date: 03/22/2022 CLINICAL DATA:  Code stroke. EXAM: CT HEAD WITHOUT CONTRAST TECHNIQUE: Contiguous axial images were obtained from the base of the skull through  the vertex without intravenous contrast. RADIATION DOSE REDUCTION: This exam was performed according to the departmental dose-optimization program which includes automated exposure control, adjustment of the mA and/or kV according to patient size and/or use of iterative reconstruction technique. COMPARISON:  MRI February 05, 2022. FINDINGS: Brain: Multiple lesions throughout bilateral cerebral hemispheres, compatible with metastases better characterized on prior MRI. Small focus of hemorrhage in the region of a probable lesion in the right parieto-occipital region may represent interval hemorrhage of the metastasis. No definite evidence of acute large vascular territory infarct; however, the above metastases limited assessment. No midline shift. No hydrocephalus. Vascular: No hyperdense vessel identified. Skull: No acute fracture. Sinuses/Orbits: Clear sinuses.  No acute orbital findings. IMPRESSION: 1. Multiple lesions throughout bilateral cerebral hemispheres, compatible with metastases better characterized on prior MRI. Small focus of hemorrhage in the region of a probable right parieto-occipital metastatic lesion, possibly interval hemorrhage of the metastasis. MRI with contrast could better assess if clinically warranted. 2. No definite evidence of acute large vascular territory infarct; however, the  above metastases limited assessment. MRI could further evaluate. Findings discussed with Dr. Maryan Rued via telephone at 5:19 p.m. Electronically Signed   By: Margaretha Sheffield M.D.   On: 03/22/2022 17:21    Procedures Procedures    Medications Ordered in ED Medications  sodium chloride flush (NS) 0.9 % injection 3 mL (3 mLs Intravenous Not Given 03/22/22 1729)  dexamethasone (DECADRON) injection 4 mg (has no administration in time range)  iohexol (OMNIPAQUE) 350 MG/ML injection 75 mL (75 mLs Intravenous Contrast Given 03/22/22 1721)  gadobutrol (GADAVIST) 1 MMOL/ML injection 8.5 mL (8.5 mLs Intravenous Contrast Given 03/22/22 1838)  dexamethasone (DECADRON) injection 10 mg (10 mg Intravenous Given 03/22/22 1924)    ED Course/ Medical Decision Making/ A&P                           Medical Decision Making Amount and/or Complexity of Data Reviewed Independent Historian:     Details: Oncology PA and nurse External Data Reviewed: notes.    Details: onc Labs: ordered. Decision-making details documented in ED Course. Radiology: ordered and independent interpretation performed. Decision-making details documented in ED Course.  Risk Prescription drug management. Decision regarding hospitalization.   Pt with multiple medical problems and comorbidities and presenting today with a complaint that caries a high risk for morbidity and mortality.  Here today with acute neuro changes.  Patient's blood sugar and blood pressure were normal and not the cause of her symptoms.  Patient has a history of brain mets and concern for possible new edema or bleed.  Also possible stroke.  However given patient's known brain mets she would not be a candidate for tnKase. 9:10 PM I independently interpreted patient's labs which show a normal CBC, Chem-8, coags. I have independently visualized and interpreted pt's images today.  Head CT with multiple brain mets.  Radiology called and reported the patient has multiple  lesions throughout the bilateral cerebellar hemispheres compatible with metastases with possibly a small focus of hemorrhage in the region of the probable right parieto-occipital metastasis and recommended an MRI with contrast for better delineation.  Patient has no contraindication for an MRI at this time.  She is hemodynamically stable.  MRI was ordered.  9:10 PMMRI showed slight interval increase in size of previous metastatic lesions with increased surrounding edema no significant mass effect or midline shift.  Also several punctate lesions noted on the prior exam are not definitively  seen on current studies.  No evidence of acute or subacute infarct.  Given patient's new sudden neurologic changes concern for symptomatic vasogenic edema.  She will be admitted for IV steroids.  Findings discussed with the patient's family.  They are comfortable with this plan.  Consulted the hospitalist for admission.  Oncology will follow.   CRITICAL CARE Performed by: Tayvion Lauder Total critical care time: 30 minutes Critical care time was exclusive of separately billable procedures and treating other patients. Critical care was necessary to treat or prevent imminent or life-threatening deterioration. Critical care was time spent personally by me on the following activities: development of treatment plan with patient and/or surrogate as well as nursing, discussions with consultants, evaluation of patient's response to treatment, examination of patient, obtaining history from patient or surrogate, ordering and performing treatments and interventions, ordering and review of laboratory studies, ordering and review of radiographic studies, pulse oximetry and re-evaluation of patient's condition.         Final Clinical Impression(s) / ED Diagnoses Final diagnoses:  Vasogenic brain edema [facility])  Neurocognitive deficits    Rx / DC Orders ED Discharge Orders     None         Blanchie Dessert,  MD 03/22/22 2110

## 2022-03-22 NOTE — H&P (Signed)
History and Physical    Patient: Kimberly Huang DOB: 07/26/1950 DOA: 03/22/2022 DOS: the patient was seen and examined on 03/22/2022 PCP: Patient, No Pcp Per  Patient coming from: Home  Chief Complaint:  Chief Complaint  Patient presents with   Code Stroke   HPI: Kimberly Huang is a 71 y.o. 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  Patient apparently had sudden onset of dysarthria, visual changes and left facial droop at the cancer center.  She was sent over to the ER as a code stroke.  Arrived the ER where a code stroke was called for.  Neurology was involved.  Her oncologist was also involved.  With her known brain mass both CT and MRI performed ultimately showing vasogenic edema probably related to her brain metastasis.  Patient was unable to communicate at the time but currently fully awake and alert.  She is communicating.  Vision has improved.  Speech has returned to normal.  It was presumed therefore the this was not a stroke.  Patient being admitted to the hospital for management of acute neurologic symptoms secondary to vasogenic edema.  Recommendation is to initiate steroids.  Review of Systems: As mentioned in the history of present illness. All other systems reviewed and are negative. Past Medical History:  Diagnosis Date   Arthritis    knee, hands   CAD S/P PCI OM1 99%-0%; Plan Staged PCI mRCA 90%. 02/19/2017   1. Severe 2 vessel obstructive CAD    - 99% thrombotic occlusion of first OM. This is a bifurcating vessel.     - 90% mid RCA-segmental 2. Good overall LV dysfunction with lateral HK 3. Moderately elevated LVEDP 4.  Successful stenting of the first OM with DES. Distal embolization into the distal lateral OM branch.   Plan: DAPT for one year. Will treat with IV Aggrastat for 18 hours. Start high dose statin, beta blocker. IV Ntg for BP control acutely. Plan for stage PCI of RCA on Monday 16/60 if no complication.   Essential hypertension 02/21/2017   Hyperlipidemia with target LDL less than 70 02/21/2017   Lung cancer ([place])    Presence of drug-eluting stent in L Cx: OM1 (Xience SIERRA DES 3.5 x 15) 02/21/2017   STEMI involving left circumflex coronary artery ([place]) 02/19/2017   Tobacco abuse 02/19/2017   Past Surgical History:  Procedure Laterality Date   BRONCHIAL BIOPSY  01/15/2022   Procedure: BRONCHIAL BIOPSIES;  Surgeon: [name], MD;  Location: [facility] ENDOSCOPY;  Service: Pulmonary;;   BRONCHIAL BRUSHINGS  01/15/2022   Procedure: BRONCHIAL BRUSHINGS;  Surgeon: [name], MD;  Location: [facility] ENDOSCOPY;  Service: Pulmonary;;   BRONCHIAL NEEDLE ASPIRATION BIOPSY  01/15/2022   Procedure: BRONCHIAL NEEDLE ASPIRATION BIOPSIES;  Surgeon: [name], MD;  Location: [facility] ENDOSCOPY;  Service: Pulmonary;;   CARDIAC CATHETERIZATION  02/21/2017   CHOLECYSTECTOMY     CORONARY STENT INTERVENTION N/A 02/19/2017   Procedure: CORONARY STENT INTERVENTION;  Surgeon: [name], Peter M, MD;  Location: [place] CV LAB;  Service: Cardiovascular;  Laterality: N/A;   CORONARY STENT INTERVENTION N/A 02/21/2017   Procedure: CORONARY STENT INTERVENTION;  Surgeon: Belva Crome, MD;  Location: [place] CV LAB;  Service: Cardiovascular;  Laterality: N/A;   FIDUCIAL MARKER PLACEMENT  01/15/2022   Procedure: FIDUCIAL MARKER PLACEMENT;  Surgeon: [name], MD;  Location: [place];  Service: Pulmonary;;   FINE NEEDLE ASPIRATION  01/15/2022   Procedure: FINE NEEDLE ASPIRATION;  Surgeon: [name], MD;  Location: [place];  Service: Pulmonary;;   HEMOSTASIS CONTROL  01/15/2022   Procedure:  HEMOSTASIS CONTROL;  Surgeon: [name], MD;  Location: [facility] ENDOSCOPY;  Service: Pulmonary;;   KNEE ARTHROSCOPY Right 2010   KNEE ARTHROSCOPY WITH SUBCHONDROPLASTY Left 01/09/2020   Procedure: Left knee arthroscopy,partial medial meniscectomy, debridement, subchondroplasty left proximal tibia;  Surgeon: Susa Day, MD;  Location: [name];  Service: Orthopedics;  Laterality: Left;   LEFT HEART CATH AND CORONARY ANGIOGRAPHY N/A 02/19/2017   Procedure: LEFT HEART CATH AND CORONARY ANGIOGRAPHY;  Surgeon: [name], Peter M, MD;  Location: [place] CV LAB;  Service: Cardiovascular;  Laterality: N/A;   TONSILLECTOMY     TUBAL LIGATION     VIDEO BRONCHOSCOPY WITH ENDOBRONCHIAL ULTRASOUND N/A 01/15/2022   Procedure: VIDEO BRONCHOSCOPY WITH ENDOBRONCHIAL ULTRASOUND;  Surgeon: [name], MD;  Location: [facility] ENDOSCOPY;  Service: Pulmonary;  Laterality: N/A;   VIDEO BRONCHOSCOPY WITH RADIAL ENDOBRONCHIAL ULTRASOUND  01/15/2022   Procedure: RADIAL ENDOBRONCHIAL ULTRASOUND;  Surgeon: [name], MD;  Location: [facility] ENDOSCOPY;  Service: Pulmonary;;   Social History:  reports that she has been smoking cigarettes. She has a 75.00 pack-year smoking history. She has never used smokeless tobacco. She reports current alcohol use. She reports that she does not use drugs.  Allergies  Allergen Reactions   Codeine Nausea Only    Family History  Problem Relation Age of Onset   Diabetes Mother    Cancer Father        lung cancer   Heart attack Sister     Prior to Admission medications   Medication Sig Start Date End Date Taking? Authorizing Provider  acetaminophen (TYLENOL) 500 MG tablet Take 500 mg by mouth 2 (two) times daily as needed for moderate pain.    [provider]  aspirin 81 MG chewable tablet Chew 1 tablet (81 mg total) by mouth daily. 02/23/17   Cheryln Manly, NP  atorvastatin (LIPITOR) 80 MG tablet TAKE 1 TABLET BY MOUTH DAILY AT 6 PM. 06/03/21   [name], Peter M,  MD  Coenzyme Q10 (CO Q 10) 100 MG CAPS Take 100-200 mg by mouth daily in the afternoon.    [provider]  diclofenac Sodium (VOLTAREN) 1 % GEL Apply 2 g topically daily as needed (Knee pain).     [provider]  ezetimibe (ZETIA) 10 MG tablet TAKE 1 TABLET BY MOUTH EVERY DAY Patient taking differently: Takes at night 01/12/21   [name], Peter M, MD  folic acid (FOLVITE) 1 MG tablet Take 1 tablet (1 mg total) by mouth daily. 02/22/22   Curt Bears, MD  irbesartan (AVAPRO) 300 MG tablet Take 1 tablet (300 mg total) by mouth daily. Please contact the office to schedule appointment for additional refills. 1st Attempt. 03/03/22   [name], Peter M, MD  metoprolol tartrate (LOPRESSOR) 25 MG tablet TAKE 1 TABLET BY MOUTH TWICE A DAY 02/13/21   [name], Peter M, MD  nicotine (NICODERM CQ - DOSED IN MG/24 HOURS) 14 mg/24hr patch Place 1 patch (14 mg total) onto the skin daily. Apply 14mg  patch daily x 6 wk, then 7 mg patch daily x 2 wk; start after you quit smoking. 02/10/22  Eppie Gibson, MD  nicotine (NICODERM CQ - DOSED IN MG/24 HR) 7 mg/24hr patch Place 1 patch (7 mg total) onto the skin daily. Apply 14mg  patch daily x 6 wk, then 7 mg patch daily x 2 wk; start after you quit smoking. 02/10/22   Eppie Gibson, MD  nitroGLYCERIN (NITROSTAT) 0.4 MG SL tablet Place 1 tablet (0.4 mg total) under the tongue every 5 (five) minutes x 3 doses as needed for chest pain. 12/12/20   [name], Peter M, MD  ondansetron (ZOFRAN) 8 MG tablet Take 1 tablet (8 mg total) by mouth every 8 (eight) hours as needed for nausea or vomiting. Starting day 3 after chemotherapy 03/22/22   Heilingoetter, Cassandra L, PA-C  Polyethyl Glycol-Propyl Glycol (SYSTANE FREE OP) Place 2 drops into both eyes daily as needed (Floaters).     [provider]  prochlorperazine (COMPAZINE) 10 MG tablet Take 1 tablet (10 mg total) by mouth every 6 (six) hours as needed for nausea or vomiting. 03/22/22   Heilingoetter,  Cassandra L, PA-C  traMADol (ULTRAM) 50 MG tablet Take 50 mg by mouth at bedtime. 10/07/21   [provider]  trolamine salicylate (ASPERCREME) 10 % cream Apply 1 application topically daily as needed (Knee pain).    [provider]    Physical Exam: Vitals:   03/22/22 1900 03/22/22 1915 03/22/22 1930 03/22/22 1945  BP: (!) 146/91 110/72 121/74 124/66  Pulse: 73 66 65 (!) 59  Resp: 19 14 15 18   Temp:      SpO2: 100% 100% 99% 98%  Weight:      Height:       Constitutional: Pleasant, acutely ill looking, no distress NAD, calm, comfortable Eyes: Decreased visual field medially ENMT: Mucous membranes are moist. Posterior pharynx clear of any exudate or lesions.Normal dentition.  Neck: normal, supple, no masses, no thyromegaly Respiratory: clear to auscultation bilaterally, no wheezing, no crackles. Normal respiratory effort. No accessory muscle use.  Cardiovascular: Regular rate and rhythm, no murmurs / rubs / gallops. No extremity edema. 2+ pedal pulses. No carotid bruits.  Abdomen: no tenderness, no masses palpated. No hepatosplenomegaly. Bowel sounds positive.  Musculoskeletal: Good range of motion, no joint swelling or tenderness, Skin: no rashes, lesions, ulcers. No induration Neurologic: CN 2-12 grossly intact. Sensation intact, DTR normal. Strength 5/5 in all 4.  Psychiatric: Normal judgment and insight. Alert and oriented x 3. Normal mood  Data Reviewed:  CT head without contrast showed multiple lesions throughout the bilateral cerebral hemispheres compatible with metastasis seen on prior MRI.  No acute infarct.  CT angiogram of the head and neck with contrast showed no emergent large vessel occlusion.  MRI of the brain showed slight interval increase in several previously noted metastatic lesions.  There was increased surrounding edema no significant mass effect or midline shift.  Also several punctate lesions noted on the prior exam were not seen.  Assessment and  Plan:  #1 acute neurologic symptoms: Suspected due to brain mets with vasogenic edema.  Appreciate neurology help.  Patient initiated on IV steroids.  Supportive care.  Monitoring.  Symptoms already improving.  Oncology and neurology to follow.  Radiation oncology may also likely follow.  #2 stage IV adenocarcinoma of the left lung: Continue treatment per oncology.  #3 essential hypertension: Continue home regimen.  #4 coronary artery disease: Stable.  Continue to monitor  #5 tobacco abuse: Counseling provided.  Currently quit smoking.     Advance Care Planning:   Code Status: Prior full code  Consults: Dr. Lindi Adie, oncology, Dr. Reeves Forth, neurology  Family Communication: 2 sons at bedside  Severity of Illness: The appropriate patient status for this patient is INPATIENT. Inpatient status is judged to be reasonable and necessary in order to provide the required intensity of service to ensure the patient's safety. The patient's presenting symptoms, physical exam findings, and initial radiographic and laboratory data in the context of their chronic comorbidities is felt to place them at high risk for further clinical deterioration. Furthermore, it is not anticipated that the patient will be medically stable for discharge from the hospital within 2 midnights of admission.   * I certify that at the point of admission it is my clinical judgment that the patient will require inpatient hospital care spanning beyond 2 midnights from the point of admission due to high intensity of service, high risk for further deterioration and high frequency of surveillance required.*  AuthorBarbette Merino, MD 03/22/2022 8:44 PM  For on call review [email or web address].

## 2022-03-22 NOTE — ED Notes (Signed)
[facility] aware of abnormal Non con results.

## 2022-03-22 NOTE — ED Notes (Signed)
[place] reported no LVO and outpouching posterior/inferior aspect of the left supraclinoid ICA is favored to  represent an infundibulum given small vessel near the tip, although  a aneurysm is not excluded.

## 2022-03-22 NOTE — Progress Notes (Signed)
At 1632 Pt c/o feeling cold all over and nauseous. Pt stopped conversing with nurses. Alimta infusion completed at this time and normal saline flush started.  At 1633 this RN assessed Pt and noted slurred speech that progressed to garbled. VS obtained and were stable. This RN notified Anda Kraft, PA-C in symptom management. At [place], [place] chairside assessing Pt.  At 1648 Infusion discontinued and this RN and Anda Kraft, PA-C transported Pt to ED for Code Stroke.  33 Pt's family was contacted and informed of Pt's status. At 1653 handoff to ED was completed.  1631 was the last known well for this Pt.

## 2022-03-22 NOTE — ED Notes (Signed)
45 Pt returns from CT.

## 2022-03-22 NOTE — ED Triage Notes (Signed)
Pt brought over from [place] center. Pt is having left sided facial droop and visual field changes. LKW 1631.

## 2022-03-23 ENCOUNTER — Other Ambulatory Visit (HOSPITAL_COMMUNITY): Payer: Self-pay

## 2022-03-23 ENCOUNTER — Encounter: Payer: Self-pay | Admitting: Internal Medicine

## 2022-03-23 ENCOUNTER — Other Ambulatory Visit: Payer: Self-pay | Admitting: Cardiology

## 2022-03-23 DIAGNOSIS — F17210 Nicotine dependence, cigarettes, uncomplicated: Secondary | ICD-10-CM

## 2022-03-23 DIAGNOSIS — I2510 Atherosclerotic heart disease of native coronary artery without angina pectoris: Secondary | ICD-10-CM | POA: Diagnosis not present

## 2022-03-23 DIAGNOSIS — C7931 Secondary malignant neoplasm of brain: Secondary | ICD-10-CM

## 2022-03-23 DIAGNOSIS — E782 Mixed hyperlipidemia: Secondary | ICD-10-CM

## 2022-03-23 DIAGNOSIS — C3492 Malignant neoplasm of unspecified part of left bronchus or lung: Secondary | ICD-10-CM

## 2022-03-23 DIAGNOSIS — I10 Essential (primary) hypertension: Secondary | ICD-10-CM

## 2022-03-23 DIAGNOSIS — D496 Neoplasm of unspecified behavior of brain: Secondary | ICD-10-CM | POA: Diagnosis not present

## 2022-03-23 DIAGNOSIS — R4789 Other speech disturbances: Secondary | ICD-10-CM

## 2022-03-23 LAB — CBC
HCT: 40 % (ref 36.0–46.0)
Hemoglobin: 13.3 g/dL (ref 12.0–15.0)
MCH: 30 pg (ref 26.0–34.0)
MCHC: 33.3 g/dL (ref 30.0–36.0)
MCV: 90.1 fL (ref 80.0–100.0)
Platelets: 284 10*3/uL (ref 150–400)
RBC: 4.44 MIL/uL (ref 3.87–5.11)
RDW: 13.4 % (ref 11.5–15.5)
WBC: 6.8 10*3/uL (ref 4.0–10.5)
nRBC: 0 % (ref 0.0–0.2)

## 2022-03-23 LAB — COMPREHENSIVE METABOLIC PANEL
ALT: 25 U/L (ref 0–44)
AST: 24 U/L (ref 15–41)
Albumin: 3.6 g/dL (ref 3.5–5.0)
Alkaline Phosphatase: 78 U/L (ref 38–126)
Anion gap: 9 (ref 5–15)
BUN: 10 mg/dL (ref 8–23)
CO2: 22 mmol/L (ref 22–32)
Calcium: 8.8 mg/dL — ABNORMAL LOW (ref 8.9–10.3)
Chloride: 106 mmol/L (ref 98–111)
Creatinine, Ser: 0.66 mg/dL (ref 0.44–1.00)
GFR, Estimated: >60 mL/min (ref >60)
Glucose, Bld: 147 mg/dL — ABNORMAL HIGH (ref 70–99)
Potassium: 3.8 mmol/L (ref 3.5–5.1)
Sodium: 137 mmol/L (ref 135–145)
Total Bilirubin: 0.6 mg/dL (ref 0.3–1.2)
Total Protein: 7.3 g/dL (ref 6.5–8.1)

## 2022-03-23 MED ORDER — PANTOPRAZOLE SODIUM 40 MG PO TBEC
40.0000 mg | DELAYED_RELEASE_TABLET | Freq: Every day | ORAL | 0 refills | Status: DC
  Filled 2022-03-23: qty 30, 30d supply, fill #0

## 2022-03-23 MED ORDER — HEPARIN SOD (PORK) LOCK FLUSH 100 UNIT/ML IV SOLN: 500.0000 [IU] | Freq: Once | INTRAVENOUS | Status: DC

## 2022-03-23 MED ORDER — DEXAMETHASONE 4 MG PO TABS
ORAL_TABLET | ORAL | 0 refills | Status: AC
  Filled 2022-03-23: qty 66, 27d supply, fill #0

## 2022-03-23 MED ORDER — DEXAMETHASONE 4 MG PO TABS
4.0000 mg | ORAL_TABLET | Freq: Four times a day (QID) | ORAL | Status: DC
  Administered 2022-03-23 (×2): 4 mg via ORAL
  Filled 2022-03-23 (×2): qty 1

## 2022-03-23 MED ORDER — ORAL CARE MOUTH RINSE: 15.0000 mL | OROMUCOSAL | Status: DC | PRN

## 2022-03-23 MED ORDER — HEPARIN SOD (PORK) LOCK FLUSH 100 UNIT/ML IV SOLN
INTRAVENOUS | Status: AC
  Filled 2022-03-23: qty 5

## 2022-03-23 NOTE — Progress Notes (Signed)
DIAGNOSIS: Stage IV (T1c, N2, M1 C) non-small cell lung cancer, adenocarcinoma with positive KRAS G12C mutation diagnosed in September 2023 and presented with left upper lobe lung mass in addition to left hilar and mediastinal lymphadenopathy as well as innumerable brain metastasis.   PRIOR THERAPY: SRS to multiple brain metastasis under the care of Dr. [name] on February 24, 2022.   CURRENT THERAPY: Palliative systemic chemotherapy with carboplatin for AUC of 5, Alimta 500 Mg/M2 and Keytruda 200 Mg IV every 3 weeks.  Status post 2 cycles.  Subjective: The patient is seen and examined today she is feeling much better and she was sitting in her bed watching TV and ready to eat her breakfast.  She denied having any current chest pain, shortness of breath, cough or hemoptysis.  She denied having any nausea, vomiting, diarrhea or constipation.  She has no headache or visual changes.  She was admitted from the office yesterday with some neurological changes including visual changes as well as slurred speech and left facial droop.  During her evaluation at the emergency department she was seen by neurology and she had imaging studies including CT scan and MRI of the brain that showed slight increase of the brain metastasis with vasogenic edema.  She is currently on dexamethasone 4 mg every 6 hours and she is feeling much better.  Objective: Vital signs in last 24 hours: Temp:  [98 F (36.7 C)-98.7 F (37.1 C)] 98.1 F (36.7 C) (11/21 0601) Pulse Rate:  [56-74] 74 (11/21 0601) Resp:  [8-20] 17 (11/21 0601) BP: (110-180)/(60-91) 136/83 (11/21 0601) SpO2:  [87 %-100 %] 99 % (11/21 0601) Weight:  [189 lb 9.6 oz (86 kg)-191 lb 1.6 oz (86.7 kg)] 191 lb 1.6 oz (86.7 kg) (11/20 1738)  Intake/Output from previous day: 11/20 0701 - 11/21 0700 In: 404.2 [I.V.:404.2] Out: 250 [Urine:250] Intake/Output this shift: No intake/output data recorded.  General appearance: alert, cooperative, fatigued, and no  distress Resp: clear to auscultation bilaterally Cardio: regular rate and rhythm, S1, S2 normal, no murmur, click, rub or gallop GI: soft, non-tender; bowel sounds normal; no masses,  no organomegaly Extremities: extremities normal, atraumatic, no cyanosis or edema  Lab Results:  Recent Labs    03/22/22 1708 03/23/22 0614  WBC 6.9 6.8  HGB 13.3 13.3  HCT 40.1 40.0  PLT 285 284   BMET Recent Labs    03/22/22 1708 03/23/22 0614  NA 137 137  K 3.7 3.8  CL 105 106  CO2 25 22  GLUCOSE 152* 147*  BUN 11 10  CREATININE 0.74 0.66  CALCIUM 8.4* 8.8*    Studies/Results: MR BRAIN W WO CONTRAST  Result Date: 03/22/2022 CLINICAL DATA:  Lung cancer, metastasis to brain, stroke suspected EXAM: MRI HEAD WITHOUT AND WITH CONTRAST TECHNIQUE: Multiplanar, multiecho pulse sequences of the brain and surrounding structures were obtained without and with intravenous contrast. CONTRAST:  8.97m GADAVIST GADOBUTROL 1 MMOL/ML IV SOLN COMPARISON:  02/05/2022 FINDINGS: Brain: No restricted diffusion to suggest acute or subacute infarct. Again noted are multiple enhancing lesions, as follows: Left frontal lobe, 11 mm, previously 9 mm, 16:111 Left parietal lobe, 10 mm, previously 9 mm, 16:108 Right occipital lobe, 16 mm, previously 13 mm, 16:83 Left posterior frontal lobe, 3 mm, previously 4 mm, 16:139 Left frontal lobe, 5 mm, previously 5 mm, 16:124 Right posterior frontal lobe,  8 mm, previously 8 mm,16:121 Medial right parietal lobe, 5 mm, previously 5 mm, 16:112 Lateral right occipital lobe, 5 mm, previously 5 mm, 16:63  Medial right occipital lobe, 4 mm, previously 4 mm, 16:62 Left cerebellum, 13 mm, previously 13 mm, 16:41 Right cerebellum, 4 mm, previously 4 mm, 16:41 Left cerebellum, 4 mm, previously 4 mm, 16:32 Increased surrounding edema is associated with the larger lesions. Several punctate lesions noted on the prior exam are not definitively seen on the current study. No significant hemorrhage. No  definite hemosiderin deposition associated with the dominant right occipital lesion. No significant mass effect or midline shift. No hydrocephalus or extra-axial collection. Vascular: Normal arterial flow voids. Normal arterial and venous enhancement. Skull and upper cervical spine: Normal marrow signal. Sinuses/Orbits: Clear paranasal sinuses. The orbits are unremarkable. Other: The mastoids are well aerated. IMPRESSION: 1. Slight interval increase in size of several previously noted metastatic lesions, as described above, with increased surrounding edema. No significant mass effect or midline shift. 2. Several punctate lesions noted on the prior exam are not definitively seen on the current study. 3. No evidence of acute or subacute infarct. Electronically Signed   By: Merilyn Baba M.D.   On: 03/22/2022 19:40   CT ANGIO HEAD CODE STROKE  Result Date: 03/22/2022 CLINICAL DATA:  Neuro deficit, acute, stroke suspected EXAM: CT ANGIOGRAPHY HEAD AND NECK TECHNIQUE: Multidetector CT imaging of the head and neck was performed using the standard protocol during bolus administration of intravenous contrast. Multiplanar CT image reconstructions and MIPs were obtained to evaluate the vascular anatomy. Carotid stenosis measurements (when applicable) are obtained utilizing NASCET criteria, using the distal internal carotid diameter as the denominator. RADIATION DOSE REDUCTION: This exam was performed according to the departmental dose-optimization program which includes automated exposure control, adjustment of the mA and/or kV according to patient size and/or use of iterative reconstruction technique. CONTRAST:  35m OMNIPAQUE IOHEXOL 350 MG/ML SOLN COMPARISON:  CT head from the same day. FINDINGS: CTA NECK FINDINGS Aortic arch: Great vessel origins are patent. Right carotid system: No evidence of dissection, stenosis (50% or greater), or occlusion. Tortuous ICA. Left carotid system: No evidence of dissection, stenosis  (50% or greater), or occlusion. Tortuous ICA. Vertebral arteries: Codominant. No evidence of dissection, stenosis (50% or greater), or occlusion. Skeleton: Multilevel degenerative change. Other neck: No acute findings. Upper chest: Partially imaged left upper lobe mass, better characterized on prior CT of the chest 01/08/2022. Review of the MIP images confirms the above findings CTA HEAD FINDINGS Anterior circulation: Bilateral intracranial ICAs, MCAs, and ACAs are patent without proximal high-grade stenosis. Approximately 2-3 Conical outpouching arising from the posterior/inferior aspect of the left supraclinoid ICA is favored to represent an infundibulum given small vessel near the tip, although a aneurysm is not excluded. Posterior circulation: Bilateral intradural vertebral arteries elbow basilar artery, and bilateral posterior cerebral arteries are patent without proximal high-grade stenosis. Venous sinuses: As permitted by contrast timing, patent. Review of the MIP images confirms the above findings Brain: No evidence of acute infarction, hemorrhage, hydrocephalus, extra-axial collection or mass lesion/mass effect. IMPRESSION: 1. No emergent large vessel occlusion or proximal high-grade stenosis. 2. Approximately 2-3 Conical outpouching arising from the posterior/inferior aspect of the left supraclinoid ICA is favored to represent an infundibulum given small vessel near the tip, although a aneurysm is not excluded. A follow-up CTA in 6-12 months could ensure stability if clinically warranted. Partially imaged left upper lobe mass, better characterized on prior CT of the chest 01/08/2022. Electronically Signed   By: FMargaretha SheffieldM.D.   On: 03/22/2022 17:48   CT ANGIO NECK CODE STROKE  Result Date: 03/22/2022 CLINICAL DATA:  Neuro deficit, acute, stroke suspected EXAM: CT ANGIOGRAPHY HEAD AND NECK TECHNIQUE: Multidetector CT imaging of the head and neck was performed using the standard protocol during  bolus administration of intravenous contrast. Multiplanar CT image reconstructions and MIPs were obtained to evaluate the vascular anatomy. Carotid stenosis measurements (when applicable) are obtained utilizing NASCET criteria, using the distal internal carotid diameter as the denominator. RADIATION DOSE REDUCTION: This exam was performed according to the departmental dose-optimization program which includes automated exposure control, adjustment of the mA and/or kV according to patient size and/or use of iterative reconstruction technique. CONTRAST:  44m OMNIPAQUE IOHEXOL 350 MG/ML SOLN COMPARISON:  CT head from the same day. FINDINGS: CTA NECK FINDINGS Aortic arch: Great vessel origins are patent. Right carotid system: No evidence of dissection, stenosis (50% or greater), or occlusion. Tortuous ICA. Left carotid system: No evidence of dissection, stenosis (50% or greater), or occlusion. Tortuous ICA. Vertebral arteries: Codominant. No evidence of dissection, stenosis (50% or greater), or occlusion. Skeleton: Multilevel degenerative change. Other neck: No acute findings. Upper chest: Partially imaged left upper lobe mass, better characterized on prior CT of the chest 01/08/2022. Review of the MIP images confirms the above findings CTA HEAD FINDINGS Anterior circulation: Bilateral intracranial ICAs, MCAs, and ACAs are patent without proximal high-grade stenosis. Approximately 2-3 Conical outpouching arising from the posterior/inferior aspect of the left supraclinoid ICA is favored to represent an infundibulum given small vessel near the tip, although a aneurysm is not excluded. Posterior circulation: Bilateral intradural vertebral arteries elbow basilar artery, and bilateral posterior cerebral arteries are patent without proximal high-grade stenosis. Venous sinuses: As permitted by contrast timing, patent. Review of the MIP images confirms the above findings Brain: No evidence of acute infarction, hemorrhage,  hydrocephalus, extra-axial collection or mass lesion/mass effect. IMPRESSION: 1. No emergent large vessel occlusion or proximal high-grade stenosis. 2. Approximately 2-3 Conical outpouching arising from the posterior/inferior aspect of the left supraclinoid ICA is favored to represent an infundibulum given small vessel near the tip, although a aneurysm is not excluded. A follow-up CTA in 6-12 months could ensure stability if clinically warranted. Partially imaged left upper lobe mass, better characterized on prior CT of the chest 01/08/2022. Electronically Signed   By: FMargaretha SheffieldM.D.   On: 03/22/2022 17:48   CT HEAD CODE STROKE WO CONTRAST  Result Date: 03/22/2022 CLINICAL DATA:  Code stroke. EXAM: CT HEAD WITHOUT CONTRAST TECHNIQUE: Contiguous axial images were obtained from the base of the skull through the vertex without intravenous contrast. RADIATION DOSE REDUCTION: This exam was performed according to the departmental dose-optimization program which includes automated exposure control, adjustment of the mA and/or kV according to patient size and/or use of iterative reconstruction technique. COMPARISON:  MRI February 05, 2022. FINDINGS: Brain: Multiple lesions throughout bilateral cerebral hemispheres, compatible with metastases better characterized on prior MRI. Small focus of hemorrhage in the region of a probable lesion in the right parieto-occipital region may represent interval hemorrhage of the metastasis. No definite evidence of acute large vascular territory infarct; however, the above metastases limited assessment. No midline shift. No hydrocephalus. Vascular: No hyperdense vessel identified. Skull: No acute fracture. Sinuses/Orbits: Clear sinuses.  No acute orbital findings. IMPRESSION: 1. Multiple lesions throughout bilateral cerebral hemispheres, compatible with metastases better characterized on prior MRI. Small focus of hemorrhage in the region of a probable right parieto-occipital  metastatic lesion, possibly interval hemorrhage of the metastasis. MRI with contrast could better assess if clinically warranted. 2. No definite evidence of acute large vascular  territory infarct; however, the above metastases limited assessment. MRI could further evaluate. Findings discussed with Dr. Maryan Rued via telephone at 5:19 p.m. Electronically Signed   By: Margaretha Sheffield M.D.   On: 03/22/2022 17:21    Medications: I have reviewed the patient's current medications.   Assessment/Plan: This is a very pleasant 71 years old white female with a stage IV (T1c, N2, M1 C) non-small cell lung cancer, adenocarcinoma with positive KRAS G12C mutation diagnosed in September 2023 and presented with left upper lobe lung mass in addition to left hilar and mediastinal lymphadenopathy as well as innumerable brain metastasis status post SRS to multiple brain metastasis by radiation oncology.  The patient is currently undergoing palliative systemic chemotherapy with carboplatin, Alimta and Keytruda status post 2 cycles.  She has been tolerating her treatment well but after the second cycle infusion at the cancer center yesterday she had some slurred speech as well as neurological changes suspicious for stroke. The imaging studies showed mild increase in some of the brain metastasis with vasogenic edema and the patient is responding very well to dexamethasone.  She is currently on 4 mg p.o. every 6 hours but this need to be tapered slowly over the next few weeks before the next cycle of her treatment. The patient is feeling much better today. She would like to go home and I feel that she will be appropriate to go home if there is no other concerning issues. We will continue to monitor her closely on outpatient basis. I would consider consulting neuro-oncology as well as radiation oncology for close monitoring of her condition with the slight increase in the brain metastasis. Thank you so much for taking good care  of Ms. Fenton Foy.  Please call if you have any other questions. Disclaimer: This note was dictated with voice recognition software. Similar sounding words can inadvertently be transcribed and may be missed upon review.   LOS: 0 days    Eilleen Kempf 03/23/2022

## 2022-03-23 NOTE — Discharge Instructions (Signed)
Take newly prescribed medications exactly as instructed including your entire tapering regimen of steroid therapy and your daily acid suppressant agent to minimize the risk of gastrointestinal bleeding Ambulate as tolerated using a cane or some other assistive device Go to all outpatient follow-up appointments as scheduled including follow-up with your oncologist Dr. [name] Return to the emergency department if you develop worsening weakness, visual changes, severe headaches or confusion Consume a low-sodium diet

## 2022-03-23 NOTE — Discharge Summary (Signed)
Physician Discharge Summary   Patient: Kimberly Huang MRN: [id number] DOB: [birth year]  Admit date:     03/22/2022  Discharge date: 03/23/22  Discharge Physician: Vernelle Emerald   PCP: Patient, No Pcp Per   Recommendations at discharge:   Patient advised to take a slow tapering regimen of dexamethasone as well as Protonix for GI prophylaxis  Patient advised to follow closely as an outpatient with her oncologist Dr. Julien Huang Patient advised to ambulate as tolerated using a cane or some other assistive device Patient advised to return to the emergency department if she develops worsening weakness, visual changes, severe headaches or confusion low-sodium diet  Discharge Diagnoses: Principal Problem:   Garbled speech Active Problems:   Metastatic cancer to brain [name])   Adenocarcinoma of left lung, stage 4 ([name])   Coronary artery disease involving native coronary artery of native heart without angina pectoris   Essential hypertension   Mixed hyperlipidemia   Nicotine dependence, cigarettes, uncomplicated  Resolved Problems:   * No resolved hospital problems. *   Hospital Course: 71 year old female with past medical history of coronary artery disease status post STEMI (01/2017, cath revealed severe 2 vessel disease - S/P DES to left circuflex), hyperlipidemia, hypertension, nicotine dependence on cigarettes, and stage IV [name] of the lung (Dx 01/2022, follows with Dr. Julien Huang) with mets to the brain (S/P [name] 02/24/2022) receiving palliative chemotherapy with carboplatin, Alimta and Keytruda.  Patient presented to [name] emergency department from the oncology clinic due to concerns for left-sided facial droop and garbled speech that began mid chemotherapy infusion.  This was stopped and the patient was brought over for evaluation.  Upon evaluation in the emergency department no tPA was administered.  Teleneurology consult was obtained.  MRI brain with and without  contrast revealed no evidence of acute stroke but did reveal slight interval increase in size of previously noted metastatic lesions.  Neurologic symptoms were felt to be secondary to extension of known metastatic disease.  The hospitalist group was contacted and the patient was admitted to the medical service on systemic steroid therapy.  Patient dramatically clinically improved overnight and neurologically returned to baseline by the following day.  Upon reassessment, it was deemed that patient was safe to be discharged home on a slow taper of oral dexamethasone therapy.  Patient was additionally prescribed a home-going regimen of prophylactic Protonix over the course of this taper.  Patient was discharged in improved and stable condition and was advised to follow-up closely as an outpatient with Dr. Julien Huang her oncologist.      Pain control - [name] Controlled Substance Reporting System database was reviewed. and patient was instructed, not to drive, operate heavy machinery, perform activities at heights, swimming or participation in water activities or provide baby-sitting services while on Pain, Sleep and Anxiety Medications; until their outpatient Physician has advised to do so again. Also recommended to not to take more than prescribed Pain, Sleep and Anxiety Medications.   Consultants: Dr. Julien Huang with Oncology Procedures performed:  None Disposition: Home Diet recommendation:  Discharge Diet Orders (From admission, onward)     Start     Ordered   03/23/22 0000  Diet - low sodium heart healthy        03/23/22 1612           Cardiac diet  DISCHARGE MEDICATION: Allergies as of 03/23/2022       Reactions   Codeine Nausea Only        Medication List  STOP taking these medications    nicotine 14 mg/24hr patch Commonly known as: NICODERM CQ - dosed in mg/24 hours   nicotine 7 mg/24hr patch Commonly known as: NICODERM CQ - dosed in mg/24 hr   SYSTANE FREE  OP   trolamine salicylate 10 % cream Commonly known as: ASPERCREME       TAKE these medications    acetaminophen 500 MG tablet Commonly known as: TYLENOL Take 500 mg by mouth as needed for moderate pain.   aspirin 81 MG chewable tablet Chew 1 tablet (81 mg total) by mouth daily.   atorvastatin 80 MG tablet Commonly known as: LIPITOR TAKE 1 TABLET BY MOUTH DAILY AT 6 PM. What changed: when to take this   Co Q 10 100 MG Caps Take 100 mg by mouth every evening.   dexamethasone 4 MG tablet Commonly known as: DECADRON Take 1 tablet (4 mg total) by mouth every 6 (six) hours for 6 days, THEN 1 tablet (4 mg total) 3 (three) times daily for 7 days, THEN 1 tablet (4 mg total) 2 (two) times daily for 7 days, THEN 1 tablet (4 mg total) daily for 7 days. Start taking on: March 23, 2022   diclofenac Sodium 1 % Gel Commonly known as: VOLTAREN Apply 1 Application topically daily as needed (Knee pain).   ezetimibe 10 MG tablet Commonly known as: ZETIA TAKE 1 TABLET BY MOUTH EVERY DAY What changed: when to take this   folic acid 1 MG tablet Commonly known as: FOLVITE Take 1 tablet (1 mg total) by mouth daily.   irbesartan 300 MG tablet Commonly known as: AVAPRO Take 1 tablet (300 mg total) by mouth daily. Please contact the office to schedule appointment for additional refills. 1st Attempt. What changed:  when to take this additional instructions   metoprolol tartrate 25 MG tablet Commonly known as: LOPRESSOR TAKE 1 TABLET BY MOUTH TWICE A DAY   nitroGLYCERIN 0.4 MG SL tablet Commonly known as: NITROSTAT Place 1 tablet (0.4 mg total) under the tongue every 5 (five) minutes x 3 doses as needed for chest pain.   ondansetron 8 MG tablet Commonly known as: ZOFRAN Take 1 tablet (8 mg total) by mouth every 8 (eight) hours as needed for nausea or vomiting. Starting day 3 after chemotherapy What changed:  when to take this additional instructions   pantoprazole 40 MG  tablet Commonly known as: Protonix Take 1 tablet (40 mg total) by mouth daily.   prochlorperazine 10 MG tablet Commonly known as: COMPAZINE Take 1 tablet (10 mg total) by mouth every 6 (six) hours as needed for nausea or vomiting. What changed: when to take this   traMADol 50 MG tablet Commonly known as: ULTRAM Take 50 mg by mouth daily as needed for moderate pain.        Follow-up Information     Curt Bears, MD. Go on 04/12/2022.   Specialty: Oncology Why: 9:30AM Contact information: [place] [phone]                 Discharge Exam: Danley Danker Weights   03/22/22 1738  Weight: 86.7 kg    Constitutional: Awake alert and oriented x3, no associated distress.   Respiratory: clear to auscultation bilaterally, no wheezing, no crackles. Normal respiratory effort. No accessory muscle use.  Cardiovascular: Regular rate and rhythm, no murmurs / rubs / gallops. No extremity edema. 2+ pedal pulses. No carotid bruits.  Abdomen: Abdomen is soft and nontender.  No evidence of intra-abdominal masses.  Positive bowel sounds noted in all quadrants.   Musculoskeletal: No joint deformity upper and lower extremities. Good ROM, no contractures. Normal muscle tone.     Condition at discharge: fair  The results of significant diagnostics from this hospitalization (including imaging, microbiology, ancillary and laboratory) are listed below for reference.   Imaging Studies: MR BRAIN W WO CONTRAST  Result Date: 03/22/2022 CLINICAL DATA:  Lung cancer, metastasis to brain, stroke suspected EXAM: MRI HEAD WITHOUT AND WITH CONTRAST TECHNIQUE: Multiplanar, multiecho pulse sequences of the brain and surrounding structures were obtained without and with intravenous contrast. CONTRAST:  8.42mL GADAVIST GADOBUTROL 1 MMOL/ML IV SOLN COMPARISON:  02/05/2022 FINDINGS: Brain: No restricted diffusion to suggest acute or subacute infarct. Again noted are  multiple enhancing lesions, as follows: Left frontal lobe, 11 mm, previously 9 mm, 16:111 Left parietal lobe, 10 mm, previously 9 mm, 16:108 Right occipital lobe, 16 mm, previously 13 mm, 16:83 Left posterior frontal lobe, 3 mm, previously 4 mm, 16:139 Left frontal lobe, 5 mm, previously 5 mm, 16:124 Right posterior frontal lobe,  8 mm, previously 8 mm,16:121 Medial right parietal lobe, 5 mm, previously 5 mm, 16:112 Lateral right occipital lobe, 5 mm, previously 5 mm, 16:63 Medial right occipital lobe, 4 mm, previously 4 mm, 16:62 Left cerebellum, 13 mm, previously 13 mm, 16:41 Right cerebellum, 4 mm, previously 4 mm, 16:41 Left cerebellum, 4 mm, previously 4 mm, 16:32 Increased surrounding edema is associated with the larger lesions. Several punctate lesions noted on the prior exam are not definitively seen on the current study. No significant hemorrhage. No definite hemosiderin deposition associated with the dominant right occipital lesion. No significant mass effect or midline shift. No hydrocephalus or extra-axial collection. Vascular: Normal arterial flow voids. Normal arterial and venous enhancement. Skull and upper cervical spine: Normal marrow signal. Sinuses/Orbits: Clear paranasal sinuses. The orbits are unremarkable. Other: The mastoids are well aerated. IMPRESSION: 1. Slight interval increase in size of several previously noted metastatic lesions, as described above, with increased surrounding edema. No significant mass effect or midline shift. 2. Several punctate lesions noted on the prior exam are not definitively seen on the current study. 3. No evidence of acute or subacute infarct. Electronically Signed   By: Merilyn Baba M.D.   On: 03/22/2022 19:40   CT ANGIO HEAD CODE STROKE  Result Date: 03/22/2022 CLINICAL DATA:  Neuro deficit, acute, stroke suspected EXAM: CT ANGIOGRAPHY HEAD AND NECK TECHNIQUE: Multidetector CT imaging of the head and neck was performed using the standard protocol during  bolus administration of intravenous contrast. Multiplanar CT image reconstructions and MIPs were obtained to evaluate the vascular anatomy. Carotid stenosis measurements (when applicable) are obtained utilizing NASCET criteria, using the distal internal carotid diameter as the denominator. RADIATION DOSE REDUCTION: This exam was performed according to the departmental dose-optimization program which includes automated exposure control, adjustment of the mA and/or kV according to patient size and/or use of iterative reconstruction technique. CONTRAST:  31mL OMNIPAQUE IOHEXOL 350 MG/ML SOLN COMPARISON:  CT head from the same day. FINDINGS: CTA NECK FINDINGS Aortic arch: Great vessel origins are patent. Right carotid system: No evidence of dissection, stenosis (50% or greater), or occlusion. Tortuous ICA. Left carotid system: No evidence of dissection, stenosis (50% or greater), or occlusion. Tortuous ICA. Vertebral arteries: Codominant. No evidence of dissection, stenosis (50% or greater), or occlusion. Skeleton: Multilevel degenerative change. Other neck: No acute findings. Upper chest: Partially imaged left upper lobe mass, better characterized on  prior CT of the chest 01/08/2022. Review of the MIP images confirms the above findings CTA HEAD FINDINGS Anterior circulation: Bilateral intracranial ICAs, MCAs, and ACAs are patent without proximal high-grade stenosis. Approximately 2-3 Conical outpouching arising from the posterior/inferior aspect of the left supraclinoid ICA is favored to represent an infundibulum given small vessel near the tip, although a aneurysm is not excluded. Posterior circulation: Bilateral intradural vertebral arteries elbow basilar artery, and bilateral posterior cerebral arteries are patent without proximal high-grade stenosis. Venous sinuses: As permitted by contrast timing, patent. Review of the MIP images confirms the above findings Brain: No evidence of acute infarction, hemorrhage,  hydrocephalus, extra-axial collection or mass lesion/mass effect. IMPRESSION: 1. No emergent large vessel occlusion or proximal high-grade stenosis. 2. Approximately 2-3 Conical outpouching arising from the posterior/inferior aspect of the left supraclinoid ICA is favored to represent an infundibulum given small vessel near the tip, although a aneurysm is not excluded. A follow-up CTA in 6-12 months could ensure stability if clinically warranted. Partially imaged left upper lobe mass, better characterized on prior CT of the chest 01/08/2022. Electronically Signed   By: Margaretha Sheffield M.D.   On: 03/22/2022 17:48   CT ANGIO NECK CODE STROKE  Result Date: 03/22/2022 CLINICAL DATA:  Neuro deficit, acute, stroke suspected EXAM: CT ANGIOGRAPHY HEAD AND NECK TECHNIQUE: Multidetector CT imaging of the head and neck was performed using the standard protocol during bolus administration of intravenous contrast. Multiplanar CT image reconstructions and MIPs were obtained to evaluate the vascular anatomy. Carotid stenosis measurements (when applicable) are obtained utilizing NASCET criteria, using the distal internal carotid diameter as the denominator. RADIATION DOSE REDUCTION: This exam was performed according to the departmental dose-optimization program which includes automated exposure control, adjustment of the mA and/or kV according to patient size and/or use of iterative reconstruction technique. CONTRAST:  59mL OMNIPAQUE IOHEXOL 350 MG/ML SOLN COMPARISON:  CT head from the same day. FINDINGS: CTA NECK FINDINGS Aortic arch: Great vessel origins are patent. Right carotid system: No evidence of dissection, stenosis (50% or greater), or occlusion. Tortuous ICA. Left carotid system: No evidence of dissection, stenosis (50% or greater), or occlusion. Tortuous ICA. Vertebral arteries: Codominant. No evidence of dissection, stenosis (50% or greater), or occlusion. Skeleton: Multilevel degenerative change. Other neck: No  acute findings. Upper chest: Partially imaged left upper lobe mass, better characterized on prior CT of the chest 01/08/2022. Review of the MIP images confirms the above findings CTA HEAD FINDINGS Anterior circulation: Bilateral intracranial ICAs, MCAs, and ACAs are patent without proximal high-grade stenosis. Approximately 2-3 Conical outpouching arising from the posterior/inferior aspect of the left supraclinoid ICA is favored to represent an infundibulum given small vessel near the tip, although a aneurysm is not excluded. Posterior circulation: Bilateral intradural vertebral arteries elbow basilar artery, and bilateral posterior cerebral arteries are patent without proximal high-grade stenosis. Venous sinuses: As permitted by contrast timing, patent. Review of the MIP images confirms the above findings Brain: No evidence of acute infarction, hemorrhage, hydrocephalus, extra-axial collection or mass lesion/mass effect. IMPRESSION: 1. No emergent large vessel occlusion or proximal high-grade stenosis. 2. Approximately 2-3 Conical outpouching arising from the posterior/inferior aspect of the left supraclinoid ICA is favored to represent an infundibulum given small vessel near the tip, although a aneurysm is not excluded. A follow-up CTA in 6-12 months could ensure stability if clinically warranted. Partially imaged left upper lobe mass, better characterized on prior CT of the chest 01/08/2022. Electronically Signed   By: Jamesetta So.D.  On: 03/22/2022 17:48   CT HEAD CODE STROKE WO CONTRAST  Result Date: 03/22/2022 CLINICAL DATA:  Code stroke. EXAM: CT HEAD WITHOUT CONTRAST TECHNIQUE: Contiguous axial images were obtained from the base of the skull through the vertex without intravenous contrast. RADIATION DOSE REDUCTION: This exam was performed according to the departmental dose-optimization program which includes automated exposure control, adjustment of the mA and/or kV according to patient size  and/or use of iterative reconstruction technique. COMPARISON:  MRI February 05, 2022. FINDINGS: Brain: Multiple lesions throughout bilateral cerebral hemispheres, compatible with metastases better characterized on prior MRI. Small focus of hemorrhage in the region of a probable lesion in the right parieto-occipital region may represent interval hemorrhage of the metastasis. No definite evidence of acute large vascular territory infarct; however, the above metastases limited assessment. No midline shift. No hydrocephalus. Vascular: No hyperdense vessel identified. Skull: No acute fracture. Sinuses/Orbits: Clear sinuses.  No acute orbital findings. IMPRESSION: 1. Multiple lesions throughout bilateral cerebral hemispheres, compatible with metastases better characterized on prior MRI. Small focus of hemorrhage in the region of a probable right parieto-occipital metastatic lesion, possibly interval hemorrhage of the metastasis. MRI with contrast could better assess if clinically warranted. 2. No definite evidence of acute large vascular territory infarct; however, the above metastases limited assessment. MRI could further evaluate. Findings discussed with Dr. Maryan Rued via telephone at 5:19 p.m. Electronically Signed   By: Margaretha Sheffield M.D.   On: 03/22/2022 17:21    Microbiology: Results for orders placed or performed in visit on 01/13/22  Novel Coronavirus, NAA ([facility])     Status: None   Collection Time: 01/13/22  6:49 PM   Specimen: Nasopharyngeal(NP) swabs in vial transport medium   Nasopharynge  Previous  Result Value Ref Range Status   SARS-CoV-2, NAA Not Detected Not Detected Final    Comment: This nucleic acid amplification test was developed and its performance characteristics determined by [employer]. Nucleic acid amplification tests include RT-PCR and TMA. This test has not been FDA cleared or approved. This test has been authorized by FDA under an Emergency Use Authorization (EUA).  This test is only authorized for the duration of time the declaration that circumstances exist justifying the authorization of the emergency use of in vitro diagnostic tests for detection of SARS-CoV-2 virus and/or diagnosis of COVID-19 infection under section 564(b)(1) of the Act, 21 U.S.C. 595GLO-7(F) (1), unless the authorization is terminated or revoked sooner. When diagnostic testing is negative, the possibility of a false negative result should be considered in the context of a patient's recent exposures and the presence of clinical signs and symptoms consistent with COVID-19. An individual without symptoms of COVID-19 and who is not shedding SARS-CoV-2 virus wo uld expect to have a negative (not detected) result in this assay.     Labs: CBC: Recent Labs  Lab 03/22/22 1325 03/22/22 1704 03/22/22 1708 03/23/22 0614  WBC 7.4  --  6.9 6.8  NEUTROABS 5.1  --  5.0  --   HGB 13.9 13.6 13.3 13.3  HCT 40.0 40.0 40.1 40.0  MCV 89.3  --  92.6 90.1  PLT 286  --  285 643   Basic Metabolic Panel: Recent Labs  Lab 03/22/22 1325 03/22/22 1704 03/22/22 1708 03/23/22 0614  NA 138 138 137 137  K 4.0 3.8 3.7 3.8  CL 104 104 105 106  CO2 26  --  25 22  GLUCOSE 144* 148* 152* 147*  BUN 10 9 11 10   CREATININE 0.68 0.60 0.74  0.66  CALCIUM 9.4  --  8.4* 8.8*   Liver Function Tests: Recent Labs  Lab 03/22/22 1325 03/22/22 1708 03/23/22 0614  AST 21 27 24   ALT 22 26 25   ALKPHOS 91 81 78  BILITOT 0.4 0.7 0.6  PROT 7.6 7.6 7.3  ALBUMIN 4.2 3.7 3.6   CBG: No results for input(s): "GLUCAP" in the last 168 hours.  Discharge time spent: greater than 30 minutes.  Signed: Vernelle Emerald, MD [facility] 03/23/2022

## 2022-03-23 NOTE — Hospital Course (Addendum)
71 year old female with past medical history of coronary artery disease status post STEMI (01/2017, cath revealed severe 2 vessel disease - S/P DES to left circuflex), hyperlipidemia, hypertension, nicotine dependence on cigarettes, and stage IV [name] of the lung (Dx 01/2022, follows with Dr. [name]) with mets to the brain (S/P [facility] 02/24/2022) receiving palliative chemotherapy with carboplatin, Alimta and Keytruda.  Patient presented to [facility] emergency department from the oncology clinic due to concerns for left-sided facial droop and garbled speech that began mid chemotherapy infusion.  This was stopped and the patient was brought over for evaluation.  Upon evaluation in the emergency department no tPA was administered.  Teleneurology consult was obtained.  MRI brain with and without contrast revealed no evidence of acute stroke but did reveal slight interval increase in size of previously noted metastatic lesions.  Neurologic symptoms were felt to be secondary to extension of known metastatic disease.  The hospitalist group was contacted and the patient was admitted to the medical service on systemic steroid therapy.  Patient dramatically clinically improved overnight and neurologically returned to baseline by the following day.  Upon reassessment, it was deemed that patient was safe to be discharged home on a slow taper of oral dexamethasone therapy.  Patient was additionally prescribed a home-going regimen of prophylactic Protonix over the course of this taper.  Patient was discharged in improved and stable condition and was advised to follow-up closely as an outpatient with Dr. [name] her oncologist.

## 2022-03-23 NOTE — Progress Notes (Signed)
                                                                                                                                                             Patient Name: Kimberly Huang MRN: [id number] DOB: June 19, 1950 Referring Physician: [name] Date of Service: 02/24/2022 [facility], [place]                                                        End Of Treatment Note  Diagnoses: C79.31-Secondary malignant neoplasm of brain  Cancer Staging:  Cancer Staging  Adenocarcinoma of left lung, stage 4 ([place]) Staging form: Lung, AJCC 8th Edition - Clinical: Stage IVB (cT1c, cN2, cM1c) - Signed by Curt Bears, MD on 02/08/2022  Intent: Palliative  Radiation Treatment Dates: 02/24/2022 through 02/24/2022 Site Technique Total Dose (Gy) Dose per Fx (Gy) Completed Fx Beam Energies  Brain: Brain IMRT 20/20 20 1/1 [id number]   Narrative: The patient tolerated radiation therapy relatively well.   Plan: The patient will follow-up with radiation oncology in 13mo.  -----------------------------------  Eppie Gibson, MD

## 2022-03-23 NOTE — Progress Notes (Signed)
PT Cancellation Note  Patient Details Name: Kimberly Huang MRN: [id number] DOB: 06/12/1950   Cancelled Treatment:    Reason Eval/Treat Not Completed: PT screened, no needs identified, will sign off, patient states that she is going home, has been up ambulating, declines any PT needs at this time. [place] Office [phone] Weekend [phone]    Claretha Cooper 03/23/2022, 3:52 PM

## 2022-03-23 NOTE — Evaluation (Addendum)
Occupational Therapy Evaluation Patient Details Name: Kimberly Huang MRN: [id number] DOB: Jun 04, 1950 Today's Date: 03/23/2022   History of Present Illness Patient is a 71 year old female who presented with dysarthria, left side facial droop and visual changes after palliative chemotherapy dose. Patient was found to have vasogenic edema with acute neurologic symptoms.     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 Patient was living at home at cane level  with son and daughter in law with other son and daughter in law living near by offering support as well. Patient  was noted to require min guard for ADLs with some instability and balance deficits noted during toileting tasks and functional mobility in room. Patient was able to maintain O2 saturation above 96% on RA during ADLs in room. Nurse made aware. Patient reported family would be present if she needed more support at home at time of d/c. Patient would continue to benefit from skilled OT services at this time while admitted  to address noted deficits in order to improve overall safety and independence in ADLs.       Recommendations for follow up therapy are one component of a multi-disciplinary discharge planning process, led by the attending physician.  Recommendations may be updated based on patient status, additional functional criteria and insurance authorization.   Follow Up Recommendations  No OT follow up     Assistance Recommended at Discharge Intermittent Supervision/Assistance  Patient can return home with the following A little help with walking and/or transfers;A little help with bathing/dressing/bathroom;Direct supervision/assist for financial management;Help with stairs or ramp for entrance;Direct supervision/assist for  medications management;Assist for transportation;Assistance with cooking/housework    Functional Status Assessment  Patient has had a recent decline in their functional status and demonstrates the ability to make significant improvements in function in a reasonable and predictable amount of time.  Equipment Recommendations  None recommended by OT    Recommendations for Other Services       Precautions / Restrictions Precautions Precautions: Fall Precaution Comments: monitor O2 Restrictions Weight Bearing Restrictions: No      Mobility Bed Mobility Overal bed mobility: Needs Assistance Bed Mobility: Supine to Sit     Supine to sit: Supervision, HOB elevated     General bed mobility comments: with increased time    Transfers                          Balance Overall balance assessment: Mild deficits observed, not formally tested                                         ADL either performed or assessed with clinical judgement   ADL Overall ADL's : Needs assistance/impaired Eating/Feeding: Set up;Sitting   Grooming: Min guard;Wash/dry face;Wash/dry hands;Standing Grooming Details (indicate cue type and reason): at sink Upper Body Bathing: Set up;Sitting   Lower Body Bathing: Set up;Sitting/lateral leans Lower Body Bathing Details (indicate cue type and reason): was able to complete simulated task with figure four positioning of BLE Upper Body Dressing : Min guard;Sitting   Lower Body Dressing: Min guard;Sitting/lateral leans   Toilet Transfer: Ambulation;Min guard Toilet Transfer Details (indicate cue type and reason): patient  was abe to transfer from edge of bed to bathroom with cane with noted furniture walking into bathroom. patient was educated on not holding onto furniture. patient verbalized understanding. patient was min guard to transfer from toilet back to recliner in room with increased time and no holding onto furniture  noted. Toileting- Clothing Manipulation and Hygiene: Sitting/lateral lean;Min guard               Vision   Additional Comments: patient reported having to blink alot during session. no apparent visual deficits noted during session.     Perception     Praxis      Pertinent Vitals/Pain Pain Assessment Pain Assessment: No/denies pain     Hand Dominance     Extremity/Trunk Assessment Upper Extremity Assessment Upper Extremity Assessment: Overall WFL for tasks assessed (no deficits in ROM or strength noted.)   Lower Extremity Assessment Lower Extremity Assessment: Defer to PT evaluation   Cervical / Trunk Assessment Cervical / Trunk Assessment: Normal   Communication Communication Communication: No difficulties   Cognition Arousal/Alertness: Awake/alert Behavior During Therapy: WFL for tasks assessed/performed Overall Cognitive Status: Difficult to assess                                 General Comments: patient was very tangential during session with consistent talking. patient's daughter in law was present in room.     General Comments       Exercises     Shoulder Instructions      Home Living Family/patient expects to be discharged to:: Private residence Living Arrangements: Children (son and daughter in law) Available Help at Discharge: Family;Available 24 hours/day Type of Home: House Home Access: Stairs to enter [employer] of Steps: 6 Entrance Stairs-Rails: Can reach both Home Layout: Two level Alternate Level Stairs-Number of Steps: 15 Alternate Level Stairs-Rails: Can reach both           Home Equipment: Cane - single point;Rolling Walker (2 wheels)          Prior Functioning/Environment Prior Level of Function : Independent/Modified Independent                        OT Problem List: Impaired balance (sitting and/or standing);Decreased safety awareness;Decreased knowledge of  precautions;Cardiopulmonary status limiting activity      OT Treatment/Interventions: Self-care/ADL training;Therapeutic exercise;Neuromuscular education;DME and/or AE instruction;Therapeutic activities;Patient/family education;Balance training    OT Goals(Current goals can be found in the care plan section) Acute Rehab OT Goals Patient Stated Goal: to go home today OT Goal Formulation: With patient/family Time For Goal Achievement: 04/06/22 Potential to Achieve Goals: Fair  OT Frequency: Min 2X/week    Co-evaluation              AM-PAC OT "6 Clicks" Daily Activity     Outcome Measure Help from another person eating meals?: None Help from another person taking care of personal grooming?: A Little Help from another person toileting, which includes using toliet, bedpan, or urinal?: A Little Help from another person bathing (including washing, rinsing, drying)?: A Little Help from another person to put on and taking off regular upper body clothing?: A Little Help from another person to put on and taking off regular lower body clothing?: A Little 6 Click Score: 19   End of Session Equipment Utilized During Treatment: Gait belt;Other (comment) (personal cane) Nurse Communication: Other (comment) (patients O2 during session)  Activity Tolerance:   Patient left: in chair;with call bell/phone within reach;with family/visitor present  OT Visit Diagnosis: Unsteadiness on feet (R26.81);Other abnormalities of gait and mobility (R26.89)                Time: 3967-2897 OT Time Calculation (min): 29 min Charges:  OT General Charges $OT Visit: 1 Visit OT Evaluation $OT Eval Low Complexity: 1 Low OT Treatments $Self Care/Home Management : 8-22 mins  Rennie Plowman, MS Acute Rehabilitation Department Office# [phone]   Feliz Beam Aleyna Cueva 03/23/2022, 1:18 PM

## 2022-03-25 ENCOUNTER — Other Ambulatory Visit: Payer: Self-pay | Admitting: Cardiology

## 2022-03-27 ENCOUNTER — Other Ambulatory Visit: Payer: Self-pay

## 2022-03-29 ENCOUNTER — Telehealth: Payer: Self-pay | Admitting: *Deleted

## 2022-03-29 NOTE — Telephone Encounter (Signed)
Attempted to reach patient to get her scheduled to see Dr [name] per request of Dr [name] & Dr Leonel Ramsay.  Left message pending returned call.

## 2022-03-31 ENCOUNTER — Ambulatory Visit
Admission: RE | Admit: 2022-03-31 | Discharge: 2022-03-31 | Disposition: A | Discharge status: Home / Self Care | Payer: Medicare Other | Source: Ambulatory Visit | Attending: Radiation Oncology | Admitting: Radiation Oncology

## 2022-03-31 ENCOUNTER — Encounter: Payer: Self-pay | Admitting: Radiation Oncology

## 2022-03-31 ENCOUNTER — Other Ambulatory Visit: Payer: Self-pay

## 2022-03-31 ENCOUNTER — Other Ambulatory Visit: Payer: Self-pay | Admitting: Radiation Therapy

## 2022-03-31 VITALS — BP 147/73 | HR 65 | Temp 98.4°F | Resp 18 | Ht 62.0 in | Wt 188.8 lb

## 2022-03-31 DIAGNOSIS — C7931 Secondary malignant neoplasm of brain: Secondary | ICD-10-CM | POA: Diagnosis present

## 2022-03-31 DIAGNOSIS — Z923 Personal history of irradiation: Secondary | ICD-10-CM | POA: Insufficient documentation

## 2022-03-31 DIAGNOSIS — Z7952 Long term (current) use of systemic steroids: Secondary | ICD-10-CM | POA: Insufficient documentation

## 2022-03-31 DIAGNOSIS — Z7982 Long term (current) use of aspirin: Secondary | ICD-10-CM | POA: Insufficient documentation

## 2022-03-31 DIAGNOSIS — Z79899 Other long term (current) drug therapy: Secondary | ICD-10-CM | POA: Diagnosis not present

## 2022-03-31 DIAGNOSIS — C3412 Malignant neoplasm of upper lobe, left bronchus or lung: Secondary | ICD-10-CM | POA: Insufficient documentation

## 2022-03-31 NOTE — Progress Notes (Signed)
Radiation Oncology         [phone]) [phone] ________________________________  Name: Kimberly Huang MRN: [id number]  Date: 03/31/2022  DOB: 1950-06-29  Follow-Up Visit Note  Outpatient  CC: Patient, No Pcp Per  [name], MD  Diagnosis:   Stage IV adenocarcinoma lung cancer with metastasis to the brain     ICD-10-CM   1. Metastasis to brain [facility])  C79.31       CHIEF COMPLAINT: Here for follow-up and surveillance of multiple brain metastasis   Interval Since Last Radiation:  1 month  Radiation Treatment Dates: 02/24/2022 Site Technique Total Dose (Gy) Dose per Fx (Gy) Completed Fx Beam Energies  Brain: Brain IMRT 20/20 20 1/1 6XFFF   Narrative:  The patient returns today for routine follow-up.         Most recent MRI of the brain on 03/22/2022 demonstrates slight interval increase in size of metastatic increase with increased surrounding edema. These are typical changes consistent with being 1 month out from radiosurgery.   Symptomatically, denies  seizures, headaches, nausea, new neurologic deficits, visual changes. Patient complains of the increased need to blink in her left eye. She states she feels like there is a film over her eye.    Medical oncology's plans for the patient are to continue chemotherapy treatment.  She has completed 2 cycles of chemotherapy thus far. Of note, she was sent to the ED with concerns of stroke after her last infusion. No evidence of stroke was seen on home and she was sent home with instructions to follow closely with Dr. [name].  She is essentially feeling back to normal now.  She does have follow-up in the near future with Dr. [name].  She is tapering dexamethasone per hospitalist instructions  ALLERGIES:  is allergic to codeine.  Meds: Current Outpatient Medications  Medication Sig Dispense Refill   acetaminophen (TYLENOL) 500 MG tablet Take 500 mg by mouth as needed for moderate pain.     aspirin 81 MG chewable tablet Chew 1 tablet  (81 mg total) by mouth daily.     atorvastatin (LIPITOR) 80 MG tablet TAKE 1 TABLET BY MOUTH DAILY AT 6 PM. (Patient taking differently: Take 80 mg by mouth daily.) 90 tablet 3   Coenzyme Q10 (CO Q 10) 100 MG CAPS Take 100 mg by mouth every evening.     dexamethasone (DECADRON) 4 MG tablet Take 1 tablet (4 mg total) by mouth every 6 (six) hours for 6 days, THEN 1 tablet (4 mg total) 3 (three) times daily for 7 days, THEN 1 tablet (4 mg total) 2 (two) times daily for 7 days, THEN 1 tablet (4 mg total) daily for 7 days. 66 tablet 0   diclofenac Sodium (VOLTAREN) 1 % GEL Apply 1 Application topically daily as needed (Knee pain).     ezetimibe (ZETIA) 10 MG tablet TAKE 1 TABLET BY MOUTH EVERY DAY 90 tablet 0   folic acid (FOLVITE) 1 MG tablet Take 1 tablet (1 mg total) by mouth daily. 30 tablet 4   irbesartan (AVAPRO) 300 MG tablet TAKE 1 TABLET BY MOUTH DAILY. PLEASE CONTACT THE OFFICE TO SCHEDULE APPOINTMENT 90 tablet 0   metoprolol tartrate (LOPRESSOR) 25 MG tablet TAKE 1 TABLET BY MOUTH TWICE A DAY 180 tablet 0   nitroGLYCERIN (NITROSTAT) 0.4 MG SL tablet Place 1 tablet (0.4 mg total) under the tongue every 5 (five) minutes x 3 doses as needed for chest pain. 25 tablet 2   ondansetron (ZOFRAN) 8  MG tablet Take 1 tablet (8 mg total) by mouth every 8 (eight) hours as needed for nausea or vomiting. Starting day 3 after chemotherapy (Patient taking differently: Take 8 mg by mouth as needed for nausea or vomiting.) 30 tablet 2   pantoprazole (PROTONIX) 40 MG tablet Take 1 tablet (40 mg total) by mouth daily. 30 tablet 0   prochlorperazine (COMPAZINE) 10 MG tablet Take 1 tablet (10 mg total) by mouth every 6 (six) hours as needed for nausea or vomiting. (Patient taking differently: Take 10 mg by mouth as needed for nausea or vomiting.) 30 tablet 2   traMADol (ULTRAM) 50 MG tablet Take 50 mg by mouth daily as needed for moderate pain.     No current facility-administered medications for this encounter.     Physical Findings: The patient is in no acute distress. Patient is alert and oriented.  height is 5\' 2"  (1.575 m) and weight is 188 lb 12.8 oz (85.6 kg). Her temperature is 98.4 F (36.9 C). Her blood pressure is 147/73 (abnormal) and her pulse is 65. Her respiration is 18 and oxygen saturation is 99%. .  No significant changes. Neurologic exam is nonfocal.  She is alert and oriented Psychiatric exam reveals pleasant affect, judgment and insight intact  KPS = 80  100 - Normal; no complaints; no evidence of disease. 90   - Able to carry on normal activity; minor signs or symptoms of disease. 80   - Normal activity with effort; some signs or symptoms of disease. 34   - Cares for self; unable to carry on normal activity or to do active work. 60   - Requires occasional assistance, but is able to care for most of his personal needs. 50   - Requires considerable assistance and frequent medical care. 97   - Disabled; requires special care and assistance. 24   - Severely disabled; hospital admission is indicated although death not imminent. 26   - Very sick; hospital admission necessary; active supportive treatment necessary. 10   - Moribund; fatal processes progressing rapidly. 0     - Dead  Karnofsky DA, Abelmann [place], Craver LS and Burchenal [facility] [phone]) The use of the nitrogen mustards in the palliative treatment of carcinoma: with particular reference to bronchogenic carcinoma Cancer 1 634-56  Lab Findings: Lab Results  Component Value Date   WBC 6.8 03/23/2022   HGB 13.3 03/23/2022   HCT 40.0 03/23/2022   MCV 90.1 03/23/2022   PLT 284 03/23/2022    Radiographic Findings: MR BRAIN W WO CONTRAST  Result Date: 03/22/2022 CLINICAL DATA:  Lung cancer, metastasis to brain, stroke suspected EXAM: MRI HEAD WITHOUT AND WITH CONTRAST TECHNIQUE: Multiplanar, multiecho pulse sequences of the brain and surrounding structures were obtained without and with intravenous contrast. CONTRAST:  8.37mL  GADAVIST GADOBUTROL 1 MMOL/ML IV SOLN COMPARISON:  02/05/2022 FINDINGS: Brain: No restricted diffusion to suggest acute or subacute infarct. Again noted are multiple enhancing lesions, as follows: Left frontal lobe, 11 mm, previously 9 mm, 16:111 Left parietal lobe, 10 mm, previously 9 mm, 16:108 Right occipital lobe, 16 mm, previously 13 mm, 16:83 Left posterior frontal lobe, 3 mm, previously 4 mm, 16:139 Left frontal lobe, 5 mm, previously 5 mm, 16:124 Right posterior frontal lobe,  8 mm, previously 8 mm,16:121 Medial right parietal lobe, 5 mm, previously 5 mm, 16:112 Lateral right occipital lobe, 5 mm, previously 5 mm, 16:63 Medial right occipital lobe, 4 mm, previously 4 mm, 16:62 Left cerebellum, 13 mm, previously 13  mm, 16:41 Right cerebellum, 4 mm, previously 4 mm, 16:41 Left cerebellum, 4 mm, previously 4 mm, 16:32 Increased surrounding edema is associated with the larger lesions. Several punctate lesions noted on the prior exam are not definitively seen on the current study. No significant hemorrhage. No definite hemosiderin deposition associated with the dominant right occipital lesion. No significant mass effect or midline shift. No hydrocephalus or extra-axial collection. Vascular: Normal arterial flow voids. Normal arterial and venous enhancement. Skull and upper cervical spine: Normal marrow signal. Sinuses/Orbits: Clear paranasal sinuses. The orbits are unremarkable. Other: The mastoids are well aerated. IMPRESSION: 1. Slight interval increase in size of several previously noted metastatic lesions, as described above, with increased surrounding edema. No significant mass effect or midline shift. 2. Several punctate lesions noted on the prior exam are not definitively seen on the current study. 3. No evidence of acute or subacute infarct. Electronically Signed   By: Merilyn Baba M.D.   On: 03/22/2022 19:40   CT ANGIO HEAD CODE STROKE  Result Date: 03/22/2022 CLINICAL DATA:  Neuro deficit, acute,  stroke suspected EXAM: CT ANGIOGRAPHY HEAD AND NECK TECHNIQUE: Multidetector CT imaging of the head and neck was performed using the standard protocol during bolus administration of intravenous contrast. Multiplanar CT image reconstructions and MIPs were obtained to evaluate the vascular anatomy. Carotid stenosis measurements (when applicable) are obtained utilizing NASCET criteria, using the distal internal carotid diameter as the denominator. RADIATION DOSE REDUCTION: This exam was performed according to the departmental dose-optimization program which includes automated exposure control, adjustment of the mA and/or kV according to patient size and/or use of iterative reconstruction technique. CONTRAST:  19mL OMNIPAQUE IOHEXOL 350 MG/ML SOLN COMPARISON:  CT head from the same day. FINDINGS: CTA NECK FINDINGS Aortic arch: Great vessel origins are patent. Right carotid system: No evidence of dissection, stenosis (50% or greater), or occlusion. Tortuous ICA. Left carotid system: No evidence of dissection, stenosis (50% or greater), or occlusion. Tortuous ICA. Vertebral arteries: Codominant. No evidence of dissection, stenosis (50% or greater), or occlusion. Skeleton: Multilevel degenerative change. Other neck: No acute findings. Upper chest: Partially imaged left upper lobe mass, better characterized on prior CT of the chest 01/08/2022. Review of the MIP images confirms the above findings CTA HEAD FINDINGS Anterior circulation: Bilateral intracranial ICAs, MCAs, and ACAs are patent without proximal high-grade stenosis. Approximately 2-3 Conical outpouching arising from the posterior/inferior aspect of the left supraclinoid ICA is favored to represent an infundibulum given small vessel near the tip, although a aneurysm is not excluded. Posterior circulation: Bilateral intradural vertebral arteries elbow basilar artery, and bilateral posterior cerebral arteries are patent without proximal high-grade stenosis. Venous  sinuses: As permitted by contrast timing, patent. Review of the MIP images confirms the above findings Brain: No evidence of acute infarction, hemorrhage, hydrocephalus, extra-axial collection or mass lesion/mass effect. IMPRESSION: 1. No emergent large vessel occlusion or proximal high-grade stenosis. 2. Approximately 2-3 Conical outpouching arising from the posterior/inferior aspect of the left supraclinoid ICA is favored to represent an infundibulum given small vessel near the tip, although a aneurysm is not excluded. A follow-up CTA in 6-12 months could ensure stability if clinically warranted. Partially imaged left upper lobe mass, better characterized on prior CT of the chest 01/08/2022. Electronically Signed   By: Margaretha Sheffield M.D.   On: 03/22/2022 17:48   CT ANGIO NECK CODE STROKE  Result Date: 03/22/2022 CLINICAL DATA:  Neuro deficit, acute, stroke suspected EXAM: CT ANGIOGRAPHY HEAD AND NECK TECHNIQUE: Multidetector CT imaging  of the head and neck was performed using the standard protocol during bolus administration of intravenous contrast. Multiplanar CT image reconstructions and MIPs were obtained to evaluate the vascular anatomy. Carotid stenosis measurements (when applicable) are obtained utilizing NASCET criteria, using the distal internal carotid diameter as the denominator. RADIATION DOSE REDUCTION: This exam was performed according to the departmental dose-optimization program which includes automated exposure control, adjustment of the mA and/or kV according to patient size and/or use of iterative reconstruction technique. CONTRAST:  45mL OMNIPAQUE IOHEXOL 350 MG/ML SOLN COMPARISON:  CT head from the same day. FINDINGS: CTA NECK FINDINGS Aortic arch: Great vessel origins are patent. Right carotid system: No evidence of dissection, stenosis (50% or greater), or occlusion. Tortuous ICA. Left carotid system: No evidence of dissection, stenosis (50% or greater), or occlusion. Tortuous ICA.  Vertebral arteries: Codominant. No evidence of dissection, stenosis (50% or greater), or occlusion. Skeleton: Multilevel degenerative change. Other neck: No acute findings. Upper chest: Partially imaged left upper lobe mass, better characterized on prior CT of the chest 01/08/2022. Review of the MIP images confirms the above findings CTA HEAD FINDINGS Anterior circulation: Bilateral intracranial ICAs, MCAs, and ACAs are patent without proximal high-grade stenosis. Approximately 2-3 Conical outpouching arising from the posterior/inferior aspect of the left supraclinoid ICA is favored to represent an infundibulum given small vessel near the tip, although a aneurysm is not excluded. Posterior circulation: Bilateral intradural vertebral arteries elbow basilar artery, and bilateral posterior cerebral arteries are patent without proximal high-grade stenosis. Venous sinuses: As permitted by contrast timing, patent. Review of the MIP images confirms the above findings Brain: No evidence of acute infarction, hemorrhage, hydrocephalus, extra-axial collection or mass lesion/mass effect. IMPRESSION: 1. No emergent large vessel occlusion or proximal high-grade stenosis. 2. Approximately 2-3 Conical outpouching arising from the posterior/inferior aspect of the left supraclinoid ICA is favored to represent an infundibulum given small vessel near the tip, although a aneurysm is not excluded. A follow-up CTA in 6-12 months could ensure stability if clinically warranted. Partially imaged left upper lobe mass, better characterized on prior CT of the chest 01/08/2022. Electronically Signed   By: Margaretha Sheffield M.D.   On: 03/22/2022 17:48   CT HEAD CODE STROKE WO CONTRAST  Result Date: 03/22/2022 CLINICAL DATA:  Code stroke. EXAM: CT HEAD WITHOUT CONTRAST TECHNIQUE: Contiguous axial images were obtained from the base of the skull through the vertex without intravenous contrast. RADIATION DOSE REDUCTION: This exam was performed  according to the departmental dose-optimization program which includes automated exposure control, adjustment of the mA and/or kV according to patient size and/or use of iterative reconstruction technique. COMPARISON:  MRI February 05, 2022. FINDINGS: Brain: Multiple lesions throughout bilateral cerebral hemispheres, compatible with metastases better characterized on prior MRI. Small focus of hemorrhage in the region of a probable lesion in the right parieto-occipital region may represent interval hemorrhage of the metastasis. No definite evidence of acute large vascular territory infarct; however, the above metastases limited assessment. No midline shift. No hydrocephalus. Vascular: No hyperdense vessel identified. Skull: No acute fracture. Sinuses/Orbits: Clear sinuses.  No acute orbital findings. IMPRESSION: 1. Multiple lesions throughout bilateral cerebral hemispheres, compatible with metastases better characterized on prior MRI. Small focus of hemorrhage in the region of a probable right parieto-occipital metastatic lesion, possibly interval hemorrhage of the metastasis. MRI with contrast could better assess if clinically warranted. 2. No definite evidence of acute large vascular territory infarct; however, the above metastases limited assessment. MRI could further evaluate. Findings discussed with Dr.  PLUNKETT via telephone at 5:19 p.m. Electronically Signed   By: Margaretha Sheffield M.D.   On: 03/22/2022 17:21    Impression/Plan: I personally reviewed the recent imaging. I am pleased with the most recent as they are consistent with typical post-radiation therapy changes. No evidence of disease progression on clinical exam today.  Next MRI is scheduled for 05/26/22 -the patient will continue to follow with Dr. [name] and neurosurgery.  I will see her later next year for her subsequent MRI review.  On date of service, in total, I spent 20 minutes on this encounter. Patient was seen in person.   _____________________________________   Leona Singleton, PA   Eppie Gibson, MD

## 2022-04-01 ENCOUNTER — Other Ambulatory Visit: Payer: Self-pay

## 2022-04-02 ENCOUNTER — Encounter: Payer: Self-pay | Admitting: Radiation Oncology

## 2022-04-02 ENCOUNTER — Other Ambulatory Visit: Payer: Self-pay

## 2022-04-06 ENCOUNTER — Inpatient Hospital Stay: Payer: Medicare Other | Attending: Radiation Oncology | Admitting: Internal Medicine

## 2022-04-06 VITALS — BP 140/75 | HR 70 | Temp 98.3°F | Resp 16 | Ht 62.0 in | Wt 193.6 lb

## 2022-04-06 DIAGNOSIS — R4781 Slurred speech: Secondary | ICD-10-CM | POA: Diagnosis not present

## 2022-04-06 DIAGNOSIS — C3412 Malignant neoplasm of upper lobe, left bronchus or lung: Secondary | ICD-10-CM | POA: Insufficient documentation

## 2022-04-06 DIAGNOSIS — R569 Unspecified convulsions: Secondary | ICD-10-CM | POA: Diagnosis not present

## 2022-04-06 DIAGNOSIS — Z5112 Encounter for antineoplastic immunotherapy: Secondary | ICD-10-CM | POA: Diagnosis present

## 2022-04-06 DIAGNOSIS — Z5111 Encounter for antineoplastic chemotherapy: Secondary | ICD-10-CM | POA: Diagnosis present

## 2022-04-06 DIAGNOSIS — C7931 Secondary malignant neoplasm of brain: Secondary | ICD-10-CM | POA: Diagnosis not present

## 2022-04-06 DIAGNOSIS — Z801 Family history of malignant neoplasm of trachea, bronchus and lung: Secondary | ICD-10-CM | POA: Diagnosis not present

## 2022-04-06 DIAGNOSIS — Z720 Tobacco use: Secondary | ICD-10-CM | POA: Insufficient documentation

## 2022-04-06 DIAGNOSIS — R29810 Facial weakness: Secondary | ICD-10-CM | POA: Diagnosis not present

## 2022-04-06 MED ORDER — LEVETIRACETAM 500 MG PO TABS: 500.0000 mg | ORAL_TABLET | Freq: Two times a day (BID) | ORAL | 2 refills | Status: DC

## 2022-04-06 NOTE — Progress Notes (Signed)
Kimberly Huang at [place], [place] [address] [phone]   New Patient Evaluation  Date of Service: 04/06/22 Patient Name: Kimberly Huang Patient MRN: [id number] Patient DOB: 04-17-1951 Provider: Ventura Sellers, MD  Identifying Statement:  Kimberly Huang is a 71 y.o. female with Metastatic cancer to brain [facility]) who presents for initial consultation and evaluation regarding cancer associated neurologic deficits.    Referring Provider: Curt Bears, MD [address] [place],  [place] [address]  Primary Cancer:  Oncologic History: Oncology History  Adenocarcinoma of left lung, stage 4 (HCC)  02/08/2022 Initial Diagnosis   Adenocarcinoma of left lung, stage 4 ([place])   02/08/2022 Cancer Staging   Staging form: Lung, AJCC 8th Edition - Clinical: Stage IVB (cT1c, cN2, cM1c) - Signed by Curt Bears, MD on 02/08/2022   03/01/2022 -  Chemotherapy   Patient is on Treatment Plan : LUNG Carboplatin (5) + Pemetrexed (500) + Pembrolizumab (200) D1 q21d Induction x 4 cycles / Maintenance Pemetrexed (500) + Pembrolizumab (200) D1 q21d      CNS Oncologic History 02/24/22: SRS to >12 CNS metastases [name])  History of Present Illness: The patient's records from the referring physician were obtained and reviewed and the patient interviewed to confirm this HPI.  Kimberly Huang presented to neurologic attention on 03/22/22, when she experienced sudden onset slurred speech and left facial weakness while undergoing chemotherapy infusion for lung cancer.  She has poor recall of the events, and describes impaired awareness and memory for 1-2 hours following the episode.  She returned to baseline neurologic function thereafter.  Stroke workup in the hospital was unremarkable.  No recurrence of symptoms, she maintains dose of 12mg  daily dexamethasone.  Medications: Current Outpatient Medications on File Prior to Visit  Medication Sig  Dispense Refill   acetaminophen (TYLENOL) 500 MG tablet Take 500 mg by mouth as needed for moderate pain.     aspirin 81 MG chewable tablet Chew 1 tablet (81 mg total) by mouth daily.     atorvastatin (LIPITOR) 80 MG tablet TAKE 1 TABLET BY MOUTH DAILY AT 6 PM. (Patient taking differently: Take 80 mg by mouth daily.) 90 tablet 3   Coenzyme Q10 (CO Q 10) 100 MG CAPS Take 100 mg by mouth every evening.     dexamethasone (DECADRON) 4 MG tablet Take 1 tablet (4 mg total) by mouth every 6 (six) hours for 6 days, THEN 1 tablet (4 mg total) 3 (three) times daily for 7 days, THEN 1 tablet (4 mg total) 2 (two) times daily for 7 days, THEN 1 tablet (4 mg total) daily for 7 days. 66 tablet 0   diclofenac Sodium (VOLTAREN) 1 % GEL Apply 1 Application topically daily as needed (Knee pain).     ezetimibe (ZETIA) 10 MG tablet TAKE 1 TABLET BY MOUTH EVERY DAY 90 tablet 0   folic acid (FOLVITE) 1 MG tablet Take 1 tablet (1 mg total) by mouth daily. 30 tablet 4   irbesartan (AVAPRO) 300 MG tablet TAKE 1 TABLET BY MOUTH DAILY. PLEASE CONTACT THE OFFICE TO SCHEDULE APPOINTMENT 90 tablet 0   metoprolol tartrate (LOPRESSOR) 25 MG tablet TAKE 1 TABLET BY MOUTH TWICE A DAY 180 tablet 0   nitroGLYCERIN (NITROSTAT) 0.4 MG SL tablet Place 1 tablet (0.4 mg total) under the tongue every 5 (five) minutes x 3 doses as needed for chest pain. 25 tablet 2   ondansetron (ZOFRAN) 8 MG tablet Take 1  tablet (8 mg total) by mouth every 8 (eight) hours as needed for nausea or vomiting. Starting day 3 after chemotherapy (Patient taking differently: Take 8 mg by mouth as needed for nausea or vomiting.) 30 tablet 2   pantoprazole (PROTONIX) 40 MG tablet Take 1 tablet (40 mg total) by mouth daily. 30 tablet 0   prochlorperazine (COMPAZINE) 10 MG tablet Take 1 tablet (10 mg total) by mouth every 6 (six) hours as needed for nausea or vomiting. (Patient taking differently: Take 10 mg by mouth as needed for nausea or vomiting.) 30 tablet 2    traMADol (ULTRAM) 50 MG tablet Take 50 mg by mouth daily as needed for moderate pain.     No current facility-administered medications on file prior to visit.    Allergies:  Allergies  Allergen Reactions   Codeine Nausea Only   Past Medical History:  Past Medical History:  Diagnosis Date   Arthritis    knee, hands   CAD S/P PCI OM1 99%-0%; Plan Staged PCI mRCA 90%. 02/19/2017   1. Severe 2 vessel obstructive CAD    - 99% thrombotic occlusion of first OM. This is a bifurcating vessel.     - 90% mid RCA-segmental 2. Good overall LV dysfunction with lateral HK 3. Moderately elevated LVEDP 4. Successful stenting of the first OM with DES. Distal embolization into the distal lateral OM branch.   Plan: DAPT for one year. Will treat with IV Aggrastat for 18 hours. Start high dose statin, beta blocker. IV Ntg for BP control acutely. Plan for stage PCI of RCA on Monday 71/06 if no complication.   Essential hypertension 02/21/2017   Hyperlipidemia with target LDL less than 70 02/21/2017   Lung cancer ([place])    Presence of drug-eluting stent in L Cx: OM1 (Xience SIERRA DES 3.5 x 15) 02/21/2017   STEMI involving left circumflex coronary artery ([place]) 02/19/2017   Tobacco abuse 02/19/2017   Past Surgical History:  Past Surgical History:  Procedure Laterality Date   BRONCHIAL BIOPSY  01/15/2022   Procedure: BRONCHIAL BIOPSIES;  Surgeon: [name], MD;  Location: [facility] ENDOSCOPY;  Service: Pulmonary;;   BRONCHIAL BRUSHINGS  01/15/2022   Procedure: BRONCHIAL BRUSHINGS;  Surgeon: [name], MD;  Location: [facility] ENDOSCOPY;  Service: Pulmonary;;   BRONCHIAL NEEDLE ASPIRATION BIOPSY  01/15/2022   Procedure: BRONCHIAL NEEDLE ASPIRATION BIOPSIES;  Surgeon: [name], MD;  Location: [facility] ENDOSCOPY;  Service: Pulmonary;;   CARDIAC CATHETERIZATION  02/21/2017   CHOLECYSTECTOMY     CORONARY STENT INTERVENTION N/A 02/19/2017   Procedure: CORONARY STENT INTERVENTION;  Surgeon: [name], Peter M,  MD;  Location: [place] CV LAB;  Service: Cardiovascular;  Laterality: N/A;   CORONARY STENT INTERVENTION N/A 02/21/2017   Procedure: CORONARY STENT INTERVENTION;  Surgeon: Belva Crome, MD;  Location: [place] CV LAB;  Service: Cardiovascular;  Laterality: N/A;   FIDUCIAL MARKER PLACEMENT  01/15/2022   Procedure: FIDUCIAL MARKER PLACEMENT;  Surgeon: [name], MD;  Location: [place];  Service: Pulmonary;;   FINE NEEDLE ASPIRATION  01/15/2022   Procedure: FINE NEEDLE ASPIRATION;  Surgeon: [name], MD;  Location: [place];  Service: Pulmonary;;   HEMOSTASIS CONTROL  01/15/2022   Procedure: HEMOSTASIS CONTROL;  Surgeon: [name], MD;  Location: [facility] ENDOSCOPY;  Service: Pulmonary;;   KNEE ARTHROSCOPY Right 2010   KNEE ARTHROSCOPY WITH SUBCHONDROPLASTY Left 01/09/2020   Procedure: Left knee arthroscopy,partial medial meniscectomy, debridement, subchondroplasty left proximal tibia;  Surgeon: Susa Day,  MD;  Location: [name];  Service: Orthopedics;  Laterality: Left;   LEFT HEART CATH AND CORONARY ANGIOGRAPHY N/A 02/19/2017   Procedure: LEFT HEART CATH AND CORONARY ANGIOGRAPHY;  Surgeon: [name], Peter M, MD;  Location: [place] CV LAB;  Service: Cardiovascular;  Laterality: N/A;   TONSILLECTOMY     TUBAL LIGATION     VIDEO BRONCHOSCOPY WITH ENDOBRONCHIAL ULTRASOUND N/A 01/15/2022   Procedure: VIDEO BRONCHOSCOPY WITH ENDOBRONCHIAL ULTRASOUND;  Surgeon: [name], MD;  Location: [facility] ENDOSCOPY;  Service: Pulmonary;  Laterality: N/A;   VIDEO BRONCHOSCOPY WITH RADIAL ENDOBRONCHIAL ULTRASOUND  01/15/2022   Procedure: RADIAL ENDOBRONCHIAL ULTRASOUND;  Surgeon: [name], MD;  Location: [facility] ENDOSCOPY;  Service: Pulmonary;;   Social History:  Social History   Socioeconomic History   Marital status: Married    Spouse name: Not on file   Number of children: 3   Years of education: Not on file   Highest education level: Not on file   Occupational History   Not on file  Tobacco Use   Smoking status: Some Days    Packs/day: 1.50    Years: 50.00    Total pack years: 75.00    Types: Cigarettes   Smokeless tobacco: Never   Tobacco comments:    Trying to quit, currently smokes 5-6 cigarettes per day 01/21/2022  Vaping Use   Vaping Use: Never used  Substance and Sexual Activity   Alcohol use: Yes    Comment: occasional   Drug use: Never   Sexual activity: Not Currently    Birth control/protection: Surgical    Comment: Hysterectomy//Tubal Ligation  Other Topics Concern   Not on file  Social History Narrative   Family runs the [employer] [employer].   Social Determinants of Health   Financial Resource Strain: Not on file  Food Insecurity: No Food Insecurity (03/22/2022)   Hunger Vital Sign    Worried About Running Out of Food in the Last Year: Never true    Ran Out of Food in the Last Year: Never true  Transportation Needs: No Transportation Needs (03/22/2022)   PRAPARE - Hydrologist (Medical): No    Lack of Transportation (Non-Medical): No  Physical Activity: Not on file  Stress: Not on file  Social Connections: Not on file  Intimate Partner Violence: Not At Risk (03/22/2022)   Humiliation, Afraid, Rape, and Kick questionnaire    Fear of Current or Ex-Partner: No    Emotionally Abused: No    Physically Abused: No    Sexually Abused: No   Family History:  Family History  Problem Relation Age of Onset   Diabetes Mother    Cancer Father        lung cancer   Heart attack Sister     Review of Systems: Constitutional: Doesn't report fevers, chills or abnormal weight loss Eyes: Doesn't report blurriness of vision Ears, nose, mouth, throat, and face: Doesn't report sore throat Respiratory: Doesn't report cough, dyspnea or wheezes Cardiovascular: Doesn't report palpitation, chest discomfort  Gastrointestinal:  Doesn't report nausea, constipation, diarrhea GU: Doesn't report  incontinence Skin: Doesn't report skin rashes Neurological: Per HPI Musculoskeletal: Doesn't report joint pain Behavioral/Psych: Doesn't report anxiety  Physical Exam: Vitals:   04/06/22 1422  BP: (!) 140/75  Pulse: 70  Resp: 16  Temp: 98.3 F (36.8 C)  SpO2: 97%   KPS: 90. General: Alert, cooperative, pleasant, in no acute distress Head: Normal EENT: No conjunctival injection or scleral icterus.  Lungs: Resp  effort normal Cardiac: Regular rate Abdomen: Non-distended abdomen Skin: No rashes cyanosis or petechiae. Extremities: No clubbing or edema  Neurologic Exam: Mental Status: Awake, alert, attentive to examiner. Oriented to self and environment. Language is fluent with intact comprehension.  Cranial Nerves: Visual acuity is grossly normal. Visual fields are full. Extra-ocular movements intact. No ptosis. Face is symmetric Motor: Tone and bulk are normal. Power is full in both arms and legs. Reflexes are symmetric, no pathologic reflexes present.  Sensory: Intact to light touch Gait: Normal.   Labs: I have reviewed the data as listed    Component Value Date/Time   NA 137 03/23/2022 0614   NA 143 12/08/2020 1620   K 3.8 03/23/2022 0614   CL 106 03/23/2022 0614   CO2 22 03/23/2022 0614   GLUCOSE 147 (H) 03/23/2022 0614   BUN 10 03/23/2022 0614   BUN 11 12/08/2020 1620   CREATININE 0.66 03/23/2022 0614   CREATININE 0.68 03/22/2022 1325   CALCIUM 8.8 (L) 03/23/2022 0614   PROT 7.3 03/23/2022 0614   PROT 7.2 12/08/2020 1620   ALBUMIN 3.6 03/23/2022 0614   ALBUMIN 4.4 12/08/2020 1620   AST 24 03/23/2022 0614   AST 21 03/22/2022 1325   ALT 25 03/23/2022 0614   ALT 22 03/22/2022 1325   ALKPHOS 78 03/23/2022 0614   BILITOT 0.6 03/23/2022 0614   BILITOT 0.4 03/22/2022 1325   GFRNONAA >60 03/23/2022 0614   GFRNONAA >60 03/22/2022 1325   GFRAA 102 03/13/2020 1204   Lab Results  Component Value Date   WBC 6.8 03/23/2022   NEUTROABS 5.0 03/22/2022   HGB 13.3  03/23/2022   HCT 40.0 03/23/2022   MCV 90.1 03/23/2022   PLT 284 03/23/2022    Imaging:  MR BRAIN W WO CONTRAST  Result Date: 03/22/2022 CLINICAL DATA:  Lung cancer, metastasis to brain, stroke suspected EXAM: MRI HEAD WITHOUT AND WITH CONTRAST TECHNIQUE: Multiplanar, multiecho pulse sequences of the brain and surrounding structures were obtained without and with intravenous contrast. CONTRAST:  8.14mL GADAVIST GADOBUTROL 1 MMOL/ML IV SOLN COMPARISON:  02/05/2022 FINDINGS: Brain: No restricted diffusion to suggest acute or subacute infarct. Again noted are multiple enhancing lesions, as follows: Left frontal lobe, 11 mm, previously 9 mm, 16:111 Left parietal lobe, 10 mm, previously 9 mm, 16:108 Right occipital lobe, 16 mm, previously 13 mm, 16:83 Left posterior frontal lobe, 3 mm, previously 4 mm, 16:139 Left frontal lobe, 5 mm, previously 5 mm, 16:124 Right posterior frontal lobe,  8 mm, previously 8 mm,16:121 Medial right parietal lobe, 5 mm, previously 5 mm, 16:112 Lateral right occipital lobe, 5 mm, previously 5 mm, 16:63 Medial right occipital lobe, 4 mm, previously 4 mm, 16:62 Left cerebellum, 13 mm, previously 13 mm, 16:41 Right cerebellum, 4 mm, previously 4 mm, 16:41 Left cerebellum, 4 mm, previously 4 mm, 16:32 Increased surrounding edema is associated with the larger lesions. Several punctate lesions noted on the prior exam are not definitively seen on the current study. No significant hemorrhage. No definite hemosiderin deposition associated with the dominant right occipital lesion. No significant mass effect or midline shift. No hydrocephalus or extra-axial collection. Vascular: Normal arterial flow voids. Normal arterial and venous enhancement. Skull and upper cervical spine: Normal marrow signal. Sinuses/Orbits: Clear paranasal sinuses. The orbits are unremarkable. Other: The mastoids are well aerated. IMPRESSION: 1. Slight interval increase in size of several previously noted metastatic  lesions, as described above, with increased surrounding edema. No significant mass effect or midline shift. 2. Several punctate  lesions noted on the prior exam are not definitively seen on the current study. 3. No evidence of acute or subacute infarct. Electronically Signed   By: Merilyn Baba M.D.   On: 03/22/2022 19:40   CT ANGIO HEAD CODE STROKE  Result Date: 03/22/2022 CLINICAL DATA:  Neuro deficit, acute, stroke suspected EXAM: CT ANGIOGRAPHY HEAD AND NECK TECHNIQUE: Multidetector CT imaging of the head and neck was performed using the standard protocol during bolus administration of intravenous contrast. Multiplanar CT image reconstructions and MIPs were obtained to evaluate the vascular anatomy. Carotid stenosis measurements (when applicable) are obtained utilizing NASCET criteria, using the distal internal carotid diameter as the denominator. RADIATION DOSE REDUCTION: This exam was performed according to the departmental dose-optimization program which includes automated exposure control, adjustment of the mA and/or kV according to patient size and/or use of iterative reconstruction technique. CONTRAST:  7mL OMNIPAQUE IOHEXOL 350 MG/ML SOLN COMPARISON:  CT head from the same day. FINDINGS: CTA NECK FINDINGS Aortic arch: Great vessel origins are patent. Right carotid system: No evidence of dissection, stenosis (50% or greater), or occlusion. Tortuous ICA. Left carotid system: No evidence of dissection, stenosis (50% or greater), or occlusion. Tortuous ICA. Vertebral arteries: Codominant. No evidence of dissection, stenosis (50% or greater), or occlusion. Skeleton: Multilevel degenerative change. Other neck: No acute findings. Upper chest: Partially imaged left upper lobe mass, better characterized on prior CT of the chest 01/08/2022. Review of the MIP images confirms the above findings CTA HEAD FINDINGS Anterior circulation: Bilateral intracranial ICAs, MCAs, and ACAs are patent without proximal  high-grade stenosis. Approximately 2-3 Conical outpouching arising from the posterior/inferior aspect of the left supraclinoid ICA is favored to represent an infundibulum given small vessel near the tip, although a aneurysm is not excluded. Posterior circulation: Bilateral intradural vertebral arteries elbow basilar artery, and bilateral posterior cerebral arteries are patent without proximal high-grade stenosis. Venous sinuses: As permitted by contrast timing, patent. Review of the MIP images confirms the above findings Brain: No evidence of acute infarction, hemorrhage, hydrocephalus, extra-axial collection or mass lesion/mass effect. IMPRESSION: 1. No emergent large vessel occlusion or proximal high-grade stenosis. 2. Approximately 2-3 Conical outpouching arising from the posterior/inferior aspect of the left supraclinoid ICA is favored to represent an infundibulum given small vessel near the tip, although a aneurysm is not excluded. A follow-up CTA in 6-12 months could ensure stability if clinically warranted. Partially imaged left upper lobe mass, better characterized on prior CT of the chest 01/08/2022. Electronically Signed   By: Margaretha Sheffield M.D.   On: 03/22/2022 17:48   CT ANGIO NECK CODE STROKE  Result Date: 03/22/2022 CLINICAL DATA:  Neuro deficit, acute, stroke suspected EXAM: CT ANGIOGRAPHY HEAD AND NECK TECHNIQUE: Multidetector CT imaging of the head and neck was performed using the standard protocol during bolus administration of intravenous contrast. Multiplanar CT image reconstructions and MIPs were obtained to evaluate the vascular anatomy. Carotid stenosis measurements (when applicable) are obtained utilizing NASCET criteria, using the distal internal carotid diameter as the denominator. RADIATION DOSE REDUCTION: This exam was performed according to the departmental dose-optimization program which includes automated exposure control, adjustment of the mA and/or kV according to patient  size and/or use of iterative reconstruction technique. CONTRAST:  20mL OMNIPAQUE IOHEXOL 350 MG/ML SOLN COMPARISON:  CT head from the same day. FINDINGS: CTA NECK FINDINGS Aortic arch: Great vessel origins are patent. Right carotid system: No evidence of dissection, stenosis (50% or greater), or occlusion. Tortuous ICA. Left carotid system: No evidence of  dissection, stenosis (50% or greater), or occlusion. Tortuous ICA. Vertebral arteries: Codominant. No evidence of dissection, stenosis (50% or greater), or occlusion. Skeleton: Multilevel degenerative change. Other neck: No acute findings. Upper chest: Partially imaged left upper lobe mass, better characterized on prior CT of the chest 01/08/2022. Review of the MIP images confirms the above findings CTA HEAD FINDINGS Anterior circulation: Bilateral intracranial ICAs, MCAs, and ACAs are patent without proximal high-grade stenosis. Approximately 2-3 Conical outpouching arising from the posterior/inferior aspect of the left supraclinoid ICA is favored to represent an infundibulum given small vessel near the tip, although a aneurysm is not excluded. Posterior circulation: Bilateral intradural vertebral arteries elbow basilar artery, and bilateral posterior cerebral arteries are patent without proximal high-grade stenosis. Venous sinuses: As permitted by contrast timing, patent. Review of the MIP images confirms the above findings Brain: No evidence of acute infarction, hemorrhage, hydrocephalus, extra-axial collection or mass lesion/mass effect. IMPRESSION: 1. No emergent large vessel occlusion or proximal high-grade stenosis. 2. Approximately 2-3 Conical outpouching arising from the posterior/inferior aspect of the left supraclinoid ICA is favored to represent an infundibulum given small vessel near the tip, although a aneurysm is not excluded. A follow-up CTA in 6-12 months could ensure stability if clinically warranted. Partially imaged left upper lobe mass, better  characterized on prior CT of the chest 01/08/2022. Electronically Signed   By: Margaretha Sheffield M.D.   On: 03/22/2022 17:48   CT HEAD CODE STROKE WO CONTRAST  Result Date: 03/22/2022 CLINICAL DATA:  Code stroke. EXAM: CT HEAD WITHOUT CONTRAST TECHNIQUE: Contiguous axial images were obtained from the base of the skull through the vertex without intravenous contrast. RADIATION DOSE REDUCTION: This exam was performed according to the departmental dose-optimization program which includes automated exposure control, adjustment of the mA and/or kV according to patient size and/or use of iterative reconstruction technique. COMPARISON:  MRI February 05, 2022. FINDINGS: Brain: Multiple lesions throughout bilateral cerebral hemispheres, compatible with metastases better characterized on prior MRI. Small focus of hemorrhage in the region of a probable lesion in the right parieto-occipital region may represent interval hemorrhage of the metastasis. No definite evidence of acute large vascular territory infarct; however, the above metastases limited assessment. No midline shift. No hydrocephalus. Vascular: No hyperdense vessel identified. Skull: No acute fracture. Sinuses/Orbits: Clear sinuses.  No acute orbital findings. IMPRESSION: 1. Multiple lesions throughout bilateral cerebral hemispheres, compatible with metastases better characterized on prior MRI. Small focus of hemorrhage in the region of a probable right parieto-occipital metastatic lesion, possibly interval hemorrhage of the metastasis. MRI with contrast could better assess if clinically warranted. 2. No definite evidence of acute large vascular territory infarct; however, the above metastases limited assessment. MRI could further evaluate. Findings discussed with Dr. Maryan Rued via telephone at 5:19 p.m. Electronically Signed   By: Margaretha Sheffield M.D.   On: 03/22/2022 17:21     Assessment/Plan Metastatic cancer to brain [facility])  Focal seizure  ([place])  Kimberly Huang presents with clinical and radiographic syndrome c/w focal seizure, localizing to the left pre-central gyrus.  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    We recommended initiating therapy with Keppra 500mg  BID for seizure prevention.  We counseled her and her daughter on epilepsy safety today, including driving restrictions.    Burden of edema is minimal; decadron should be decreased to 4mg  daily x5 days, then 2mg  daily x5 days, then stopped if tolerated.  We spent twenty additional minutes teaching regarding the natural history, biology, and historical experience in the  treatment of neurologic complications of cancer.   We appreciate the opportunity to participate in the care of Kimberly Huang.   Kimberly Huang will follow up with [place] in late January following next MRI brain study.  All questions were answered. The patient knows to call the clinic with any problems, questions or concerns. No barriers to learning were detected.  The total time spent in the encounter was 40 minutes and more than 50% was on counseling and review of test results   Ventura Sellers, MD Medical Director of Neuro-Oncology [facility] [place] 04/06/22 2:23 PM

## 2022-04-09 MED FILL — Fosaprepitant Dimeglumine For IV Infusion 150 MG (Base Eq): INTRAVENOUS | Qty: 5 | Status: AC

## 2022-04-09 MED FILL — Dexamethasone Sodium Phosphate Inj 100 MG/10ML: INTRAMUSCULAR | Qty: 1 | Status: AC

## 2022-04-12 ENCOUNTER — Inpatient Hospital Stay: Payer: Medicare Other | Admitting: Internal Medicine

## 2022-04-12 ENCOUNTER — Inpatient Hospital Stay: Payer: Medicare Other

## 2022-04-12 VITALS — BP 132/67 | HR 72 | Temp 98.3°F | Resp 17 | Wt 190.2 lb

## 2022-04-12 DIAGNOSIS — C3490 Malignant neoplasm of unspecified part of unspecified bronchus or lung: Secondary | ICD-10-CM

## 2022-04-12 DIAGNOSIS — C3492 Malignant neoplasm of unspecified part of left bronchus or lung: Secondary | ICD-10-CM

## 2022-04-12 DIAGNOSIS — Z5111 Encounter for antineoplastic chemotherapy: Secondary | ICD-10-CM | POA: Diagnosis not present

## 2022-04-12 LAB — CMP (CANCER CENTER ONLY)
ALT: 92 U/L — ABNORMAL HIGH (ref 0–44)
AST: 34 U/L (ref 15–41)
Albumin: 3.6 g/dL (ref 3.5–5.0)
Alkaline Phosphatase: 71 U/L (ref 38–126)
Anion gap: 7 (ref 5–15)
BUN: 19 mg/dL (ref 8–23)
CO2: 25 mmol/L (ref 22–32)
Calcium: 8.8 mg/dL — ABNORMAL LOW (ref 8.9–10.3)
Chloride: 103 mmol/L (ref 98–111)
Creatinine: 0.7 mg/dL (ref 0.44–1.00)
GFR, Estimated: >60 mL/min (ref >60)
Glucose, Bld: 192 mg/dL — ABNORMAL HIGH (ref 70–99)
Potassium: 3.8 mmol/L (ref 3.5–5.1)
Sodium: 135 mmol/L (ref 135–145)
Total Bilirubin: 0.4 mg/dL (ref 0.3–1.2)
Total Protein: 6 g/dL — ABNORMAL LOW (ref 6.5–8.1)

## 2022-04-12 LAB — CBC WITH DIFFERENTIAL (CANCER CENTER ONLY)
Abs Immature Granulocytes: 0.11 10*3/uL — ABNORMAL HIGH (ref 0.00–0.07)
Basophils Absolute: 0 10*3/uL (ref 0.0–0.1)
Basophils Relative: 0 %
Eosinophils Absolute: 0 10*3/uL (ref 0.0–0.5)
Eosinophils Relative: 0 %
HCT: 35.2 % — ABNORMAL LOW (ref 36.0–46.0)
Hemoglobin: 12.3 g/dL (ref 12.0–15.0)
Immature Granulocytes: 1 %
Lymphocytes Relative: 13 %
Lymphs Abs: 1.1 10*3/uL (ref 0.7–4.0)
MCH: 31.7 pg (ref 26.0–34.0)
MCHC: 34.9 g/dL (ref 30.0–36.0)
MCV: 90.7 fL (ref 80.0–100.0)
Monocytes Absolute: 0.4 10*3/uL (ref 0.1–1.0)
Monocytes Relative: 5 %
Neutro Abs: 6.8 10*3/uL (ref 1.7–7.7)
Neutrophils Relative %: 81 %
Platelet Count: 228 10*3/uL (ref 150–400)
RBC: 3.88 MIL/uL (ref 3.87–5.11)
RDW: 15.7 % — ABNORMAL HIGH (ref 11.5–15.5)
WBC Count: 8.5 10*3/uL (ref 4.0–10.5)
nRBC: 0 % (ref 0.0–0.2)

## 2022-04-12 LAB — TSH: TSH: 0.99 u[IU]/mL (ref 0.350–4.500)

## 2022-04-12 MED ORDER — SODIUM CHLORIDE 0.9 % IV SOLN
500.0000 mg/m2 | Freq: Once | INTRAVENOUS | Status: AC
  Administered 2022-04-12: 1000 mg via INTRAVENOUS
  Filled 2022-04-12: qty 40

## 2022-04-12 MED ORDER — SODIUM CHLORIDE 0.9 % IV SOLN
200.0000 mg | Freq: Once | INTRAVENOUS | Status: AC
  Administered 2022-04-12: 200 mg via INTRAVENOUS
  Filled 2022-04-12: qty 200

## 2022-04-12 MED ORDER — SODIUM CHLORIDE 0.9 % IV SOLN: Freq: Once | INTRAVENOUS | Status: AC

## 2022-04-12 MED ORDER — CYANOCOBALAMIN 1000 MCG/ML IJ SOLN
1000.0000 ug | Freq: Once | INTRAMUSCULAR | Status: AC
  Administered 2022-04-12: 1000 ug via INTRAMUSCULAR
  Filled 2022-04-12: qty 1

## 2022-04-12 MED ORDER — SODIUM CHLORIDE 0.9 % IV SOLN
468.5000 mg | Freq: Once | INTRAVENOUS | Status: AC
  Administered 2022-04-12: 470 mg via INTRAVENOUS
  Filled 2022-04-12: qty 47

## 2022-04-12 MED ORDER — SODIUM CHLORIDE 0.9 % IV SOLN
150.0000 mg | Freq: Once | INTRAVENOUS | Status: AC
  Administered 2022-04-12: 150 mg via INTRAVENOUS
  Filled 2022-04-12: qty 150

## 2022-04-12 MED ORDER — PALONOSETRON HCL INJECTION 0.25 MG/5ML
0.2500 mg | Freq: Once | INTRAVENOUS | Status: AC
  Administered 2022-04-12: 0.25 mg via INTRAVENOUS
  Filled 2022-04-12: qty 5

## 2022-04-12 MED ORDER — SODIUM CHLORIDE 0.9 % IV SOLN
10.0000 mg | Freq: Once | INTRAVENOUS | Status: AC
  Administered 2022-04-12: 10 mg via INTRAVENOUS
  Filled 2022-04-12: qty 10

## 2022-04-12 NOTE — Patient Instructions (Signed)
[facility]  Discharge Instructions: Thank you for choosing [facility] to provide your oncology and hematology care.   If you have a lab appointment with the [facility], please go directly to the [facility] and check in at the registration area.   Wear comfortable clothing and clothing appropriate for easy access to any Portacath or PICC line.   We strive to give you quality time with your provider. You may need to reschedule your appointment if you arrive late (15 or more minutes).  Arriving late affects you and other patients whose appointments are after yours.  Also, if you miss three or more appointments without notifying the office, you may be dismissed from the clinic at the provider's discretion.      For prescription refill requests, have your pharmacy contact our office and allow 72 hours for refills to be completed.    Today you received the following chemotherapy and/or immunotherapy agents: Keytruda, Alimta, Carboplatin   To help prevent nausea and vomiting after your treatment, we encourage you to take your nausea medication as directed.  BELOW ARE SYMPTOMS THAT SHOULD BE REPORTED IMMEDIATELY: *FEVER GREATER THAN 100.4 F (38 C) OR HIGHER *CHILLS OR SWEATING *NAUSEA AND VOMITING THAT IS NOT CONTROLLED WITH YOUR NAUSEA MEDICATION *UNUSUAL SHORTNESS OF BREATH *UNUSUAL BRUISING OR BLEEDING *URINARY PROBLEMS (pain or burning when urinating, or frequent urination) *BOWEL PROBLEMS (unusual diarrhea, constipation, pain near the anus) TENDERNESS IN MOUTH AND THROAT WITH OR WITHOUT PRESENCE OF ULCERS (sore throat, sores in mouth, or a toothache) UNUSUAL RASH, SWELLING OR PAIN  UNUSUAL VAGINAL DISCHARGE OR ITCHING   Items with * indicate a potential emergency and should be followed up as soon as possible or go to the Emergency Department if any problems should occur.  Please show the CHEMOTHERAPY ALERT CARD or IMMUNOTHERAPY ALERT  CARD at check-in to the Emergency Department and triage nurse.  Should you have questions after your visit or need to cancel or reschedule your appointment, please contact [facility]  Dept: [phone]  and follow the prompts.  Office hours are 8:00 a.m. to 4:30 p.m. Monday - Friday. Please note that voicemails left after 4:00 p.m. may not be returned until the following business day.  We are closed weekends and major holidays. You have access to a nurse at all times for urgent questions. Please call the main number to the clinic Dept: [phone] and follow the prompts.   For any non-urgent questions, you may also contact your provider using MyChart. We now offer e-Visits for anyone 61 and older to request care online for non-urgent symptoms. For details visit [email or web address].   Also download the MyChart app! Go to the app store, search "MyChart", open the app, select [facility], and log in with your MyChart username and password.  Masks are optional in the cancer centers. If you would like for your care team to wear a mask while they are taking care of you, please let them know. You may have one support person who is at least 73 years old accompany you for your appointments.

## 2022-04-12 NOTE — Progress Notes (Addendum)
Patient on steroid taper. Now taking Dex 2mg  per day x 5 days starting today. Ok to proceed with treatment per MD.  Acquanetta Belling, RPH,  BCPS, BCOP 04/12/2022 12:46 PM

## 2022-04-12 NOTE — Progress Notes (Signed)
[place] [phone]   [phone]) [phone]  OFFICE PROGRESS NOTE  Patient, No Pcp Per No address on file  DIAGNOSIS: Stage IV (T1c, N2, M1 C) non-small cell lung cancer, adenocarcinoma with positive KRAS G12C mutation diagnosed in September 2023 and presented with left upper lobe lung mass in addition to left hilar and mediastinal lymphadenopathy as well as innumerable brain metastasis.  PRIOR THERAPY: SRS to multiple brain metastasis under the care of Dr. [name] on February 24, 2022.  CURRENT THERAPY: Palliative systemic chemotherapy with carboplatin for AUC of 5, Alimta 500 Mg/M2 and Keytruda 200 Mg IV every 3 weeks.  Status post 2 cycles.  INTERVAL HISTORY: Kimberly Huang 71 y.o. female returns to the clinic today for follow-up visit accompanied by her daughter-in-law Kimberly Huang.  The patient is feeling fine today with no concerning complaints.  She denied having any current chest pain, shortness of breath except with exertion with no cough or hemoptysis.  She has no nausea, vomiting, diarrhea or constipation.  She has no headache or visual changes.  She was seen recently at the emergency department after she had slurred speech and MRI of the brain showed slight interval increase in size of several previously noted metastatic lesions with surrounding edema.  She started on a tapered dose of prednisone and she was seen by Dr. [name].  The patient is feeling much better now. She denied having any fever or chills.  She is here today for evaluation before starting cycle #3.  MEDICAL HISTORY: Past Medical History:  Diagnosis Date   Arthritis    knee, hands   CAD S/P PCI OM1 99%-0%; Plan Staged PCI mRCA 90%. 02/19/2017   1. Severe 2 vessel obstructive CAD    - 99% thrombotic occlusion of first OM. This is a bifurcating vessel.     - 90% mid RCA-segmental 2. Good overall LV dysfunction with lateral HK 3. Moderately elevated LVEDP 4. Successful stenting of the first  OM with DES. Distal embolization into the distal lateral OM branch.   Plan: DAPT for one year. Will treat with IV Aggrastat for 18 hours. Start high dose statin, beta blocker. IV Ntg for BP control acutely. Plan for stage PCI of RCA on Monday 00/37 if no complication.   Essential hypertension 02/21/2017   Hyperlipidemia with target LDL less than 70 02/21/2017   Lung cancer ([place])    Presence of drug-eluting stent in L Cx: OM1 (Xience SIERRA DES 3.5 x 15) 02/21/2017   STEMI involving left circumflex coronary artery ([place]) 02/19/2017   Tobacco abuse 02/19/2017    ALLERGIES:  is allergic to codeine.  MEDICATIONS:  Current Outpatient Medications  Medication Sig Dispense Refill   acetaminophen (TYLENOL) 500 MG tablet Take 500 mg by mouth as needed for moderate pain.     aspirin 81 MG chewable tablet Chew 1 tablet (81 mg total) by mouth daily.     atorvastatin (LIPITOR) 80 MG tablet TAKE 1 TABLET BY MOUTH DAILY AT 6 PM. (Patient taking differently: Take 80 mg by mouth daily.) 90 tablet 3   Coenzyme Q10 (CO Q 10) 100 MG CAPS Take 100 mg by mouth every evening.     dexamethasone (DECADRON) 4 MG tablet Take 1 tablet (4 mg total) by mouth every 6 (six) hours for 6 days, THEN 1 tablet (4 mg total) 3 (three) times daily for 7 days, THEN 1 tablet (4 mg total) 2 (two) times daily for 7 days, THEN 1 tablet (  4 mg total) daily for 7 days. 66 tablet 0   diclofenac Sodium (VOLTAREN) 1 % GEL Apply 1 Application topically daily as needed (Knee pain).     ezetimibe (ZETIA) 10 MG tablet TAKE 1 TABLET BY MOUTH EVERY DAY 90 tablet 0   folic acid (FOLVITE) 1 MG tablet Take 1 tablet (1 mg total) by mouth daily. 30 tablet 4   irbesartan (AVAPRO) 300 MG tablet TAKE 1 TABLET BY MOUTH DAILY. PLEASE CONTACT THE OFFICE TO SCHEDULE APPOINTMENT 90 tablet 0   levETIRAcetam (KEPPRA) 500 MG tablet Take 1 tablet (500 mg total) by mouth 2 (two) times daily. 60 tablet 2   metoprolol tartrate (LOPRESSOR) 25 MG tablet TAKE 1 TABLET BY  MOUTH TWICE A DAY 180 tablet 0   nitroGLYCERIN (NITROSTAT) 0.4 MG SL tablet Place 1 tablet (0.4 mg total) under the tongue every 5 (five) minutes x 3 doses as needed for chest pain. (Patient not taking: Reported on 04/06/2022) 25 tablet 2   ondansetron (ZOFRAN) 8 MG tablet Take 1 tablet (8 mg total) by mouth every 8 (eight) hours as needed for nausea or vomiting. Starting day 3 after chemotherapy (Patient taking differently: Take 8 mg by mouth as needed for nausea or vomiting.) 30 tablet 2   pantoprazole (PROTONIX) 40 MG tablet Take 1 tablet (40 mg total) by mouth daily. 30 tablet 0   prochlorperazine (COMPAZINE) 10 MG tablet Take 1 tablet (10 mg total) by mouth every 6 (six) hours as needed for nausea or vomiting. (Patient taking differently: Take 10 mg by mouth as needed for nausea or vomiting.) 30 tablet 2   traMADol (ULTRAM) 50 MG tablet Take 50 mg by mouth daily as needed for moderate pain.     No current facility-administered medications for this visit.    SURGICAL HISTORY:  Past Surgical History:  Procedure Laterality Date   BRONCHIAL BIOPSY  01/15/2022   Procedure: BRONCHIAL BIOPSIES;  Surgeon: [name], MD;  Location: [facility] ENDOSCOPY;  Service: Pulmonary;;   BRONCHIAL BRUSHINGS  01/15/2022   Procedure: BRONCHIAL BRUSHINGS;  Surgeon: [name], MD;  Location: [facility] ENDOSCOPY;  Service: Pulmonary;;   BRONCHIAL NEEDLE ASPIRATION BIOPSY  01/15/2022   Procedure: BRONCHIAL NEEDLE ASPIRATION BIOPSIES;  Surgeon: [name], MD;  Location: [facility] ENDOSCOPY;  Service: Pulmonary;;   CARDIAC CATHETERIZATION  02/21/2017   CHOLECYSTECTOMY     CORONARY STENT INTERVENTION N/A 02/19/2017   Procedure: CORONARY STENT INTERVENTION;  Surgeon: [name], Peter M, MD;  Location: [place] CV LAB;  Service: Cardiovascular;  Laterality: N/A;   CORONARY STENT INTERVENTION N/A 02/21/2017   Procedure: CORONARY STENT INTERVENTION;  Surgeon: Belva Crome, MD;  Location: [place] CV LAB;  Service:  Cardiovascular;  Laterality: N/A;   FIDUCIAL MARKER PLACEMENT  01/15/2022   Procedure: FIDUCIAL MARKER PLACEMENT;  Surgeon: [name], MD;  Location: [place];  Service: Pulmonary;;   FINE NEEDLE ASPIRATION  01/15/2022   Procedure: FINE NEEDLE ASPIRATION;  Surgeon: [name], MD;  Location: [place];  Service: Pulmonary;;   HEMOSTASIS CONTROL  01/15/2022   Procedure: HEMOSTASIS CONTROL;  Surgeon: [name], MD;  Location: [facility] ENDOSCOPY;  Service: Pulmonary;;   KNEE ARTHROSCOPY Right 2010   KNEE ARTHROSCOPY WITH SUBCHONDROPLASTY Left 01/09/2020   Procedure: Left knee arthroscopy,partial medial meniscectomy, debridement, subchondroplasty left proximal tibia;  Surgeon: Susa Day, MD;  Location: [name];  Service: Orthopedics;  Laterality: Left;   LEFT HEART CATH AND CORONARY ANGIOGRAPHY N/A 02/19/2017   Procedure:  LEFT HEART CATH AND CORONARY ANGIOGRAPHY;  Surgeon: [name], Peter M, MD;  Location: [place] CV LAB;  Service: Cardiovascular;  Laterality: N/A;   TONSILLECTOMY     TUBAL LIGATION     VIDEO BRONCHOSCOPY WITH ENDOBRONCHIAL ULTRASOUND N/A 01/15/2022   Procedure: VIDEO BRONCHOSCOPY WITH ENDOBRONCHIAL ULTRASOUND;  Surgeon: [name], MD;  Location: [facility] ENDOSCOPY;  Service: Pulmonary;  Laterality: N/A;   VIDEO BRONCHOSCOPY WITH RADIAL ENDOBRONCHIAL ULTRASOUND  01/15/2022   Procedure: RADIAL ENDOBRONCHIAL ULTRASOUND;  Surgeon: [name], MD;  Location: [facility] ENDOSCOPY;  Service: Pulmonary;;    REVIEW OF SYSTEMS:  Constitutional: positive for fatigue Eyes: negative Ears, nose, mouth, throat, and face: negative Respiratory: positive for dyspnea on exertion Cardiovascular: negative Gastrointestinal: negative Genitourinary:negative Integument/breast: negative Hematologic/lymphatic: negative Musculoskeletal:negative Neurological: negative Behavioral/Psych: negative Endocrine: negative Allergic/Immunologic: negative   PHYSICAL  EXAMINATION: General appearance: alert, cooperative, fatigued, and no distress Head: Normocephalic, without obvious abnormality, atraumatic Neck: no adenopathy, no JVD, supple, symmetrical, trachea midline, and thyroid not enlarged, symmetric, no tenderness/mass/nodules Lymph nodes: Cervical, supraclavicular, and axillary nodes normal. Resp: clear to auscultation bilaterally Back: symmetric, no curvature. ROM normal. No CVA tenderness. Cardio: regular rate and rhythm, S1, S2 normal, no murmur, click, rub or gallop GI: soft, non-tender; bowel sounds normal; no masses,  no organomegaly Extremities: extremities normal, atraumatic, no cyanosis or edema Neurologic: Alert and oriented X 3, normal strength and tone. Normal symmetric reflexes. Normal coordination and gait  ECOG PERFORMANCE STATUS: 1 - Symptomatic but completely ambulatory  Blood pressure 132/67, pulse 72, temperature 98.3 F (36.8 C), temperature source Oral, resp. rate 17, weight 190 lb 4 oz (86.3 kg), SpO2 99 %.  LABORATORY DATA: Lab Results  Component Value Date   WBC 8.5 04/12/2022   HGB 12.3 04/12/2022   HCT 35.2 (L) 04/12/2022   MCV 90.7 04/12/2022   PLT 228 04/12/2022      Chemistry      Component Value Date/Time   NA 135 04/12/2022 0932   NA 143 12/08/2020 1620   K 3.8 04/12/2022 0932   CL 103 04/12/2022 0932   CO2 25 04/12/2022 0932   BUN 19 04/12/2022 0932   BUN 11 12/08/2020 1620   CREATININE 0.70 04/12/2022 0932      Component Value Date/Time   CALCIUM 8.8 (L) 04/12/2022 0932   ALKPHOS 71 04/12/2022 0932   AST 34 04/12/2022 0932   ALT 92 (H) 04/12/2022 0932   BILITOT 0.4 04/12/2022 0932       RADIOGRAPHIC STUDIES: MR BRAIN W WO CONTRAST  Result Date: 03/22/2022 CLINICAL DATA:  Lung cancer, metastasis to brain, stroke suspected EXAM: MRI HEAD WITHOUT AND WITH CONTRAST TECHNIQUE: Multiplanar, multiecho pulse sequences of the brain and surrounding structures were obtained without and with  intravenous contrast. CONTRAST:  8.72m GADAVIST GADOBUTROL 1 MMOL/ML IV SOLN COMPARISON:  02/05/2022 FINDINGS: Brain: No restricted diffusion to suggest acute or subacute infarct. Again noted are multiple enhancing lesions, as follows: Left frontal lobe, 11 mm, previously 9 mm, 16:111 Left parietal lobe, 10 mm, previously 9 mm, 16:108 Right occipital lobe, 16 mm, previously 13 mm, 16:83 Left posterior frontal lobe, 3 mm, previously 4 mm, 16:139 Left frontal lobe, 5 mm, previously 5 mm, 16:124 Right posterior frontal lobe,  8 mm, previously 8 mm,16:121 Medial right parietal lobe, 5 mm, previously 5 mm, 16:112 Lateral right occipital lobe, 5 mm, previously 5 mm, 16:63 Medial right occipital lobe, 4 mm, previously 4 mm, 16:62 Left cerebellum, 13 mm, previously 13 mm, 16:41  Right cerebellum, 4 mm, previously 4 mm, 16:41 Left cerebellum, 4 mm, previously 4 mm, 16:32 Increased surrounding edema is associated with the larger lesions. Several punctate lesions noted on the prior exam are not definitively seen on the current study. No significant hemorrhage. No definite hemosiderin deposition associated with the dominant right occipital lesion. No significant mass effect or midline shift. No hydrocephalus or extra-axial collection. Vascular: Normal arterial flow voids. Normal arterial and venous enhancement. Skull and upper cervical spine: Normal marrow signal. Sinuses/Orbits: Clear paranasal sinuses. The orbits are unremarkable. Other: The mastoids are well aerated. IMPRESSION: 1. Slight interval increase in size of several previously noted metastatic lesions, as described above, with increased surrounding edema. No significant mass effect or midline shift. 2. Several punctate lesions noted on the prior exam are not definitively seen on the current study. 3. No evidence of acute or subacute infarct. Electronically Signed   By: Merilyn Baba M.D.   On: 03/22/2022 19:40   CT ANGIO HEAD CODE STROKE  Result Date:  03/22/2022 CLINICAL DATA:  Neuro deficit, acute, stroke suspected EXAM: CT ANGIOGRAPHY HEAD AND NECK TECHNIQUE: Multidetector CT imaging of the head and neck was performed using the standard protocol during bolus administration of intravenous contrast. Multiplanar CT image reconstructions and MIPs were obtained to evaluate the vascular anatomy. Carotid stenosis measurements (when applicable) are obtained utilizing NASCET criteria, using the distal internal carotid diameter as the denominator. RADIATION DOSE REDUCTION: This exam was performed according to the departmental dose-optimization program which includes automated exposure control, adjustment of the mA and/or kV according to patient size and/or use of iterative reconstruction technique. CONTRAST:  44m OMNIPAQUE IOHEXOL 350 MG/ML SOLN COMPARISON:  CT head from the same day. FINDINGS: CTA NECK FINDINGS Aortic arch: Great vessel origins are patent. Right carotid system: No evidence of dissection, stenosis (50% or greater), or occlusion. Tortuous ICA. Left carotid system: No evidence of dissection, stenosis (50% or greater), or occlusion. Tortuous ICA. Vertebral arteries: Codominant. No evidence of dissection, stenosis (50% or greater), or occlusion. Skeleton: Multilevel degenerative change. Other neck: No acute findings. Upper chest: Partially imaged left upper lobe mass, better characterized on prior CT of the chest 01/08/2022. Review of the MIP images confirms the above findings CTA HEAD FINDINGS Anterior circulation: Bilateral intracranial ICAs, MCAs, and ACAs are patent without proximal high-grade stenosis. Approximately 2-3 Conical outpouching arising from the posterior/inferior aspect of the left supraclinoid ICA is favored to represent an infundibulum given small vessel near the tip, although a aneurysm is not excluded. Posterior circulation: Bilateral intradural vertebral arteries elbow basilar artery, and bilateral posterior cerebral arteries are  patent without proximal high-grade stenosis. Venous sinuses: As permitted by contrast timing, patent. Review of the MIP images confirms the above findings Brain: No evidence of acute infarction, hemorrhage, hydrocephalus, extra-axial collection or mass lesion/mass effect. IMPRESSION: 1. No emergent large vessel occlusion or proximal high-grade stenosis. 2. Approximately 2-3 Conical outpouching arising from the posterior/inferior aspect of the left supraclinoid ICA is favored to represent an infundibulum given small vessel near the tip, although a aneurysm is not excluded. A follow-up CTA in 6-12 months could ensure stability if clinically warranted. Partially imaged left upper lobe mass, better characterized on prior CT of the chest 01/08/2022. Electronically Signed   By: FMargaretha SheffieldM.D.   On: 03/22/2022 17:48   CT ANGIO NECK CODE STROKE  Result Date: 03/22/2022 CLINICAL DATA:  Neuro deficit, acute, stroke suspected EXAM: CT ANGIOGRAPHY HEAD AND NECK TECHNIQUE: Multidetector CT imaging of the  head and neck was performed using the standard protocol during bolus administration of intravenous contrast. Multiplanar CT image reconstructions and MIPs were obtained to evaluate the vascular anatomy. Carotid stenosis measurements (when applicable) are obtained utilizing NASCET criteria, using the distal internal carotid diameter as the denominator. RADIATION DOSE REDUCTION: This exam was performed according to the departmental dose-optimization program which includes automated exposure control, adjustment of the mA and/or kV according to patient size and/or use of iterative reconstruction technique. CONTRAST:  55m OMNIPAQUE IOHEXOL 350 MG/ML SOLN COMPARISON:  CT head from the same day. FINDINGS: CTA NECK FINDINGS Aortic arch: Great vessel origins are patent. Right carotid system: No evidence of dissection, stenosis (50% or greater), or occlusion. Tortuous ICA. Left carotid system: No evidence of dissection,  stenosis (50% or greater), or occlusion. Tortuous ICA. Vertebral arteries: Codominant. No evidence of dissection, stenosis (50% or greater), or occlusion. Skeleton: Multilevel degenerative change. Other neck: No acute findings. Upper chest: Partially imaged left upper lobe mass, better characterized on prior CT of the chest 01/08/2022. Review of the MIP images confirms the above findings CTA HEAD FINDINGS Anterior circulation: Bilateral intracranial ICAs, MCAs, and ACAs are patent without proximal high-grade stenosis. Approximately 2-3 Conical outpouching arising from the posterior/inferior aspect of the left supraclinoid ICA is favored to represent an infundibulum given small vessel near the tip, although a aneurysm is not excluded. Posterior circulation: Bilateral intradural vertebral arteries elbow basilar artery, and bilateral posterior cerebral arteries are patent without proximal high-grade stenosis. Venous sinuses: As permitted by contrast timing, patent. Review of the MIP images confirms the above findings Brain: No evidence of acute infarction, hemorrhage, hydrocephalus, extra-axial collection or mass lesion/mass effect. IMPRESSION: 1. No emergent large vessel occlusion or proximal high-grade stenosis. 2. Approximately 2-3 Conical outpouching arising from the posterior/inferior aspect of the left supraclinoid ICA is favored to represent an infundibulum given small vessel near the tip, although a aneurysm is not excluded. A follow-up CTA in 6-12 months could ensure stability if clinically warranted. Partially imaged left upper lobe mass, better characterized on prior CT of the chest 01/08/2022. Electronically Signed   By: FMargaretha SheffieldM.D.   On: 03/22/2022 17:48   CT HEAD CODE STROKE WO CONTRAST  Result Date: 03/22/2022 CLINICAL DATA:  Code stroke. EXAM: CT HEAD WITHOUT CONTRAST TECHNIQUE: Contiguous axial images were obtained from the base of the skull through the vertex without intravenous  contrast. RADIATION DOSE REDUCTION: This exam was performed according to the departmental dose-optimization program which includes automated exposure control, adjustment of the mA and/or kV according to patient size and/or use of iterative reconstruction technique. COMPARISON:  MRI February 05, 2022. FINDINGS: Brain: Multiple lesions throughout bilateral cerebral hemispheres, compatible with metastases better characterized on prior MRI. Small focus of hemorrhage in the region of a probable lesion in the right parieto-occipital region may represent interval hemorrhage of the metastasis. No definite evidence of acute large vascular territory infarct; however, the above metastases limited assessment. No midline shift. No hydrocephalus. Vascular: No hyperdense vessel identified. Skull: No acute fracture. Sinuses/Orbits: Clear sinuses.  No acute orbital findings. IMPRESSION: 1. Multiple lesions throughout bilateral cerebral hemispheres, compatible with metastases better characterized on prior MRI. Small focus of hemorrhage in the region of a probable right parieto-occipital metastatic lesion, possibly interval hemorrhage of the metastasis. MRI with contrast could better assess if clinically warranted. 2. No definite evidence of acute large vascular territory infarct; however, the above metastases limited assessment. MRI could further evaluate. Findings discussed with Dr. PMaryan Ruedvia  telephone at 5:19 p.m. Electronically Signed   By: Margaretha Sheffield M.D.   On: 03/22/2022 17:21    ASSESSMENT AND PLAN: This is a very pleasant 71 years old white female with Stage IV (T1c, N2, M1 C) non-small cell lung cancer, adenocarcinoma with positive KRAS G12C mutation diagnosed in September 2023 and presented with left upper lobe lung mass in addition to left hilar and mediastinal lymphadenopathy as well as innumerable brain metastasis. The patient is scheduled for SRS to multiple brain metastasis on February 24, 2022. The patient  is currently undergoing systemic chemotherapy with carboplatin for AUC of 5, Alimta 500 Mg/M2 and Keytruda 200 Mg IV every 3 weeks status post 2 cycles. The patient has been tolerating this treatment well with no concerning adverse effects. I recommended for her to proceed with cycle #3 today as planned. I will see her back for follow-up visit in 3 weeks for evaluation before starting cycle #4 with repeat CT scan of the chest, abdomen and pelvis for restaging of her disease. For the history of multiple brain metastasis and vasogenic edema, she is currently on a tapered dose of Decadron 2 mg p.o. daily for now.  She will complete this taper this week. The patient was advised to call immediately if she has any other concerning symptoms in the interval. The patient voices understanding of current disease status and treatment options and is in agreement with the current care plan.  All questions were answered. The patient knows to call the clinic with any problems, questions or concerns. We can certainly see the patient much sooner if necessary.  The total time spent in the appointment was 30 minutes.  Disclaimer: This note was dictated with voice recognition software. Similar sounding words can inadvertently be transcribed and may not be corrected upon review.

## 2022-04-12 NOTE — Progress Notes (Signed)
Per Dr. [name] it is ok to treat pt today with Carboplatin, Alimta and Keytruda with ALT of 92.

## 2022-04-13 ENCOUNTER — Ambulatory Visit: Payer: Medicare Other | Admitting: Internal Medicine

## 2022-04-27 ENCOUNTER — Other Ambulatory Visit (HOSPITAL_COMMUNITY): Payer: Self-pay

## 2022-04-29 ENCOUNTER — Encounter: Payer: Self-pay | Admitting: Physician Assistant

## 2022-04-29 NOTE — Progress Notes (Signed)
[place] OFFICE PROGRESS NOTE  Patient, No Pcp Per No address on file  DIAGNOSIS: Stage IV (T1c, N2, M1 C) non-small cell lung cancer, adenocarcinoma with positive KRAS G12C mutation diagnosed in September 2023 and presented with left upper lobe lung mass in addition to left hilar and mediastinal lymphadenopathy as well as innumerable brain metastasis.    PDL1 Expression: 93%   PRIOR THERAPY: SRS to multiple brain metastasis under the care of Dr. [name] on February 24, 2022.    CURRENT THERAPY:  Palliative systemic chemotherapy with carboplatin for AUC of 5, Alimta 500 Mg/M2 and Keytruda 200 Mg IV every 3 weeks. Status post 3 cycles.    INTERVAL HISTORY: Kimberly Huang 71 y.o. female returns to the clinic today for a follow-up visit accompanied by her daughter-in-law. The patient was last seen by Dr. [name] 3 weeks ago.  She was present for restaging CT scan prior to her appointment today but has not been scheduled at this time until 05/12/22.  Since last being seen she denies any changes in her health.  She tolerates her treatment well except for intermittent nausea and intermittent vomiting sometimes first thing in the morning.  She lost approximately 2 pounds since last being seen and she is scheduled to see member the nutritionist team while in the infusion room today.  Sometimes she may have a metallic taste in her mouth.  She does not have any evidence of thrush.  She denies any fever, chills, or night sweats. She denies any dyspnea on exertion, chest pain, cough, or hemoptysis.  She did have an episode of diarrhea around Christmas the last 2 to 3 days approximately 3 times a day.  She has been taking the stool softener around this time and stopped taking it till her bowels returned to normal.  He denies any associated belly pain or blood in the stool. She follows closely with neuro-oncology for her metastatic brain lesions for which she completed prednisone.  She is requesting  a refill of Protonix as that helps with some of her indigestion.  She denies any rashes or skin changes.  She is here today for evaluation repeat blood work before undergoing cycle #4.   MEDICAL HISTORY: Past Medical History:  Diagnosis Date   Arthritis    knee, hands   CAD S/P PCI OM1 99%-0%; Plan Staged PCI mRCA 90%. 02/19/2017   1. Severe 2 vessel obstructive CAD    - 99% thrombotic occlusion of first OM. This is a bifurcating vessel.     - 90% mid RCA-segmental 2. Good overall LV dysfunction with lateral HK 3. Moderately elevated LVEDP 4. Successful stenting of the first OM with DES. Distal embolization into the distal lateral OM branch.   Plan: DAPT for one year. Will treat with IV Aggrastat for 18 hours. Start high dose statin, beta blocker. IV Ntg for BP control acutely. Plan for stage PCI of RCA on Monday 37/85 if no complication.   Essential hypertension 02/21/2017   Hyperlipidemia with target LDL less than 70 02/21/2017   Lung cancer ([place])    Presence of drug-eluting stent in L Cx: OM1 (Xience SIERRA DES 3.5 x 15) 02/21/2017   STEMI involving left circumflex coronary artery ([place]) 02/19/2017   Tobacco abuse 02/19/2017    ALLERGIES:  is allergic to codeine.  MEDICATIONS:  Current Outpatient Medications  Medication Sig Dispense Refill   acetaminophen (TYLENOL) 500 MG tablet Take 500 mg by mouth as needed for moderate pain.  aspirin 81 MG chewable tablet Chew 1 tablet (81 mg total) by mouth daily.     atorvastatin (LIPITOR) 80 MG tablet TAKE 1 TABLET BY MOUTH DAILY AT 6 PM. (Patient taking differently: Take 80 mg by mouth daily.) 90 tablet 3   Coenzyme Q10 (CO Q 10) 100 MG CAPS Take 100 mg by mouth every evening.     diclofenac Sodium (VOLTAREN) 1 % GEL Apply 1 Application topically daily as needed (Knee pain).     ezetimibe (ZETIA) 10 MG tablet TAKE 1 TABLET BY MOUTH EVERY DAY 90 tablet 0   folic acid (FOLVITE) 1 MG tablet Take 1 tablet (1 mg total) by mouth daily. 30 tablet 4    irbesartan (AVAPRO) 300 MG tablet TAKE 1 TABLET BY MOUTH DAILY. PLEASE CONTACT THE OFFICE TO SCHEDULE APPOINTMENT 90 tablet 0   levETIRAcetam (KEPPRA) 500 MG tablet Take 1 tablet (500 mg total) by mouth 2 (two) times daily. 60 tablet 2   metoprolol tartrate (LOPRESSOR) 25 MG tablet TAKE 1 TABLET BY MOUTH TWICE A DAY 180 tablet 0   nitroGLYCERIN (NITROSTAT) 0.4 MG SL tablet Place 1 tablet (0.4 mg total) under the tongue every 5 (five) minutes x 3 doses as needed for chest pain. 25 tablet 2   ondansetron (ZOFRAN) 8 MG tablet Take 1 tablet (8 mg total) by mouth every 8 (eight) hours as needed for nausea or vomiting. Starting day 3 after chemotherapy (Patient taking differently: Take 8 mg by mouth as needed for nausea or vomiting.) 30 tablet 2   prochlorperazine (COMPAZINE) 10 MG tablet Take 1 tablet (10 mg total) by mouth every 6 (six) hours as needed for nausea or vomiting. (Patient taking differently: Take 10 mg by mouth as needed for nausea or vomiting.) 30 tablet 2   traMADol (ULTRAM) 50 MG tablet Take 50 mg by mouth daily as needed for moderate pain.     pantoprazole (PROTONIX) 40 MG tablet Take 1 tablet (40 mg total) by mouth daily. 30 tablet 0   No current facility-administered medications for this visit.    SURGICAL HISTORY:  Past Surgical History:  Procedure Laterality Date   BRONCHIAL BIOPSY  01/15/2022   Procedure: BRONCHIAL BIOPSIES;  Surgeon: [name], MD;  Location: [facility] ENDOSCOPY;  Service: Pulmonary;;   BRONCHIAL BRUSHINGS  01/15/2022   Procedure: BRONCHIAL BRUSHINGS;  Surgeon: [name], MD;  Location: [facility] ENDOSCOPY;  Service: Pulmonary;;   BRONCHIAL NEEDLE ASPIRATION BIOPSY  01/15/2022   Procedure: BRONCHIAL NEEDLE ASPIRATION BIOPSIES;  Surgeon: [name], MD;  Location: [facility] ENDOSCOPY;  Service: Pulmonary;;   CARDIAC CATHETERIZATION  02/21/2017   CHOLECYSTECTOMY     CORONARY STENT INTERVENTION N/A 02/19/2017   Procedure: CORONARY STENT INTERVENTION;   Surgeon: [name], Peter M, MD;  Location: [place] CV LAB;  Service: Cardiovascular;  Laterality: N/A;   CORONARY STENT INTERVENTION N/A 02/21/2017   Procedure: CORONARY STENT INTERVENTION;  Surgeon: Belva Crome, MD;  Location: [place] CV LAB;  Service: Cardiovascular;  Laterality: N/A;   FIDUCIAL MARKER PLACEMENT  01/15/2022   Procedure: FIDUCIAL MARKER PLACEMENT;  Surgeon: [name], MD;  Location: [facility] ENDOSCOPY;  Service: Pulmonary;;   FINE NEEDLE ASPIRATION  01/15/2022   Procedure: FINE NEEDLE ASPIRATION;  Surgeon: [name], MD;  Location: [facility] ENDOSCOPY;  Service: Pulmonary;;   HEMOSTASIS CONTROL  01/15/2022   Procedure: HEMOSTASIS CONTROL;  Surgeon: [name], MD;  Location: [facility] ENDOSCOPY;  Service: Pulmonary;;   KNEE ARTHROSCOPY Right 2010  KNEE ARTHROSCOPY WITH SUBCHONDROPLASTY Left 01/09/2020   Procedure: Left knee arthroscopy,partial medial meniscectomy, debridement, subchondroplasty left proximal tibia;  Surgeon: Susa Day, MD;  Location: [name];  Service: Orthopedics;  Laterality: Left;   LEFT HEART CATH AND CORONARY ANGIOGRAPHY N/A 02/19/2017   Procedure: LEFT HEART CATH AND CORONARY ANGIOGRAPHY;  Surgeon: [name], Peter M, MD;  Location: [place] CV LAB;  Service: Cardiovascular;  Laterality: N/A;   TONSILLECTOMY     TUBAL LIGATION     VIDEO BRONCHOSCOPY WITH ENDOBRONCHIAL ULTRASOUND N/A 01/15/2022   Procedure: VIDEO BRONCHOSCOPY WITH ENDOBRONCHIAL ULTRASOUND;  Surgeon: [name], MD;  Location: [facility] ENDOSCOPY;  Service: Pulmonary;  Laterality: N/A;   VIDEO BRONCHOSCOPY WITH RADIAL ENDOBRONCHIAL ULTRASOUND  01/15/2022   Procedure: RADIAL ENDOBRONCHIAL ULTRASOUND;  Surgeon: [name], MD;  Location: [facility] ENDOSCOPY;  Service: Pulmonary;;    REVIEW OF SYSTEMS:   Review of Systems  Constitutional: Positive for 2 pound weight loss.  Negative for appetite change, chills, fatigue, fever.   HENT: Negative for mouth sores, nosebleeds,  sore throat and trouble swallowing.   Eyes: Negative for eye problems and icterus.  Respiratory: Negative for cough, hemoptysis, shortness of breath and wheezing.   Cardiovascular: Negative for chest pain and leg swelling.  Gastrointestinal: Positive for intermittent nausea/vomiting.  Negative for abdominal pain, constipation, and diarrhea (none at this time).  Genitourinary: Negative for bladder incontinence, difficulty urinating, dysuria, frequency and hematuria.   Musculoskeletal: Negative for back pain, gait problem, neck pain and neck stiffness.  Skin: Negative for itching and rash.  Neurological: Negative for dizziness, extremity weakness, gait problem, headaches, light-headedness and seizures.  Hematological: Negative for adenopathy. Does not bruise/bleed easily.  Psychiatric/Behavioral: Negative for confusion, depression and sleep disturbance. The patient is not nervous/anxious.     PHYSICAL EXAMINATION:  Blood pressure 136/62, pulse 87, temperature 97.8 F (36.6 C), temperature source Temporal, resp. rate 18, height _0  (1.575 m), weight 188 lb 1.6 oz (85.3 kg), SpO2 98 %.  ECOG PERFORMANCE STATUS: 1  Physical Exam  Constitutional: Oriented to person, place, and time and well-developed, well-nourished, and in no distress.  HENT:  Head: Normocephalic and atraumatic.  Mouth/Throat: Oropharynx is clear and moist. No oropharyngeal exudate.  Eyes: Conjunctivae are normal. Right eye exhibits no discharge. Left eye exhibits no discharge. No scleral icterus.  Neck: Normal range of motion. Neck supple.  Cardiovascular: Normal rate, regular rhythm, normal heart sounds and intact distal pulses.   Pulmonary/Chest: Effort normal and breath sounds normal. No respiratory distress. No wheezes. No rales.  Abdominal: Soft. Bowel sounds are normal. Exhibits no distension and no mass. There is no tenderness.  Musculoskeletal: Normal range of motion. Exhibits no edema.  Lymphadenopathy:    No  cervical adenopathy.  Neurological: Alert and oriented to person, place, and time. Exhibits normal muscle tone. Gait normal. Coordination normal.  Ambulates with a cane. Skin: Skin is warm and dry. No rash noted. Not diaphoretic. No erythema. No pallor.  Psychiatric: Mood, memory and judgment normal.  Vitals reviewed.  LABORATORY DATA: Lab Results  Component Value Date   WBC 6.2 05/04/2022   HGB 11.9 (L) 05/04/2022   HCT 34.0 (L) 05/04/2022   MCV 90.4 05/04/2022   PLT 297 05/04/2022      Chemistry      Component Value Date/Time   NA 139 05/04/2022 0920   NA 143 12/08/2020 1620   K 3.6 05/04/2022 0920   CL 106 05/04/2022 0920   CO2 25 05/04/2022 0920  BUN 8 05/04/2022 0920   BUN 11 12/08/2020 1620   CREATININE 0.80 05/04/2022 0920      Component Value Date/Time   CALCIUM 9.2 05/04/2022 0920   ALKPHOS 67 05/04/2022 0920   AST 28 05/04/2022 0920   ALT 29 05/04/2022 0920   BILITOT 0.5 05/04/2022 0920       RADIOGRAPHIC STUDIES:  No results found.   ASSESSMENT/PLAN:  This is a very pleasant 71 year old Caucasian female diagnosed with stage IV (T1c, N2, M1 C) non-small cell lung cancer, adenocarcinoma.  The patient presented with a left upper lobe lung mass in addition to left hilar mediastinal lymphadenopathy as well as innumerable brain metastases.  She was diagnosed in September 2023.   She is positive for K-ras G12 C mutation which can be targeted in the second line setting. Her PDL1 expression is 93%.   She underwent SRS to the multiple brain lesions on 02/24/2022.   She is currently undergoing palliative systemic chemotherapy with carboplatin for AUC of 5, Alimta 500 mg per metered square, Keytruda 20 mg IV every 3 weeks.  She is status post 3 cycle and tolerated it well except for controlled nausea/vomiting.   Labs were reviewed.  Recommend that she proceed with cycle #4 today scheduled.  Starting from her next cycle of treatment, she will start maintenance  Alimta and Keytruda.  Her restaging CT scan is scheduled for 05/12/2021.  I will call the patient or send a MyChart message with the results and will arrange for follow-up sooner if there is any concerning findings.  We reviewed how to take her antiemetics.   We also reviewed what to do if she develops any diarrhea or constipation in the interval.  She is scheduled to see member the nutritionist team while in the infusion room today.   I sent her prescription for Protonix due to indigestion.  She does not have any evidence of thrush today.  Discussed sometimes reflux can cause metallic taste.  I also discussed sometimes patients can get taste alterations with carboplatin which will be dropped from her next cycle of treatment onwards.  The patient was advised to call immediately if she has any concerning symptoms in the interval. The patient voices understanding of current disease status and treatment options and is in agreement with the current care plan. All questions were answered. The patient knows to call the clinic with any problems, questions or concerns. We can certainly see the patient much sooner if necessary     No orders of the defined types were placed in this encounter.    The total time spent in the appointment was 20-29 minutes   Avion Patella L Brenda Samano, PA-C 05/04/22

## 2022-04-30 MED FILL — Dexamethasone Sodium Phosphate Inj 100 MG/10ML: INTRAMUSCULAR | Qty: 1 | Status: AC

## 2022-04-30 MED FILL — Fosaprepitant Dimeglumine For IV Infusion 150 MG (Base Eq): INTRAVENOUS | Qty: 5 | Status: AC

## 2022-05-04 ENCOUNTER — Inpatient Hospital Stay: Payer: Medicare Other | Admitting: Dietician

## 2022-05-04 ENCOUNTER — Inpatient Hospital Stay: Payer: Medicare Other

## 2022-05-04 ENCOUNTER — Inpatient Hospital Stay: Payer: Medicare Other | Attending: Radiation Oncology

## 2022-05-04 ENCOUNTER — Other Ambulatory Visit: Payer: Self-pay

## 2022-05-04 ENCOUNTER — Inpatient Hospital Stay (HOSPITAL_BASED_OUTPATIENT_CLINIC_OR_DEPARTMENT_OTHER): Payer: Medicare Other | Admitting: Physician Assistant

## 2022-05-04 VITALS — BP 140/78 | HR 73 | Resp 18

## 2022-05-04 VITALS — BP 136/62 | HR 87 | Temp 97.8°F | Resp 18 | Ht 62.0 in | Wt 188.1 lb

## 2022-05-04 DIAGNOSIS — Z79899 Other long term (current) drug therapy: Secondary | ICD-10-CM | POA: Diagnosis not present

## 2022-05-04 DIAGNOSIS — C7931 Secondary malignant neoplasm of brain: Secondary | ICD-10-CM | POA: Diagnosis not present

## 2022-05-04 DIAGNOSIS — Z5111 Encounter for antineoplastic chemotherapy: Secondary | ICD-10-CM | POA: Insufficient documentation

## 2022-05-04 DIAGNOSIS — Z5112 Encounter for antineoplastic immunotherapy: Secondary | ICD-10-CM

## 2022-05-04 DIAGNOSIS — C3492 Malignant neoplasm of unspecified part of left bronchus or lung: Secondary | ICD-10-CM

## 2022-05-04 DIAGNOSIS — C3412 Malignant neoplasm of upper lobe, left bronchus or lung: Secondary | ICD-10-CM | POA: Insufficient documentation

## 2022-05-04 LAB — CMP (CANCER CENTER ONLY)
ALT: 29 U/L (ref 0–44)
AST: 28 U/L (ref 15–41)
Albumin: 3.8 g/dL (ref 3.5–5.0)
Alkaline Phosphatase: 67 U/L (ref 38–126)
Anion gap: 8 (ref 5–15)
BUN: 8 mg/dL (ref 8–23)
CO2: 25 mmol/L (ref 22–32)
Calcium: 9.2 mg/dL (ref 8.9–10.3)
Chloride: 106 mmol/L (ref 98–111)
Creatinine: 0.8 mg/dL (ref 0.44–1.00)
GFR, Estimated: >60 mL/min (ref >60)
Glucose, Bld: 142 mg/dL — ABNORMAL HIGH (ref 70–99)
Potassium: 3.6 mmol/L (ref 3.5–5.1)
Sodium: 139 mmol/L (ref 135–145)
Total Bilirubin: 0.5 mg/dL (ref 0.3–1.2)
Total Protein: 6.8 g/dL (ref 6.5–8.1)

## 2022-05-04 LAB — CBC WITH DIFFERENTIAL (CANCER CENTER ONLY)
Abs Immature Granulocytes: 0.1 K/uL — ABNORMAL HIGH (ref 0.00–0.07)
Basophils Absolute: 0.1 K/uL (ref 0.0–0.1)
Basophils Relative: 1 %
Eosinophils Absolute: 0 K/uL (ref 0.0–0.5)
Eosinophils Relative: 1 %
HCT: 34 % — ABNORMAL LOW (ref 36.0–46.0)
Hemoglobin: 11.9 g/dL — ABNORMAL LOW (ref 12.0–15.0)
Immature Granulocytes: 2 %
Lymphocytes Relative: 34 %
Lymphs Abs: 2.1 K/uL (ref 0.7–4.0)
MCH: 31.6 pg (ref 26.0–34.0)
MCHC: 35 g/dL (ref 30.0–36.0)
MCV: 90.4 fL (ref 80.0–100.0)
Monocytes Absolute: 0.7 K/uL (ref 0.1–1.0)
Monocytes Relative: 11 %
Neutro Abs: 3.2 K/uL (ref 1.7–7.7)
Neutrophils Relative %: 51 %
Platelet Count: 297 K/uL (ref 150–400)
RBC: 3.76 MIL/uL — ABNORMAL LOW (ref 3.87–5.11)
RDW: 16.7 % — ABNORMAL HIGH (ref 11.5–15.5)
WBC Count: 6.2 K/uL (ref 4.0–10.5)
nRBC: 0 % (ref 0.0–0.2)

## 2022-05-04 MED ORDER — SODIUM CHLORIDE 0.9 % IV SOLN
468.5000 mg | Freq: Once | INTRAVENOUS | Status: AC
  Administered 2022-05-04: 470 mg via INTRAVENOUS
  Filled 2022-05-04: qty 47

## 2022-05-04 MED ORDER — SODIUM CHLORIDE 0.9 % IV SOLN
500.0000 mg/m2 | Freq: Once | INTRAVENOUS | Status: AC
  Administered 2022-05-04: 1000 mg via INTRAVENOUS
  Filled 2022-05-04: qty 40

## 2022-05-04 MED ORDER — SODIUM CHLORIDE 0.9 % IV SOLN
10.0000 mg | Freq: Once | INTRAVENOUS | Status: AC
  Administered 2022-05-04: 10 mg via INTRAVENOUS
  Filled 2022-05-04: qty 10

## 2022-05-04 MED ORDER — SODIUM CHLORIDE 0.9 % IV SOLN: Freq: Once | INTRAVENOUS | Status: AC

## 2022-05-04 MED ORDER — SODIUM CHLORIDE 0.9 % IV SOLN
150.0000 mg | Freq: Once | INTRAVENOUS | Status: AC
  Administered 2022-05-04: 150 mg via INTRAVENOUS
  Filled 2022-05-04: qty 150

## 2022-05-04 MED ORDER — PALONOSETRON HCL INJECTION 0.25 MG/5ML
0.2500 mg | Freq: Once | INTRAVENOUS | Status: AC
  Administered 2022-05-04: 0.25 mg via INTRAVENOUS
  Filled 2022-05-04: qty 5

## 2022-05-04 MED ORDER — SODIUM CHLORIDE 0.9 % IV SOLN
200.0000 mg | Freq: Once | INTRAVENOUS | Status: AC
  Administered 2022-05-04: 200 mg via INTRAVENOUS
  Filled 2022-05-04: qty 8

## 2022-05-04 MED ORDER — PANTOPRAZOLE SODIUM 40 MG PO TBEC: 40.0000 mg | DELAYED_RELEASE_TABLET | Freq: Every day | ORAL | 0 refills | Status: DC

## 2022-05-04 NOTE — Progress Notes (Signed)
Nutrition Assessment   Reason for Assessment: MST   ASSESSMENT: 72 year old female with stage IV non-small cell lung cancer. Patient completed SRS to multiple brain metastasis under the care of Dr. [name] 02/24/22. She is currently receiving systemic chemotherapy with carboplatin, alimta, + keytruda q21d. Patient followed by Dr. [name].   Met with patient during infusion. She is accompanied by her daughter-in-law today. Patient reports she has a good appetite and eats well most days. She has intermittent nausea (mostly in the morning) last a few days after treatment. Patient is taking antiemetics as needed. These work well for her. Patient says she is working to increase water intake. She denies constipation, diarrhea, altered taste.     Medications: zetia, folic acid, avapro, keppra, lopressor, zofran, protonix, compazine, tramadol   Labs: Hgb 11.9, glucose 142   Anthropometrics:   Height: 5'2" Weight: 188 lb 1.6 oz  UBW: 190 - 192 lb (per pt) BMI: 34.40   NUTRITION DIAGNOSIS: Food and nutrition related knowledge deficit related to cancer and associated treatment as evidenced by no prior need for associated nutrition education     INTERVENTION:  Continue taking antiemetics as needed for intermittent nausea Educated on foods best tolerated and foods to limit/avoid Encouraged small frequent meals and snacks with adequate calories and protein - handout with ideas provided  Contact information given   MONITORING, EVALUATION, GOAL: Patient will tolerate increased calories and protein to minimize weight loss during treatment   Next Visit: To be scheduled as needed.

## 2022-05-04 NOTE — Patient Instructions (Signed)
[facility] ONCOLOGY  Discharge Instructions: Thank you for choosing [facility] to provide your oncology and hematology care.   If you have a lab appointment with the [facility], please go directly to the [facility] and check in at the registration area.   Wear comfortable clothing and clothing appropriate for easy access to any Portacath or PICC line.   We strive to give you quality time with your provider. You may need to reschedule your appointment if you arrive late (15 or more minutes).  Arriving late affects you and other patients whose appointments are after yours.  Also, if you miss three or more appointments without notifying the office, you may be dismissed from the clinic at the provider's discretion.      For prescription refill requests, have your pharmacy contact our office and allow 72 hours for refills to be completed.    Today you received the following chemotherapy and/or immunotherapy agents: Keytruda, Alimta, Carboplatin   To help prevent nausea and vomiting after your treatment, we encourage you to take your nausea medication as directed.  BELOW ARE SYMPTOMS THAT SHOULD BE REPORTED IMMEDIATELY: *FEVER GREATER THAN 100.4 F (38 C) OR HIGHER *CHILLS OR SWEATING *NAUSEA AND VOMITING THAT IS NOT CONTROLLED WITH YOUR NAUSEA MEDICATION *UNUSUAL SHORTNESS OF BREATH *UNUSUAL BRUISING OR BLEEDING *URINARY PROBLEMS (pain or burning when urinating, or frequent urination) *BOWEL PROBLEMS (unusual diarrhea, constipation, pain near the anus) TENDERNESS IN MOUTH AND THROAT WITH OR WITHOUT PRESENCE OF ULCERS (sore throat, sores in mouth, or a toothache) UNUSUAL RASH, SWELLING OR PAIN  UNUSUAL VAGINAL DISCHARGE OR ITCHING   Items with * indicate a potential emergency and should be followed up as soon as possible or go to the Emergency Department if any problems should occur.  Please show the CHEMOTHERAPY ALERT CARD or IMMUNOTHERAPY ALERT  CARD at check-in to the Emergency Department and triage nurse.  Should you have questions after your visit or need to cancel or reschedule your appointment, please contact [facility]  Dept: [phone]  and follow the prompts.  Office hours are 8:00 a.m. to 4:30 p.m. Monday - Friday. Please note that voicemails left after 4:00 p.m. may not be returned until the following business day.  We are closed weekends and major holidays. You have access to a nurse at all times for urgent questions. Please call the main number to the clinic Dept: [phone] and follow the prompts.   For any non-urgent questions, you may also contact your provider using MyChart. We now offer e-Visits for anyone 65 and older to request care online for non-urgent symptoms. For details visit [email or web address].   Also download the MyChart app! Go to the app store, search "MyChart", open the app, select [facility], and log in with your MyChart username and password.  Masks are optional in the cancer centers. If you would like for your care team to wear a mask while they are taking care of you, please let them know. You may have one support person who is at least 74 years old accompany you for your appointments.

## 2022-05-05 ENCOUNTER — Other Ambulatory Visit: Payer: Self-pay

## 2022-05-07 ENCOUNTER — Telehealth: Payer: Self-pay | Admitting: Internal Medicine

## 2022-05-07 ENCOUNTER — Other Ambulatory Visit: Payer: Self-pay

## 2022-05-07 NOTE — Telephone Encounter (Signed)
Called patient regarding upcoming January-March appointments, left a voicemail.

## 2022-05-09 ENCOUNTER — Other Ambulatory Visit: Payer: Self-pay

## 2022-05-12 ENCOUNTER — Ambulatory Visit (HOSPITAL_COMMUNITY)
Admission: RE | Admit: 2022-05-12 | Discharge: 2022-05-12 | Disposition: A | Discharge status: Home / Self Care | Payer: Medicare Other | Source: Ambulatory Visit | Attending: Internal Medicine | Admitting: Internal Medicine

## 2022-05-12 DIAGNOSIS — C3490 Malignant neoplasm of unspecified part of unspecified bronchus or lung: Secondary | ICD-10-CM | POA: Diagnosis not present

## 2022-05-12 MED ORDER — IOHEXOL 300 MG/ML  SOLN
100.0000 mL | Freq: Once | INTRAMUSCULAR | Status: AC | PRN
  Administered 2022-05-12: 100 mL via INTRAVENOUS

## 2022-05-12 MED ORDER — SODIUM CHLORIDE (PF) 0.9 % IJ SOLN
INTRAMUSCULAR | Status: AC
  Filled 2022-05-12: qty 50

## 2022-05-14 ENCOUNTER — Encounter: Payer: Self-pay | Admitting: Physician Assistant

## 2022-05-14 ENCOUNTER — Telehealth: Payer: Self-pay | Admitting: Physician Assistant

## 2022-05-14 NOTE — Telephone Encounter (Signed)
I tried to call the patient yesterday and today to review her scan results.  Unable to reach her.  I left a voicemail to let her know I was going to review her scan and that there were not any concerns but if she wants to discuss in more detail she is free to call us back.  I will also send her a MyChart message.

## 2022-05-20 ENCOUNTER — Other Ambulatory Visit: Payer: Self-pay | Admitting: Internal Medicine

## 2022-05-24 ENCOUNTER — Inpatient Hospital Stay: Payer: Medicare Other | Admitting: Internal Medicine

## 2022-05-24 ENCOUNTER — Inpatient Hospital Stay: Payer: Medicare Other

## 2022-05-24 ENCOUNTER — Other Ambulatory Visit: Payer: Self-pay

## 2022-05-24 VITALS — BP 128/81 | HR 72 | Temp 98.2°F | Resp 17 | Wt 191.3 lb

## 2022-05-24 VITALS — BP 119/63 | HR 75 | Temp 98.6°F | Resp 23

## 2022-05-24 DIAGNOSIS — C3492 Malignant neoplasm of unspecified part of left bronchus or lung: Secondary | ICD-10-CM | POA: Diagnosis not present

## 2022-05-24 DIAGNOSIS — Z5111 Encounter for antineoplastic chemotherapy: Secondary | ICD-10-CM | POA: Diagnosis not present

## 2022-05-24 LAB — CBC WITH DIFFERENTIAL (CANCER CENTER ONLY)
Abs Immature Granulocytes: 0.02 10*3/uL (ref 0.00–0.07)
Basophils Absolute: 0 10*3/uL (ref 0.0–0.1)
Basophils Relative: 0 %
Eosinophils Absolute: 0.1 10*3/uL (ref 0.0–0.5)
Eosinophils Relative: 2 %
HCT: 31.2 % — ABNORMAL LOW (ref 36.0–46.0)
Hemoglobin: 10.9 g/dL — ABNORMAL LOW (ref 12.0–15.0)
Immature Granulocytes: 0 %
Lymphocytes Relative: 30 %
Lymphs Abs: 1.4 10*3/uL (ref 0.7–4.0)
MCH: 32.4 pg (ref 26.0–34.0)
MCHC: 34.9 g/dL (ref 30.0–36.0)
MCV: 92.9 fL (ref 80.0–100.0)
Monocytes Absolute: 0.5 10*3/uL (ref 0.1–1.0)
Monocytes Relative: 11 %
Neutro Abs: 2.5 10*3/uL (ref 1.7–7.7)
Neutrophils Relative %: 57 %
Platelet Count: 247 10*3/uL (ref 150–400)
RBC: 3.36 MIL/uL — ABNORMAL LOW (ref 3.87–5.11)
RDW: 18.2 % — ABNORMAL HIGH (ref 11.5–15.5)
WBC Count: 4.6 10*3/uL (ref 4.0–10.5)
nRBC: 0 % (ref 0.0–0.2)

## 2022-05-24 LAB — CMP (CANCER CENTER ONLY)
ALT: 24 U/L (ref 0–44)
AST: 22 U/L (ref 15–41)
Albumin: 3.6 g/dL (ref 3.5–5.0)
Alkaline Phosphatase: 73 U/L (ref 38–126)
Anion gap: 7 (ref 5–15)
BUN: 10 mg/dL (ref 8–23)
CO2: 25 mmol/L (ref 22–32)
Calcium: 9 mg/dL (ref 8.9–10.3)
Chloride: 106 mmol/L (ref 98–111)
Creatinine: 0.86 mg/dL (ref 0.44–1.00)
GFR, Estimated: >60 mL/min (ref >60)
Glucose, Bld: 106 mg/dL — ABNORMAL HIGH (ref 70–99)
Potassium: 3.7 mmol/L (ref 3.5–5.1)
Sodium: 138 mmol/L (ref 135–145)
Total Bilirubin: 0.4 mg/dL (ref 0.3–1.2)
Total Protein: 6.1 g/dL — ABNORMAL LOW (ref 6.5–8.1)

## 2022-05-24 MED ORDER — SODIUM CHLORIDE 0.9 % IV SOLN
500.0000 mg/m2 | Freq: Once | INTRAVENOUS | Status: AC
  Administered 2022-05-24: 1000 mg via INTRAVENOUS
  Filled 2022-05-24: qty 40

## 2022-05-24 MED ORDER — SODIUM CHLORIDE 0.9 % IV SOLN: Freq: Once | INTRAVENOUS | Status: AC

## 2022-05-24 MED ORDER — SODIUM CHLORIDE 0.9 % IV SOLN
200.0000 mg | Freq: Once | INTRAVENOUS | Status: AC
  Administered 2022-05-24: 200 mg via INTRAVENOUS
  Filled 2022-05-24: qty 200

## 2022-05-24 MED ORDER — PROCHLORPERAZINE MALEATE 10 MG PO TABS
10.0000 mg | ORAL_TABLET | Freq: Once | ORAL | Status: AC
  Administered 2022-05-24: 10 mg via ORAL
  Filled 2022-05-24: qty 1

## 2022-05-24 NOTE — Progress Notes (Signed)
[facility] [phone]   [phone]  OFFICE PROGRESS NOTE  Patient, No Pcp Per No address on file  DIAGNOSIS: Stage IV (T1c, N2, M1 C) non-small cell lung cancer, adenocarcinoma with positive KRAS G12C mutation diagnosed in September 2023 and presented with left upper lobe lung mass in addition to left hilar and mediastinal lymphadenopathy as well as innumerable brain metastasis.  PRIOR THERAPY: SRS to multiple brain metastasis under the care of Dr. Basilio Cairo on February 24, 2022.  CURRENT THERAPY: Palliative systemic chemotherapy with carboplatin for AUC of 5, Alimta 500 Mg/M2 and Keytruda 200 Mg IV every 3 weeks.  Status post 4 cycles.  Starting from cycle #5 the patient will be on maintenance treatment with Alimta and Keytruda every 3 weeks.  INTERVAL HISTORY: Kimberly Huang 72 y.o. female returns to the clinic today for follow-up visit accompanied by her daughter-in-law Chipper Oman.  The patient is feeling much better today with no concerning complaints except for mild fatigue.  She has no chest pain, shortness of breath, cough or hemoptysis.  She has no nausea, vomiting, diarrhea or constipation.  She has no headache or visual changes.  She has no recent weight loss or night sweats.  She has been tolerating her systemic chemotherapy fairly well.  She had repeat CT scan of the chest, abdomen and pelvis performed recently and she is here for evaluation and discussion of her scan results.  MEDICAL HISTORY: Past Medical History:  Diagnosis Date   Arthritis    knee, hands   CAD S/P PCI OM1 99%-0%; Plan Staged PCI mRCA 90%. 02/19/2017   1. Severe 2 vessel obstructive CAD    - 99% thrombotic occlusion of first OM. This is a bifurcating vessel.     - 90% mid RCA-segmental 2. Good overall LV dysfunction with lateral HK 3. Moderately elevated LVEDP 4. Successful stenting of the first OM with DES. Distal embolization into the distal lateral OM branch.   Plan:  DAPT for one year. Will treat with IV Aggrastat for 18 hours. Start high dose statin, beta blocker. IV Ntg for BP control acutely. Plan for stage PCI of RCA on Monday 10/22 if no complication.   Essential hypertension 02/21/2017   Hyperlipidemia with target LDL less than 70 02/21/2017   Lung cancer (HCC)    Presence of drug-eluting stent in L Cx: OM1 (Xience SIERRA DES 3.5 x 15) 02/21/2017   STEMI involving left circumflex coronary artery (HCC) 02/19/2017   Tobacco abuse 02/19/2017    ALLERGIES:  is allergic to codeine.  MEDICATIONS:  Current Outpatient Medications  Medication Sig Dispense Refill   acetaminophen (TYLENOL) 500 MG tablet Take 500 mg by mouth as needed for moderate pain.     aspirin 81 MG chewable tablet Chew 1 tablet (81 mg total) by mouth daily.     atorvastatin (LIPITOR) 80 MG tablet TAKE 1 TABLET BY MOUTH DAILY AT 6 PM. (Patient taking differently: Take 80 mg by mouth daily.) 90 tablet 3   Coenzyme Q10 (CO Q 10) 100 MG CAPS Take 100 mg by mouth every evening.     diclofenac Sodium (VOLTAREN) 1 % GEL Apply 1 Application topically daily as needed (Knee pain).     ezetimibe (ZETIA) 10 MG tablet TAKE 1 TABLET BY MOUTH EVERY DAY 90 tablet 0   folic acid (FOLVITE) 1 MG tablet TAKE 1 TABLET BY MOUTH EVERY DAY 90 tablet 1   irbesartan (AVAPRO) 300 MG tablet TAKE 1 TABLET  BY MOUTH DAILY. PLEASE CONTACT THE OFFICE TO SCHEDULE APPOINTMENT 90 tablet 0   levETIRAcetam (KEPPRA) 500 MG tablet Take 1 tablet (500 mg total) by mouth 2 (two) times daily. 60 tablet 2   metoprolol tartrate (LOPRESSOR) 25 MG tablet TAKE 1 TABLET BY MOUTH TWICE A DAY 180 tablet 0   nitroGLYCERIN (NITROSTAT) 0.4 MG SL tablet Place 1 tablet (0.4 mg total) under the tongue every 5 (five) minutes x 3 doses as needed for chest pain. 25 tablet 2   ondansetron (ZOFRAN) 8 MG tablet Take 1 tablet (8 mg total) by mouth every 8 (eight) hours as needed for nausea or vomiting. Starting day 3 after chemotherapy (Patient taking  differently: Take 8 mg by mouth as needed for nausea or vomiting.) 30 tablet 2   pantoprazole (PROTONIX) 40 MG tablet Take 1 tablet (40 mg total) by mouth daily. 30 tablet 0   prochlorperazine (COMPAZINE) 10 MG tablet Take 1 tablet (10 mg total) by mouth every 6 (six) hours as needed for nausea or vomiting. (Patient taking differently: Take 10 mg by mouth as needed for nausea or vomiting.) 30 tablet 2   traMADol (ULTRAM) 50 MG tablet Take 50 mg by mouth daily as needed for moderate pain.     No current facility-administered medications for this visit.    SURGICAL HISTORY:  Past Surgical History:  Procedure Laterality Date   BRONCHIAL BIOPSY  01/15/2022   Procedure: BRONCHIAL BIOPSIES;  Surgeon: Omar Person, MD;  Location: [facility] ENDOSCOPY;  Service: Pulmonary;;   BRONCHIAL BRUSHINGS  01/15/2022   Procedure: BRONCHIAL BRUSHINGS;  Surgeon: Omar Person, MD;  Location: [facility] ENDOSCOPY;  Service: Pulmonary;;   BRONCHIAL NEEDLE ASPIRATION BIOPSY  01/15/2022   Procedure: BRONCHIAL NEEDLE ASPIRATION BIOPSIES;  Surgeon: Omar Person, MD;  Location: [facility] ENDOSCOPY;  Service: Pulmonary;;   CARDIAC CATHETERIZATION  02/21/2017   CHOLECYSTECTOMY     CORONARY STENT INTERVENTION N/A 02/19/2017   Procedure: CORONARY STENT INTERVENTION;  Surgeon: Swaziland, Peter M, MD;  Location: [facility] INVASIVE CV LAB;  Service: Cardiovascular;  Laterality: N/A;   CORONARY STENT INTERVENTION N/A 02/21/2017   Procedure: CORONARY STENT INTERVENTION;  Surgeon: Lyn Records, MD;  Location: [facility] INVASIVE CV LAB;  Service: Cardiovascular;  Laterality: N/A;   FIDUCIAL MARKER PLACEMENT  01/15/2022   Procedure: FIDUCIAL MARKER PLACEMENT;  Surgeon: Omar Person, MD;  Location: [facility] ENDOSCOPY;  Service: Pulmonary;;   FINE NEEDLE ASPIRATION  01/15/2022   Procedure: FINE NEEDLE ASPIRATION;  Surgeon: Omar Person, MD;  Location: [facility] ENDOSCOPY;  Service: Pulmonary;;   HEMOSTASIS CONTROL  01/15/2022   Procedure: HEMOSTASIS  CONTROL;  Surgeon: Omar Person, MD;  Location: [facility] ENDOSCOPY;  Service: Pulmonary;;   KNEE ARTHROSCOPY Right 2010   KNEE ARTHROSCOPY WITH SUBCHONDROPLASTY Left 01/09/2020   Procedure: Left knee arthroscopy,partial medial meniscectomy, debridement, subchondroplasty left proximal tibia;  Surgeon: Jene Every, MD;  Location: [name];  Service: Orthopedics;  Laterality: Left;   LEFT HEART CATH AND CORONARY ANGIOGRAPHY N/A 02/19/2017   Procedure: LEFT HEART CATH AND CORONARY ANGIOGRAPHY;  Surgeon: Swaziland, Peter M, MD;  Location: [facility] INVASIVE CV LAB;  Service: Cardiovascular;  Laterality: N/A;   TONSILLECTOMY     TUBAL LIGATION     VIDEO BRONCHOSCOPY WITH ENDOBRONCHIAL ULTRASOUND N/A 01/15/2022   Procedure: VIDEO BRONCHOSCOPY WITH ENDOBRONCHIAL ULTRASOUND;  Surgeon: Omar Person, MD;  Location: [facility] ENDOSCOPY;  Service: Pulmonary;  Laterality: N/A;   VIDEO BRONCHOSCOPY WITH RADIAL ENDOBRONCHIAL ULTRASOUND  01/15/2022   Procedure: RADIAL  ENDOBRONCHIAL ULTRASOUND;  Surgeon: Omar Person, MD;  Location: [facility] ENDOSCOPY;  Service: Pulmonary;;    REVIEW OF SYSTEMS:  Constitutional: positive for fatigue Eyes: negative Ears, nose, mouth, throat, and face: negative Respiratory: negative Cardiovascular: negative Gastrointestinal: negative Genitourinary:negative Integument/breast: negative Hematologic/lymphatic: negative Musculoskeletal:negative Neurological: negative Behavioral/Psych: negative Endocrine: negative Allergic/Immunologic: negative   PHYSICAL EXAMINATION: General appearance: alert, cooperative, fatigued, and no distress Head: Normocephalic, without obvious abnormality, atraumatic Neck: no adenopathy, no JVD, supple, symmetrical, trachea midline, and thyroid not enlarged, symmetric, no tenderness/mass/nodules Lymph nodes: Cervical, supraclavicular, and axillary nodes normal. Resp: clear to auscultation bilaterally Back: symmetric, no curvature. ROM normal. No CVA  tenderness. Cardio: regular rate and rhythm, S1, S2 normal, no murmur, click, rub or gallop GI: soft, non-tender; bowel sounds normal; no masses,  no organomegaly Extremities: extremities normal, atraumatic, no cyanosis or edema Neurologic: Alert and oriented X 3, normal strength and tone. Normal symmetric reflexes. Normal coordination and gait  ECOG PERFORMANCE STATUS: 1 - Symptomatic but completely ambulatory  Blood pressure 128/81, pulse 72, temperature 98.2 F (36.8 C), temperature source Oral, resp. rate 17, weight 191 lb 5 oz (86.8 kg), SpO2 98 %.  LABORATORY DATA: Lab Results  Component Value Date   WBC 4.6 05/24/2022   HGB 10.9 (L) 05/24/2022   HCT 31.2 (L) 05/24/2022   MCV 92.9 05/24/2022   PLT 247 05/24/2022      Chemistry      Component Value Date/Time   NA 138 05/24/2022 0958   NA 143 12/08/2020 1620   K 3.7 05/24/2022 0958   CL 106 05/24/2022 0958   CO2 25 05/24/2022 0958   BUN 10 05/24/2022 0958   BUN 11 12/08/2020 1620   CREATININE 0.86 05/24/2022 0958      Component Value Date/Time   CALCIUM 9.0 05/24/2022 0958   ALKPHOS 73 05/24/2022 0958   AST 22 05/24/2022 0958   ALT 24 05/24/2022 0958   BILITOT 0.4 05/24/2022 0958       RADIOGRAPHIC STUDIES: CT Chest W Contrast  Result Date: 05/13/2022 CLINICAL DATA:  Non-small-cell lung cancer staging. Adenocarcinoma. * Tracking Code: BO * EXAM: CT CHEST, ABDOMEN, AND PELVIS WITH CONTRAST TECHNIQUE: Multidetector CT imaging of the chest, abdomen and pelvis was performed following the standard protocol during bolus administration of intravenous contrast. RADIATION DOSE REDUCTION: This exam was performed according to the departmental dose-optimization program which includes automated exposure control, adjustment of the mA and/or kV according to patient size and/or use of iterative reconstruction technique. CONTRAST:  OMNIPAQUE IOHEXOL 300 MG/ML  SOLN COMPARISON:  PET-CT 01/08/2022. Old CT scan chest 01/08/2022  and older FINDINGS: CT CHEST FINDINGS Cardiovascular: Heart nonenlarged. Coronary artery calcifications are identified. No significant pericardial effusion. Normal caliber thoracic aorta. Scattered vascular calcifications. Some noncalcified irregular plaque as well. Mediastinum/Nodes: There is no specific abnormal lymph node enlargement seen in the axillary region, hilum and mediastinum. There are some calcified lymph nodes in the mediastinum, not pathologic by size criteria. Previously there was a left hilar node which is hypermetabolic measuring 12 mm in short axis. Small node in this location today on series 2 image 25 measures 9 by 7 mm. The previous hypermetabolic mediastinal node left paratracheal today essentially absent. Only minimal soft tissue thickening identified in this location on series 2, image 21. Slightly patulous thoracic esophagus. Small hiatal hernia. Lungs/Pleura: There is some atelectasis scarring identified along the middle lobe. No consolidation, pneumothorax or effusion. 3 mm nodule identified in the lingula on image 79 of series  4 stable. The more masslike area in the posterior left upper lobe abutting the interlobar fissure which was hypermetabolic on the PET-CT scan is again seen. Previously this measured 2.9 x 2.7 cm. Today on series 4, image 59 2.7 by 1.5 cm. There is an adjacent slightly medially inferior fiduciary marker. No new dominant lung nodule. Musculoskeletal: Slight curvature of the spine with some degenerative change. Presumed areas of spinal hemangiomas along vertebral bodies particularly at T12. If there is concern of osseous metastatic disease, bone scan can be performed as clinically indicated. CT ABDOMEN PELVIS FINDINGS Hepatobiliary: Diffuse fatty liver infiltration identified. No definite space-occupying liver lesion. Patent portal vein. Previous cholecystectomy. Some fatty sparing identified dependently in the left lobe lateral segment. Pancreas: Preserved pancreatic  parenchyma. No ductal dilatation or obvious mass. Spleen: Splenic granulomas are seen.  The spleen is nonenlarged. Adrenals/Urinary Tract: As seen previously there are bilateral adrenal nodules which were felt to be adenomas and are unchanged from previous examination. Left-sided focus today once again has dimensions proximally 3.4 by 2.0 cm and right 2.3 by 1.6 cm. Again similar when adjusted for measurement variation. Mild bilateral renal atrophy. Large central Bosniak 1 bone left-sided renal cysts identified. Small right-sided cysts. No specific imaging follow-up of the spine dynamic 1 lesions. There is once again a nonobstructing lower pole stone. No collecting system dilatation. Preserved contours of the urinary bladder. Stomach/Bowel: The stomach and small bowel are nondilated. No free air or free fluid. Proximal duodenal diverticula identified. Normal appendix. Vascular/Lymphatic: Normal caliber aorta and IVC with scattered atherosclerotic changes. There is a small area of slight ectasia along the abdominal aorta which is saccular left at the origin of the left renal artery. Diameter at this location of the renal artery origin is 11 mm on coronal image 63. Mild associated plaque. There is scattered calcified plaque along the iliac vessels as well. Reproductive: Uterus and bilateral adnexa are unremarkable. Other: No abdominal wall hernia or abnormality. No abdominopelvic ascites. Musculoskeletal: Scattered degenerative changes seen of the spine and pelvis. IMPRESSION: Significant interval improvement in the left hilar and adjacent mediastinal lymph nodes. The primary lesion in the left lung is slightly decreased in size as well. No new mass lesion, fluid collection or lymph node enlargement. Fatty liver infiltration. Nonobstructing left-sided renal stone. Benign bilateral renal cysts. Adrenal adenomas. Focal saccular dilatation of the origin of the left renal artery measuring up to 11 mm. Electronically  Signed   By: Karen Kays M.D.   On: 05/13/2022 12:59   CT Abdomen Pelvis W Contrast  Result Date: 05/13/2022 CLINICAL DATA:  Non-small-cell lung cancer staging. Adenocarcinoma. * Tracking Code: BO * EXAM: CT CHEST, ABDOMEN, AND PELVIS WITH CONTRAST TECHNIQUE: Multidetector CT imaging of the chest, abdomen and pelvis was performed following the standard protocol during bolus administration of intravenous contrast. RADIATION DOSE REDUCTION: This exam was performed according to the departmental dose-optimization program which includes automated exposure control, adjustment of the mA and/or kV according to patient size and/or use of iterative reconstruction technique. CONTRAST:  OMNIPAQUE IOHEXOL 300 MG/ML  SOLN COMPARISON:  PET-CT 01/08/2022. Old CT scan chest 01/08/2022 and older FINDINGS: CT CHEST FINDINGS Cardiovascular: Heart nonenlarged. Coronary artery calcifications are identified. No significant pericardial effusion. Normal caliber thoracic aorta. Scattered vascular calcifications. Some noncalcified irregular plaque as well. Mediastinum/Nodes: There is no specific abnormal lymph node enlargement seen in the axillary region, hilum and mediastinum. There are some calcified lymph nodes in the mediastinum, not pathologic by size criteria. Previously there  was a left hilar node which is hypermetabolic measuring 12 mm in short axis. Small node in this location today on series 2 image 25 measures 9 by 7 mm. The previous hypermetabolic mediastinal node left paratracheal today essentially absent. Only minimal soft tissue thickening identified in this location on series 2, image 21. Slightly patulous thoracic esophagus. Small hiatal hernia. Lungs/Pleura: There is some atelectasis scarring identified along the middle lobe. No consolidation, pneumothorax or effusion. 3 mm nodule identified in the lingula on image 79 of series 4 stable. The more masslike area in the posterior left upper lobe abutting the  interlobar fissure which was hypermetabolic on the PET-CT scan is again seen. Previously this measured 2.9 x 2.7 cm. Today on series 4, image 59 2.7 by 1.5 cm. There is an adjacent slightly medially inferior fiduciary marker. No new dominant lung nodule. Musculoskeletal: Slight curvature of the spine with some degenerative change. Presumed areas of spinal hemangiomas along vertebral bodies particularly at T12. If there is concern of osseous metastatic disease, bone scan can be performed as clinically indicated. CT ABDOMEN PELVIS FINDINGS Hepatobiliary: Diffuse fatty liver infiltration identified. No definite space-occupying liver lesion. Patent portal vein. Previous cholecystectomy. Some fatty sparing identified dependently in the left lobe lateral segment. Pancreas: Preserved pancreatic parenchyma. No ductal dilatation or obvious mass. Spleen: Splenic granulomas are seen.  The spleen is nonenlarged. Adrenals/Urinary Tract: As seen previously there are bilateral adrenal nodules which were felt to be adenomas and are unchanged from previous examination. Left-sided focus today once again has dimensions proximally 3.4 by 2.0 cm and right 2.3 by 1.6 cm. Again similar when adjusted for measurement variation. Mild bilateral renal atrophy. Large central Bosniak 1 bone left-sided renal cysts identified. Small right-sided cysts. No specific imaging follow-up of the spine dynamic 1 lesions. There is once again a nonobstructing lower pole stone. No collecting system dilatation. Preserved contours of the urinary bladder. Stomach/Bowel: The stomach and small bowel are nondilated. No free air or free fluid. Proximal duodenal diverticula identified. Normal appendix. Vascular/Lymphatic: Normal caliber aorta and IVC with scattered atherosclerotic changes. There is a small area of slight ectasia along the abdominal aorta which is saccular left at the origin of the left renal artery. Diameter at this location of the renal artery  origin is 11 mm on coronal image 63. Mild associated plaque. There is scattered calcified plaque along the iliac vessels as well. Reproductive: Uterus and bilateral adnexa are unremarkable. Other: No abdominal wall hernia or abnormality. No abdominopelvic ascites. Musculoskeletal: Scattered degenerative changes seen of the spine and pelvis. IMPRESSION: Significant interval improvement in the left hilar and adjacent mediastinal lymph nodes. The primary lesion in the left lung is slightly decreased in size as well. No new mass lesion, fluid collection or lymph node enlargement. Fatty liver infiltration. Nonobstructing left-sided renal stone. Benign bilateral renal cysts. Adrenal adenomas. Focal saccular dilatation of the origin of the left renal artery measuring up to 11 mm. Electronically Signed   By: Karen Kays M.D.   On: 05/13/2022 12:59    ASSESSMENT AND PLAN: This is a very pleasant 72 years old white female with Stage IV (T1c, N2, M1 C) non-small cell lung cancer, adenocarcinoma with positive KRAS G12C mutation diagnosed in September 2023 and presented with left upper lobe lung mass in addition to left hilar and mediastinal lymphadenopathy as well as innumerable brain metastasis. The patient is scheduled for SRS to multiple brain metastasis on February 24, 2022. The patient is currently undergoing systemic chemotherapy with  carboplatin for AUC of 5, Alimta 500 Mg/M2 and Keytruda 200 Mg IV every 3 weeks status post 4 cycles.  Starting from cycle #5 she is on maintenance treatment with Alimta and Keytruda every 3 weeks. She had repeat CT scan of the chest, abdomen and pelvis performed recently.  I personally and independently reviewed the scan and discussed the results with the patient and her daughter-in-law. Her scan showed significant improvement in the left hilar and mediastinal lymphadenopathy as well as the primary left lung lesion. I recommended for the patient to continue her treatment and she will  proceed with the first cycle of her maintenance therapy with Alimta and Keytruda every 3 weeks starting from cycle #5 today. She will come back for follow-up visit in 3 weeks for evaluation before starting cycle #6. For the history of brain metastasis, she is scheduled to have repeat MRI of the brain soon and follow-up with radiation oncology and Dr. Barbaraann Cao. The patient was advised to call immediately if she has any other concerning symptoms in the interval.  The patient voices understanding of current disease status and treatment options and is in agreement with the current care plan.  All questions were answered. The patient knows to call the clinic with any problems, questions or concerns. We can certainly see the patient much sooner if necessary.  The total time spent in the appointment was 35 minutes.  Disclaimer: This note was dictated with voice recognition software. Similar sounding words can inadvertently be transcribed and may not be corrected upon review.

## 2022-05-24 NOTE — Patient Instructions (Signed)
[facility]  Discharge Instructions: Thank you for choosing [facility] to provide your oncology and hematology care.   If you have a lab appointment with the Cancer Center, please go directly to the Cancer Center and check in at the registration area.   Wear comfortable clothing and clothing appropriate for easy access to any Portacath or PICC line.   We strive to give you quality time with your provider. You may need to reschedule your appointment if you arrive late (15 or more minutes).  Arriving late affects you and other patients whose appointments are after yours.  Also, if you miss three or more appointments without notifying the office, you may be dismissed from the clinic at the provider's discretion.      For prescription refill requests, have your pharmacy contact our office and allow 72 hours for refills to be completed.    Today you received the following chemotherapy and/or immunotherapy agents: Keytruda, Alimta      To help prevent nausea and vomiting after your treatment, we encourage you to take your nausea medication as directed.  BELOW ARE SYMPTOMS THAT SHOULD BE REPORTED IMMEDIATELY: *FEVER GREATER THAN 100.4 F (38 C) OR HIGHER *CHILLS OR SWEATING *NAUSEA AND VOMITING THAT IS NOT CONTROLLED WITH YOUR NAUSEA MEDICATION *UNUSUAL SHORTNESS OF BREATH *UNUSUAL BRUISING OR BLEEDING *URINARY PROBLEMS (pain or burning when urinating, or frequent urination) *BOWEL PROBLEMS (unusual diarrhea, constipation, pain near the anus) TENDERNESS IN MOUTH AND THROAT WITH OR WITHOUT PRESENCE OF ULCERS (sore throat, sores in mouth, or a toothache) UNUSUAL RASH, SWELLING OR PAIN  UNUSUAL VAGINAL DISCHARGE OR ITCHING   Items with * indicate a potential emergency and should be followed up as soon as possible or go to the Emergency Department if any problems should occur.  Please show the CHEMOTHERAPY ALERT CARD or IMMUNOTHERAPY ALERT CARD  at check-in to the Emergency Department and triage nurse.  Should you have questions after your visit or need to cancel or reschedule your appointment, please contact [facility]  Dept: [phone]  and follow the prompts.  Office hours are 8:00 a.m. to 4:30 p.m. Monday - Friday. Please note that voicemails left after 4:00 p.m. may not be returned until the following business day.  We are closed weekends and major holidays. You have access to a nurse at all times for urgent questions. Please call the main number to the clinic Dept: [phone] and follow the prompts.   For any non-urgent questions, you may also contact your provider using MyChart. We now offer e-Visits for anyone 60 and older to request care online for non-urgent symptoms. For details visit [email or web address].   Also download the MyChart app! Go to the app store, search "MyChart", open the app, select [facility], and log in with your MyChart username and password.  Pembrolizumab Injection What is this medication? PEMBROLIZUMAB (PEM broe LIZ ue mab) treats some types of cancer. It works by helping your immune system slow or stop the spread of cancer cells. It is a monoclonal antibody. This medicine may be used for other purposes; ask your health care provider or pharmacist if you have questions. COMMON BRAND NAME(S): Keytruda What should I tell my care team before I take this medication? They need to know if you have any of these conditions: Allogeneic stem cell transplant (uses someone else's stem cells) Autoimmune diseases, such as Crohn disease, ulcerative colitis, lupus History of chest radiation  Nervous system problems, such as Guillain-Barre syndrome, myasthenia gravis Organ transplant An unusual or allergic reaction to pembrolizumab, other medications, foods, dyes, or preservatives Pregnant or trying to get pregnant Breast-feeding How should I use this medication? This  medication is injected into a vein. It is given by your care team in a hospital or clinic setting. A special MedGuide will be given to you before each treatment. Be sure to read this information carefully each time. Talk to your care team about the use of this medication in children. While it may be prescribed for children as young as 6 months for selected conditions, precautions do apply. Overdosage: If you think you have taken too much of this medicine contact a poison control center or emergency room at once. NOTE: This medicine is only for you. Do not share this medicine with others. What if I miss a dose? Keep appointments for follow-up doses. It is important not to miss your dose. Call your care team if you are unable to keep an appointment. What may interact with this medication? Interactions have not been studied. This list may not describe all possible interactions. Give your health care provider a list of all the medicines, herbs, non-prescription drugs, or dietary supplements you use. Also tell them if you smoke, drink alcohol, or use illegal drugs. Some items may interact with your medicine. What should I watch for while using this medication? Your condition will be monitored carefully while you are receiving this medication. You may need blood work while taking this medication. This medication may cause serious skin reactions. They can happen weeks to months after starting the medication. Contact your care team right away if you notice fevers or flu-like symptoms with a rash. The rash may be red or purple and then turn into blisters or peeling of the skin. You may also notice a red rash with swelling of the face, lips, or lymph nodes in your neck or under your arms. Tell your care team right away if you have any change in your eyesight. Talk to your care team if you may be pregnant. Serious birth defects can occur if you take this medication during pregnancy and for 4 months after the last  dose. You will need a negative pregnancy test before starting this medication. Contraception is recommended while taking this medication and for 4 months after the last dose. Your care team can help you find the option that works for you. Do not breastfeed while taking this medication and for 4 months after the last dose. What side effects may I notice from receiving this medication? Side effects that you should report to your care team as soon as possible: Allergic reactions--skin rash, itching, hives, swelling of the face, lips, tongue, or throat Dry cough, shortness of breath or trouble breathing Eye pain, redness, irritation, or discharge with blurry or decreased vision Heart muscle inflammation--unusual weakness or fatigue, shortness of breath, chest pain, fast or irregular heartbeat, dizziness, swelling of the ankles, feet, or hands Hormone gland problems--headache, sensitivity to light, unusual weakness or fatigue, dizziness, fast or irregular heartbeat, increased sensitivity to cold or heat, excessive sweating, constipation, hair loss, increased thirst or amount of urine, tremors or shaking, irritability Infusion reactions--chest pain, shortness of breath or trouble breathing, feeling faint or lightheaded Kidney injury (glomerulonephritis)--decrease in the amount of urine, red or dark brown urine, foamy or bubbly urine, swelling of the ankles, hands, or feet Liver injury--right upper belly pain, loss of appetite, nausea, light-colored stool, dark yellow  or brown urine, yellowing skin or eyes, unusual weakness or fatigue Pain, tingling, or numbness in the hands or feet, muscle weakness, change in vision, confusion or trouble speaking, loss of balance or coordination, trouble walking, seizures Rash, fever, and swollen lymph nodes Redness, blistering, peeling, or loosening of the skin, including inside the mouth Sudden or severe stomach pain, bloody diarrhea, fever, nausea, vomiting Side effects  that usually do not require medical attention (report to your care team if they continue or are bothersome): Bone, joint, or muscle pain Diarrhea Fatigue Loss of appetite Nausea Skin rash This list may not describe all possible side effects. Call your doctor for medical advice about side effects. You may report side effects to FDA at 1-800-FDA-1088. Where should I keep my medication? This medication is given in a hospital or clinic. It will not be stored at home. NOTE: This sheet is a summary. It may not cover all possible information. If you have questions about this medicine, talk to your doctor, pharmacist, or health care provider.  2023 Elsevier/Gold Standard (2013-01-08 00:00:00)  Pemetrexed Injection What is this medication? PEMETREXED (PEM e TREX ed) treats some types of cancer. It works by slowing down the growth of cancer cells. This medicine may be used for other purposes; ask your health care provider or pharmacist if you have questions. COMMON BRAND NAME(S): Alimta, PEMFEXY What should I tell my care team before I take this medication? They need to know if you have any of these conditions: Infection, such as chickenpox, cold sores, or herpes Kidney disease Low blood cell levels (white cells, red cells, and platelets) Lung or breathing disease, such as asthma Radiation therapy An unusual or allergic reaction to pemetrexed, other medications, foods, dyes, or preservatives If you or your partner are pregnant or trying to get pregnant Breast-feeding How should I use this medication? This medication is injected into a vein. It is given by your care team in a hospital or clinic setting. Talk to your care team about the use of this medication in children. Special care may be needed. Overdosage: If you think you have taken too much of this medicine contact a poison control center or emergency room at once. NOTE: This medicine is only for you. Do not share this medicine with  others. What if I miss a dose? Keep appointments for follow-up doses. It is important not to miss your dose. Call your care team if you are unable to keep an appointment. What may interact with this medication? Do not take this medication with any of the following: Live virus vaccines This medication may also interact with the following: Ibuprofen This list may not describe all possible interactions. Give your health care provider a list of all the medicines, herbs, non-prescription drugs, or dietary supplements you use. Also tell them if you smoke, drink alcohol, or use illegal drugs. Some items may interact with your medicine. What should I watch for while using this medication? Your condition will be monitored carefully while you are receiving this medication. This medication may make you feel generally unwell. This is not uncommon as chemotherapy can affect healthy cells as well as cancer cells. Report any side effects. Continue your course of treatment even though you feel ill unless your care team tells you to stop. This medication can cause serious side effects. To reduce the risk, your care team may give you other medications to take before receiving this one. Be sure to follow the directions from your care team. This  medication can cause a rash or redness in areas of the body that have previously had radiation therapy. If you have had radiation therapy, tell your care team if you notice a rash in this area. This medication may increase your risk of getting an infection. Call your care team for advice if you get a fever, chills, sore throat, or other symptoms of a cold or flu. Do not treat yourself. Try to avoid being around people who are sick. Be careful brushing or flossing your teeth or using a toothpick because you may get an infection or bleed more easily. If you have any dental work done, tell your dentist you are receiving this medication. Avoid taking medications that contain  aspirin, acetaminophen, ibuprofen, naproxen, or ketoprofen unless instructed by your care team. These medications may hide a fever. Check with your care team if you have severe diarrhea, nausea, and vomiting, or if you sweat a lot. The loss of too much body fluid may make it dangerous for you to take this medication. Talk to your care team if you or your partner wish to become pregnant or think either of you might be pregnant. This medication can cause serious birth defects if taken during pregnancy and for 6 months after the last dose. A negative pregnancy test is required before starting this medication. A reliable form of contraception is recommended while taking this medication and for 6 months after the last dose. Talk to your care team about reliable forms of contraception. Do not father a child while taking this medication and for 3 months after the last dose. Use a condom while having sex during this time period. Do not breastfeed while taking this medication and for 1 week after the last dose. This medication may cause infertility. Talk to your care team if you are concerned about your fertility. What side effects may I notice from receiving this medication? Side effects that you should report to your care team as soon as possible: Allergic reactions--skin rash, itching, hives, swelling of the face, lips, tongue, or throat Dry cough, shortness of breath or trouble breathing Infection--fever, chills, cough, sore throat, wounds that don't heal, pain or trouble when passing urine, general feeling of discomfort or being unwell Kidney injury--decrease in the amount of urine, swelling of the ankles, hands, or feet Low red blood cell level--unusual weakness or fatigue, dizziness, headache, trouble breathing Redness, blistering, peeling, or loosening of the skin, including inside the mouth Unusual bruising or bleeding Side effects that usually do not require medical attention (report to your care team  if they continue or are bothersome): Fatigue Loss of appetite Nausea Vomiting This list may not describe all possible side effects. Call your doctor for medical advice about side effects. You may report side effects to FDA at 1-800-FDA-1088. Where should I keep my medication? This medication is given in a hospital or clinic. It will not be stored at home. NOTE: This sheet is a summary. It may not cover all possible information. If you have questions about this medicine, talk to your doctor, pharmacist, or health care provider.  2023 Elsevier/Gold Standard (2021-08-24 00:00:00)

## 2022-05-27 ENCOUNTER — Other Ambulatory Visit: Payer: Medicare Other

## 2022-05-28 ENCOUNTER — Other Ambulatory Visit: Payer: Self-pay | Admitting: Physician Assistant

## 2022-05-28 ENCOUNTER — Telehealth: Payer: Self-pay | Admitting: Internal Medicine

## 2022-05-28 DIAGNOSIS — C3492 Malignant neoplasm of unspecified part of left bronchus or lung: Secondary | ICD-10-CM

## 2022-05-28 NOTE — Telephone Encounter (Signed)
Called patient to r/s 1/29 appointment per 1/26 in basket. Left voicemail with new appointment information.

## 2022-05-31 ENCOUNTER — Inpatient Hospital Stay: Payer: Medicare Other | Admitting: Internal Medicine

## 2022-06-01 ENCOUNTER — Inpatient Hospital Stay (HOSPITAL_BASED_OUTPATIENT_CLINIC_OR_DEPARTMENT_OTHER): Payer: Medicare Other | Admitting: Physician Assistant

## 2022-06-01 ENCOUNTER — Other Ambulatory Visit: Payer: Self-pay | Admitting: Physician Assistant

## 2022-06-01 VITALS — BP 120/67 | HR 74 | Temp 97.7°F | Resp 18 | Wt 186.4 lb

## 2022-06-01 DIAGNOSIS — M7989 Other specified soft tissue disorders: Secondary | ICD-10-CM

## 2022-06-01 DIAGNOSIS — C3492 Malignant neoplasm of unspecified part of left bronchus or lung: Secondary | ICD-10-CM | POA: Diagnosis not present

## 2022-06-01 DIAGNOSIS — Z5111 Encounter for antineoplastic chemotherapy: Secondary | ICD-10-CM | POA: Diagnosis not present

## 2022-06-01 MED ORDER — FOLIC ACID 1 MG PO TABS: 1.0000 mg | ORAL_TABLET | Freq: Every day | ORAL | 1 refills | Status: DC

## 2022-06-01 MED ORDER — CEPHALEXIN 500 MG PO CAPS: 500.0000 mg | ORAL_CAPSULE | Freq: Four times a day (QID) | ORAL | 0 refills | Status: DC

## 2022-06-01 NOTE — Progress Notes (Signed)
[facility]   Patient, No Pcp Per No address on file  DIAGNOSIS: Stage IV (T1c, N2, M1 C) non-small cell lung cancer, adenocarcinoma with positive KRAS G12C mutation diagnosed in September 2023 and presented with left upper lobe lung mass in addition to left hilar and mediastinal lymphadenopathy as well as innumerable brain metastasis.    PDL1 Expression: 93%   PRIOR THERAPY: SRS to multiple brain metastasis under the care of Dr. [name] on February 24, 2022   CURRENT THERAPY:  Palliative systemic chemotherapy with carboplatin for AUC of 5, Alimta 500 Mg/M2 and Keytruda 200 Mg IV every 3 weeks. Status post 5 cycles. Starting from cycle #5, she started maintenance alimta and keytruda IV every 3 weeks    INTERVAL HISTORY: PEARLA MCKINNY 72 y.o. female returns to the clinic today for a acute visit accompanied by her daughter-in-law.  The patient developed new bilateral ankle and dorsal foot swelling last week on Wednesday.  Her symptoms are about the same since the onset.  Her symptoms do not improve in the morning with elevation at night.  She never had these symptoms before. She has some mild erythema over the tops of her feet bilaterally.  The swelling is slightly worse in the left foot.  The patient mentions she does have a history of what sounds like is a Baker's cyst on the left leg.  She also mentions that she has a history of knee surgery in the left knee.  She denies any systemic symptoms such as fevers.  She reports she is drinking plenty of fluids and urinating appropriately.  She does mention that she has been eating salty food recently.  The edema does not pit.  Denies any warmth over the skin.  Denies any calf pain.  She is on a 81 mg aspirin. Denies any changes with her breathing.  She is here today for evaluation and for recommendations regarding her findings.     MEDICAL HISTORY: Past Medical History:  Diagnosis Date   Arthritis    knee, hands    CAD S/P PCI OM1 99%-0%; Plan Staged PCI mRCA 90%. 02/19/2017   1. Severe 2 vessel obstructive CAD    - 99% thrombotic occlusion of first OM. This is a bifurcating vessel.     - 90% mid RCA-segmental 2. Good overall LV dysfunction with lateral HK 3. Moderately elevated LVEDP 4. Successful stenting of the first OM with DES. Distal embolization into the distal lateral OM branch.   Plan: DAPT for one year. Will treat with IV Aggrastat for 18 hours. Start high dose statin, beta blocker. IV Ntg for BP control acutely. Plan for stage PCI of RCA on Monday 32/20 if no complication.   Essential hypertension 02/21/2017   Hyperlipidemia with target LDL less than 70 02/21/2017   Lung cancer ([place])    Presence of drug-eluting stent in L Cx: OM1 (Xience SIERRA DES 3.5 x 15) 02/21/2017   STEMI involving left circumflex coronary artery ([place]) 02/19/2017   Tobacco abuse 02/19/2017    ALLERGIES:  is allergic to codeine.  MEDICATIONS:  Current Outpatient Medications  Medication Sig Dispense Refill   cephALEXin (KEFLEX) 500 MG capsule Take 1 capsule (500 mg total) by mouth 4 (four) times daily. 20 capsule 0   acetaminophen (TYLENOL) 500 MG tablet Take 500 mg by mouth as needed for moderate pain.     aspirin 81 MG chewable tablet Chew 1 tablet (81 mg total) by mouth daily.  atorvastatin (LIPITOR) 80 MG tablet TAKE 1 TABLET BY MOUTH DAILY AT 6 PM. (Patient taking differently: Take 80 mg by mouth daily.) 90 tablet 3   Coenzyme Q10 (CO Q 10) 100 MG CAPS Take 100 mg by mouth every evening.     diclofenac Sodium (VOLTAREN) 1 % GEL Apply 1 Application topically daily as needed (Knee pain).     ezetimibe (ZETIA) 10 MG tablet TAKE 1 TABLET BY MOUTH EVERY DAY 90 tablet 0   folic acid (FOLVITE) 1 MG tablet Take 1 tablet (1 mg total) by mouth daily. 90 tablet 1   irbesartan (AVAPRO) 300 MG tablet TAKE 1 TABLET BY MOUTH DAILY. PLEASE CONTACT THE OFFICE TO SCHEDULE APPOINTMENT 90 tablet 0   levETIRAcetam (KEPPRA) 500 MG  tablet Take 1 tablet (500 mg total) by mouth 2 (two) times daily. 60 tablet 2   metoprolol tartrate (LOPRESSOR) 25 MG tablet TAKE 1 TABLET BY MOUTH TWICE A DAY 180 tablet 0   nitroGLYCERIN (NITROSTAT) 0.4 MG SL tablet Place 1 tablet (0.4 mg total) under the tongue every 5 (five) minutes x 3 doses as needed for chest pain. 25 tablet 2   ondansetron (ZOFRAN) 8 MG tablet Take 1 tablet (8 mg total) by mouth every 8 (eight) hours as needed for nausea or vomiting. Starting day 3 after chemotherapy (Patient taking differently: Take 8 mg by mouth as needed for nausea or vomiting.) 30 tablet 2   pantoprazole (PROTONIX) 40 MG tablet TAKE 1 TABLET BY MOUTH EVERY DAY 90 tablet 1   prochlorperazine (COMPAZINE) 10 MG tablet Take 1 tablet (10 mg total) by mouth every 6 (six) hours as needed for nausea or vomiting. (Patient taking differently: Take 10 mg by mouth as needed for nausea or vomiting.) 30 tablet 2   traMADol (ULTRAM) 50 MG tablet Take 50 mg by mouth daily as needed for moderate pain.     No current facility-administered medications for this visit.    SURGICAL HISTORY:  Past Surgical History:  Procedure Laterality Date   BRONCHIAL BIOPSY  01/15/2022   Procedure: BRONCHIAL BIOPSIES;  Surgeon: [name], MD;  Location: [facility] ENDOSCOPY;  Service: Pulmonary;;   BRONCHIAL BRUSHINGS  01/15/2022   Procedure: BRONCHIAL BRUSHINGS;  Surgeon: [name], MD;  Location: [facility] ENDOSCOPY;  Service: Pulmonary;;   BRONCHIAL NEEDLE ASPIRATION BIOPSY  01/15/2022   Procedure: BRONCHIAL NEEDLE ASPIRATION BIOPSIES;  Surgeon: [name], MD;  Location: [facility] ENDOSCOPY;  Service: Pulmonary;;   CARDIAC CATHETERIZATION  02/21/2017   CHOLECYSTECTOMY     CORONARY STENT INTERVENTION N/A 02/19/2017   Procedure: CORONARY STENT INTERVENTION;  Surgeon: [name], Peter M, MD;  Location: [place] CV LAB;  Service: Cardiovascular;  Laterality: N/A;   CORONARY STENT INTERVENTION N/A 02/21/2017   Procedure: CORONARY  STENT INTERVENTION;  Surgeon: Belva Crome, MD;  Location: [place] CV LAB;  Service: Cardiovascular;  Laterality: N/A;   FIDUCIAL MARKER PLACEMENT  01/15/2022   Procedure: FIDUCIAL MARKER PLACEMENT;  Surgeon: [name], MD;  Location: [place];  Service: Pulmonary;;   FINE NEEDLE ASPIRATION  01/15/2022   Procedure: FINE NEEDLE ASPIRATION;  Surgeon: [name], MD;  Location: [place];  Service: Pulmonary;;   HEMOSTASIS CONTROL  01/15/2022   Procedure: HEMOSTASIS CONTROL;  Surgeon: [name], MD;  Location: [facility] ENDOSCOPY;  Service: Pulmonary;;   KNEE ARTHROSCOPY Right 2010   KNEE ARTHROSCOPY WITH SUBCHONDROPLASTY Left 01/09/2020   Procedure: Left knee arthroscopy,partial medial meniscectomy, debridement, subchondroplasty left proximal tibia;  Surgeon: Susa Day, MD;  Location: [name];  Service: Orthopedics;  Laterality: Left;   LEFT HEART CATH AND CORONARY ANGIOGRAPHY N/A 02/19/2017   Procedure: LEFT HEART CATH AND CORONARY ANGIOGRAPHY;  Surgeon: [name], Peter M, MD;  Location: [place] CV LAB;  Service: Cardiovascular;  Laterality: N/A;   TONSILLECTOMY     TUBAL LIGATION     VIDEO BRONCHOSCOPY WITH ENDOBRONCHIAL ULTRASOUND N/A 01/15/2022   Procedure: VIDEO BRONCHOSCOPY WITH ENDOBRONCHIAL ULTRASOUND;  Surgeon: [name], MD;  Location: [facility] ENDOSCOPY;  Service: Pulmonary;  Laterality: N/A;   VIDEO BRONCHOSCOPY WITH RADIAL ENDOBRONCHIAL ULTRASOUND  01/15/2022   Procedure: RADIAL ENDOBRONCHIAL ULTRASOUND;  Surgeon: [name], MD;  Location: [facility] ENDOSCOPY;  Service: Pulmonary;;    REVIEW OF SYSTEMS:   Review of Systems  Constitutional: Negative for appetite change, chills, fatigue, fever and unexpected weight change.  HENT: Negative for mouth sores, nosebleeds, sore throat and trouble swallowing.   Eyes: Negative for eye problems and icterus.  Respiratory: Negative for cough, hemoptysis, shortness of breath and wheezing.   Cardiovascular:  Negative for chest pain. Positive for bilateral foot swelling/ankle.  Gastrointestinal: Negative for abdominal pain, constipation, diarrhea, nausea and vomiting.  Genitourinary: Negative for bladder incontinence, difficulty urinating, dysuria, frequency and hematuria.   Musculoskeletal: Negative for back pain, gait problem, neck pain and neck stiffness.  Skin: Positive for bilateral ankle and foot swelling. Mild erythema over tops of feet bilaterally. Edema is non-pitting.  Neurological: Negative for dizziness, extremity weakness, gait problem, headaches, light-headedness and seizures.  Hematological: Negative for adenopathy. Does not bruise/bleed easily.  Psychiatric/Behavioral: Negative for confusion, depression and sleep disturbance. The patient is not nervous/anxious.     PHYSICAL EXAMINATION:  There were no vitals taken for this visit.  ECOG PERFORMANCE STATUS: 1-2  Physical Exam  Constitutional: Oriented to person, place, and time and well-developed, well-nourished, and in no distress.  HENT:  Head: Normocephalic and atraumatic.  Mouth/Throat: Oropharynx is clear and moist. No oropharyngeal exudate.  Eyes: Conjunctivae are normal. Right eye exhibits no discharge. Left eye exhibits no discharge. No scleral icterus.  Neck: Normal range of motion. Neck supple.  Cardiovascular: Normal rate, regular rhythm, normal heart sounds and intact distal pulses.   Pulmonary/Chest: Effort normal and breath sounds normal. No respiratory distress. No wheezes. No rales.  Abdominal: Soft. Bowel sounds are normal. Exhibits no distension and no mass. There is no tenderness.  Musculoskeletal: Normal range of motion. Positive for bilateral lower extremity swelling. Non pitting. No calf pain. Negative Hoomans sign.  Lymphadenopathy:    No cervical adenopathy.  Neurological: Alert and oriented to person, place, and time. Exhibits normal muscle tone. Examined in the wheelchair.  Skin: Skin is warm and dry.  Dry flaky skin on legs bilaterally. Mild erythema over tops of feet bilaterally. No rash noted. Not diaphoretic. No erythema. No pallor.  Psychiatric: Mood, memory and judgment normal.  Vitals reviewed.  LABORATORY DATA: Lab Results  Component Value Date   WBC 4.6 05/24/2022   HGB 10.9 (L) 05/24/2022   HCT 31.2 (L) 05/24/2022   MCV 92.9 05/24/2022   PLT 247 05/24/2022      Chemistry      Component Value Date/Time   NA 138 05/24/2022 0958   NA 143 12/08/2020 1620   K 3.7 05/24/2022 0958   CL 106 05/24/2022 0958   CO2 25 05/24/2022 0958   BUN 10 05/24/2022 0958   BUN 11 12/08/2020 1620   CREATININE 0.86 05/24/2022 0958  Component Value Date/Time   CALCIUM 9.0 05/24/2022 0958   ALKPHOS 73 05/24/2022 0958   AST 22 05/24/2022 0958   ALT 24 05/24/2022 0958   BILITOT 0.4 05/24/2022 0958       RADIOGRAPHIC STUDIES:  CT Chest W Contrast  Result Date: 05/13/2022 CLINICAL DATA:  Non-small-cell lung cancer staging. Adenocarcinoma. * Tracking Code: BO * EXAM: CT CHEST, ABDOMEN, AND PELVIS WITH CONTRAST TECHNIQUE: Multidetector CT imaging of the chest, abdomen and pelvis was performed following the standard protocol during bolus administration of intravenous contrast. RADIATION DOSE REDUCTION: This exam was performed according to the departmental dose-optimization program which includes automated exposure control, adjustment of the mA and/or kV according to patient size and/or use of iterative reconstruction technique. CONTRAST:  114mL OMNIPAQUE IOHEXOL 300 MG/ML  SOLN COMPARISON:  PET-CT 01/08/2022. Old CT scan chest 01/08/2022 and older FINDINGS: CT CHEST FINDINGS Cardiovascular: Heart nonenlarged. Coronary artery calcifications are identified. No significant pericardial effusion. Normal caliber thoracic aorta. Scattered vascular calcifications. Some noncalcified irregular plaque as well. Mediastinum/Nodes: There is no specific abnormal lymph node enlargement seen in the axillary  region, hilum and mediastinum. There are some calcified lymph nodes in the mediastinum, not pathologic by size criteria. Previously there was a left hilar node which is hypermetabolic measuring 12 mm in short axis. Small node in this location today on series 2 image 25 measures 9 by 7 mm. The previous hypermetabolic mediastinal node left paratracheal today essentially absent. Only minimal soft tissue thickening identified in this location on series 2, image 21. Slightly patulous thoracic esophagus. Small hiatal hernia. Lungs/Pleura: There is some atelectasis scarring identified along the middle lobe. No consolidation, pneumothorax or effusion. 3 mm nodule identified in the lingula on image 79 of series 4 stable. The more masslike area in the posterior left upper lobe abutting the interlobar fissure which was hypermetabolic on the PET-CT scan is again seen. Previously this measured 2.9 x 2.7 cm. Today on series 4, image 59 2.7 by 1.5 cm. There is an adjacent slightly medially inferior fiduciary marker. No new dominant lung nodule. Musculoskeletal: Slight curvature of the spine with some degenerative change. Presumed areas of spinal hemangiomas along vertebral bodies particularly at T12. If there is concern of osseous metastatic disease, bone scan can be performed as clinically indicated. CT ABDOMEN PELVIS FINDINGS Hepatobiliary: Diffuse fatty liver infiltration identified. No definite space-occupying liver lesion. Patent portal vein. Previous cholecystectomy. Some fatty sparing identified dependently in the left lobe lateral segment. Pancreas: Preserved pancreatic parenchyma. No ductal dilatation or obvious mass. Spleen: Splenic granulomas are seen.  The spleen is nonenlarged. Adrenals/Urinary Tract: As seen previously there are bilateral adrenal nodules which were felt to be adenomas and are unchanged from previous examination. Left-sided focus today once again has dimensions proximally 3.4 by 2.0 cm and right 2.3  by 1.6 cm. Again similar when adjusted for measurement variation. Mild bilateral renal atrophy. Large central Bosniak 1 bone left-sided renal cysts identified. Small right-sided cysts. No specific imaging follow-up of the spine dynamic 1 lesions. There is once again a nonobstructing lower pole stone. No collecting system dilatation. Preserved contours of the urinary bladder. Stomach/Bowel: The stomach and small bowel are nondilated. No free air or free fluid. Proximal duodenal diverticula identified. Normal appendix. Vascular/Lymphatic: Normal caliber aorta and IVC with scattered atherosclerotic changes. There is a small area of slight ectasia along the abdominal aorta which is saccular left at the origin of the left renal artery. Diameter at this location of the renal artery origin  is 11 mm on coronal image 63. Mild associated plaque. There is scattered calcified plaque along the iliac vessels as well. Reproductive: Uterus and bilateral adnexa are unremarkable. Other: No abdominal wall hernia or abnormality. No abdominopelvic ascites. Musculoskeletal: Scattered degenerative changes seen of the spine and pelvis. IMPRESSION: Significant interval improvement in the left hilar and adjacent mediastinal lymph nodes. The primary lesion in the left lung is slightly decreased in size as well. No new mass lesion, fluid collection or lymph node enlargement. Fatty liver infiltration. Nonobstructing left-sided renal stone. Benign bilateral renal cysts. Adrenal adenomas. Focal saccular dilatation of the origin of the left renal artery measuring up to 11 mm. Electronically Signed   By: Jill Side M.D.   On: 05/13/2022 12:59   CT Abdomen Pelvis W Contrast  Result Date: 05/13/2022 CLINICAL DATA:  Non-small-cell lung cancer staging. Adenocarcinoma. * Tracking Code: BO * EXAM: CT CHEST, ABDOMEN, AND PELVIS WITH CONTRAST TECHNIQUE: Multidetector CT imaging of the chest, abdomen and pelvis was performed following the standard  protocol during bolus administration of intravenous contrast. RADIATION DOSE REDUCTION: This exam was performed according to the departmental dose-optimization program which includes automated exposure control, adjustment of the mA and/or kV according to patient size and/or use of iterative reconstruction technique. CONTRAST:  12mL OMNIPAQUE IOHEXOL 300 MG/ML  SOLN COMPARISON:  PET-CT 01/08/2022. Old CT scan chest 01/08/2022 and older FINDINGS: CT CHEST FINDINGS Cardiovascular: Heart nonenlarged. Coronary artery calcifications are identified. No significant pericardial effusion. Normal caliber thoracic aorta. Scattered vascular calcifications. Some noncalcified irregular plaque as well. Mediastinum/Nodes: There is no specific abnormal lymph node enlargement seen in the axillary region, hilum and mediastinum. There are some calcified lymph nodes in the mediastinum, not pathologic by size criteria. Previously there was a left hilar node which is hypermetabolic measuring 12 mm in short axis. Small node in this location today on series 2 image 25 measures 9 by 7 mm. The previous hypermetabolic mediastinal node left paratracheal today essentially absent. Only minimal soft tissue thickening identified in this location on series 2, image 21. Slightly patulous thoracic esophagus. Small hiatal hernia. Lungs/Pleura: There is some atelectasis scarring identified along the middle lobe. No consolidation, pneumothorax or effusion. 3 mm nodule identified in the lingula on image 79 of series 4 stable. The more masslike area in the posterior left upper lobe abutting the interlobar fissure which was hypermetabolic on the PET-CT scan is again seen. Previously this measured 2.9 x 2.7 cm. Today on series 4, image 59 2.7 by 1.5 cm. There is an adjacent slightly medially inferior fiduciary marker. No new dominant lung nodule. Musculoskeletal: Slight curvature of the spine with some degenerative change. Presumed areas of spinal  hemangiomas along vertebral bodies particularly at T12. If there is concern of osseous metastatic disease, bone scan can be performed as clinically indicated. CT ABDOMEN PELVIS FINDINGS Hepatobiliary: Diffuse fatty liver infiltration identified. No definite space-occupying liver lesion. Patent portal vein. Previous cholecystectomy. Some fatty sparing identified dependently in the left lobe lateral segment. Pancreas: Preserved pancreatic parenchyma. No ductal dilatation or obvious mass. Spleen: Splenic granulomas are seen.  The spleen is nonenlarged. Adrenals/Urinary Tract: As seen previously there are bilateral adrenal nodules which were felt to be adenomas and are unchanged from previous examination. Left-sided focus today once again has dimensions proximally 3.4 by 2.0 cm and right 2.3 by 1.6 cm. Again similar when adjusted for measurement variation. Mild bilateral renal atrophy. Large central Bosniak 1 bone left-sided renal cysts identified. Small right-sided cysts. No specific imaging  follow-up of the spine dynamic 1 lesions. There is once again a nonobstructing lower pole stone. No collecting system dilatation. Preserved contours of the urinary bladder. Stomach/Bowel: The stomach and small bowel are nondilated. No free air or free fluid. Proximal duodenal diverticula identified. Normal appendix. Vascular/Lymphatic: Normal caliber aorta and IVC with scattered atherosclerotic changes. There is a small area of slight ectasia along the abdominal aorta which is saccular left at the origin of the left renal artery. Diameter at this location of the renal artery origin is 11 mm on coronal image 63. Mild associated plaque. There is scattered calcified plaque along the iliac vessels as well. Reproductive: Uterus and bilateral adnexa are unremarkable. Other: No abdominal wall hernia or abnormality. No abdominopelvic ascites. Musculoskeletal: Scattered degenerative changes seen of the spine and pelvis. IMPRESSION:  Significant interval improvement in the left hilar and adjacent mediastinal lymph nodes. The primary lesion in the left lung is slightly decreased in size as well. No new mass lesion, fluid collection or lymph node enlargement. Fatty liver infiltration. Nonobstructing left-sided renal stone. Benign bilateral renal cysts. Adrenal adenomas. Focal saccular dilatation of the origin of the left renal artery measuring up to 11 mm. Electronically Signed   By: Jill Side M.D.   On: 05/13/2022 12:59     ASSESSMENT/PLAN:  This is a very pleasant 71 year old Caucasian female diagnosed with stage IV (T1c, N2, M1 C) non-small cell lung cancer, adenocarcinoma.  The patient presented with a left upper lobe lung mass in addition to left hilar mediastinal lymphadenopathy as well as innumerable brain metastases.  She was diagnosed in September 2023.   She is positive for K-ras G12 C mutation which can be targeted in the second line setting. Her PDL1 expression is 93%.   She underwent SRS to the multiple brain lesions on 02/24/2022.   She is currently undergoing palliative systemic chemotherapy with carboplatin for AUC of 5, Alimta 500 mg per metered square, Keytruda 200 mg IV every 3 weeks.  She is status post 5 cycles and tolerated it well except for controlled nausea/vomiting. Starting from cycle #5 she started maintenance alimta and [place].   The patient was seen today for an acute visit for leg swelling for about 1 week.   After discussion with the patient, we will plan for a Doppler ultrasound of the lower extremities to rule out blood clot given her history of malignancy which increases the risk of hypercoagulable state/clotting.  Discussed that a Doppler ultrasound can also assess for her history of a Baker's cyst.  If this is negative, then I recommended the patient use compression stockings, elevate her lower extremities, increase her protein intake, and decrease her intake of high salt foods.  He does  have some erythema bilaterally over the tops of her feet without any associated warmth, pain, or any drainage.  I will send her prescription for Keflex 4 times daily for possible overlying skin infection/cellulitis.  The patient denies any allergies to any antibiotics.  Unfortunately, the lab is closed at this time of the day.  If worsening symptoms, I would like to bring the patient back to the clinic for repeat labs to check kidney function and protein.  I would like to repeat her CMP prior to considering any Lasix.   If the patient's symptoms do not improve or worsen, I asked that she reach out to me via telephone call or MyChart message later this week for a status update so we can discuss next steps.   We  discussed warning symptoms that would require immediate evaluation including systemic symptoms such as fevers or chills.  Discussed if she develops any calf or foot pain, increased erythema, warmth, drainage, respiratory symptoms such as chest pain, or shortness of breath that she would need to be evaluated emergently.  The patient was advised to call immediately if she has any concerning symptoms in the interval. The patient voices understanding of current disease status and treatment options and is in agreement with the current care plan. All questions were answered. The patient knows to call the clinic with any problems, questions or concerns. We can certainly see the patient much sooner if necessary  No orders of the defined types were placed in this encounter.     The total time spent in the appointment was 30-39 minutes.  Abigale Dorow L Marthella Osorno, PA-C 06/01/22

## 2022-06-02 ENCOUNTER — Telehealth: Payer: Self-pay

## 2022-06-02 ENCOUNTER — Ambulatory Visit (HOSPITAL_COMMUNITY)
Admission: RE | Admit: 2022-06-02 | Discharge: 2022-06-02 | Disposition: A | Discharge status: Home / Self Care | Payer: Medicare Other | Source: Ambulatory Visit | Attending: Physician Assistant | Admitting: Physician Assistant

## 2022-06-02 DIAGNOSIS — M7989 Other specified soft tissue disorders: Secondary | ICD-10-CM | POA: Diagnosis not present

## 2022-06-02 NOTE — Telephone Encounter (Signed)
This nurse attempted to reach patient about doppler results and provider recommendations.  No answer.  This nurse will send My Chart message for patient.

## 2022-06-09 ENCOUNTER — Ambulatory Visit
Admission: RE | Admit: 2022-06-09 | Discharge: 2022-06-09 | Disposition: A | Discharge status: Home / Self Care | Payer: Medicare Other | Source: Ambulatory Visit | Attending: Radiation Oncology | Admitting: Radiation Oncology

## 2022-06-09 DIAGNOSIS — C7931 Secondary malignant neoplasm of brain: Secondary | ICD-10-CM

## 2022-06-09 MED ORDER — GADOPICLENOL 0.5 MMOL/ML IV SOLN
9.0000 mL | Freq: Once | INTRAVENOUS | Status: AC | PRN
  Administered 2022-06-09: 9 mL via INTRAVENOUS

## 2022-06-10 ENCOUNTER — Encounter: Payer: Self-pay | Admitting: Genetic Counselor

## 2022-06-14 ENCOUNTER — Other Ambulatory Visit: Payer: Self-pay

## 2022-06-14 ENCOUNTER — Ambulatory Visit: Payer: Medicare Other

## 2022-06-14 ENCOUNTER — Ambulatory Visit: Payer: Medicare Other | Admitting: Internal Medicine

## 2022-06-14 ENCOUNTER — Other Ambulatory Visit: Payer: Medicare Other

## 2022-06-14 ENCOUNTER — Inpatient Hospital Stay: Payer: Medicare Other | Attending: Radiation Oncology

## 2022-06-14 ENCOUNTER — Other Ambulatory Visit: Payer: Self-pay | Admitting: Physician Assistant

## 2022-06-14 ENCOUNTER — Inpatient Hospital Stay: Payer: Medicare Other

## 2022-06-14 ENCOUNTER — Inpatient Hospital Stay (HOSPITAL_BASED_OUTPATIENT_CLINIC_OR_DEPARTMENT_OTHER): Payer: Medicare Other | Admitting: Internal Medicine

## 2022-06-14 DIAGNOSIS — C3492 Malignant neoplasm of unspecified part of left bronchus or lung: Secondary | ICD-10-CM | POA: Diagnosis not present

## 2022-06-14 DIAGNOSIS — Z5111 Encounter for antineoplastic chemotherapy: Secondary | ICD-10-CM | POA: Insufficient documentation

## 2022-06-14 DIAGNOSIS — C3412 Malignant neoplasm of upper lobe, left bronchus or lung: Secondary | ICD-10-CM | POA: Insufficient documentation

## 2022-06-14 DIAGNOSIS — Z5112 Encounter for antineoplastic immunotherapy: Secondary | ICD-10-CM | POA: Insufficient documentation

## 2022-06-14 DIAGNOSIS — C7931 Secondary malignant neoplasm of brain: Secondary | ICD-10-CM | POA: Insufficient documentation

## 2022-06-14 DIAGNOSIS — Z79899 Other long term (current) drug therapy: Secondary | ICD-10-CM | POA: Diagnosis not present

## 2022-06-14 LAB — CMP (CANCER CENTER ONLY)
ALT: 24 U/L (ref 0–44)
AST: 29 U/L (ref 15–41)
Albumin: 3.7 g/dL (ref 3.5–5.0)
Alkaline Phosphatase: 76 U/L (ref 38–126)
Anion gap: 10 (ref 5–15)
BUN: 9 mg/dL (ref 8–23)
CO2: 23 mmol/L (ref 22–32)
Calcium: 9.1 mg/dL (ref 8.9–10.3)
Chloride: 104 mmol/L (ref 98–111)
Creatinine: 0.75 mg/dL (ref 0.44–1.00)
GFR, Estimated: >60 mL/min (ref >60)
Glucose, Bld: 128 mg/dL — ABNORMAL HIGH (ref 70–99)
Potassium: 3.8 mmol/L (ref 3.5–5.1)
Sodium: 137 mmol/L (ref 135–145)
Total Bilirubin: 0.5 mg/dL (ref 0.3–1.2)
Total Protein: 6.9 g/dL (ref 6.5–8.1)

## 2022-06-14 LAB — CBC WITH DIFFERENTIAL (CANCER CENTER ONLY)
Abs Immature Granulocytes: 0.02 10*3/uL (ref 0.00–0.07)
Basophils Absolute: 0 10*3/uL (ref 0.0–0.1)
Basophils Relative: 1 %
Eosinophils Absolute: 0.1 10*3/uL (ref 0.0–0.5)
Eosinophils Relative: 2 %
HCT: 31 % — ABNORMAL LOW (ref 36.0–46.0)
Hemoglobin: 10.6 g/dL — ABNORMAL LOW (ref 12.0–15.0)
Immature Granulocytes: 0 %
Lymphocytes Relative: 27 %
Lymphs Abs: 1.6 10*3/uL (ref 0.7–4.0)
MCH: 33 pg (ref 26.0–34.0)
MCHC: 34.2 g/dL (ref 30.0–36.0)
MCV: 96.6 fL (ref 80.0–100.0)
Monocytes Absolute: 0.5 10*3/uL (ref 0.1–1.0)
Monocytes Relative: 8 %
Neutro Abs: 3.6 10*3/uL (ref 1.7–7.7)
Neutrophils Relative %: 62 %
Platelet Count: 314 10*3/uL (ref 150–400)
RBC: 3.21 MIL/uL — ABNORMAL LOW (ref 3.87–5.11)
RDW: 17.7 % — ABNORMAL HIGH (ref 11.5–15.5)
WBC Count: 5.8 10*3/uL (ref 4.0–10.5)
nRBC: 0 % (ref 0.0–0.2)

## 2022-06-14 LAB — TSH: TSH: 1.568 u[IU]/mL (ref 0.350–4.500)

## 2022-06-14 MED ORDER — SODIUM CHLORIDE 0.9 % IV SOLN: Freq: Once | INTRAVENOUS | Status: AC

## 2022-06-14 MED ORDER — SODIUM CHLORIDE 0.9 % IV SOLN
200.0000 mg | Freq: Once | INTRAVENOUS | Status: AC
  Administered 2022-06-14: 200 mg via INTRAVENOUS
  Filled 2022-06-14: qty 8

## 2022-06-14 MED ORDER — CYANOCOBALAMIN 1000 MCG/ML IJ SOLN
1000.0000 ug | Freq: Once | INTRAMUSCULAR | Status: AC
  Administered 2022-06-14: 1000 ug via INTRAMUSCULAR
  Filled 2022-06-14: qty 1

## 2022-06-14 MED ORDER — FUROSEMIDE 20 MG PO TABS: ORAL_TABLET | ORAL | 0 refills | Status: DC

## 2022-06-14 MED ORDER — PROCHLORPERAZINE MALEATE 10 MG PO TABS
10.0000 mg | ORAL_TABLET | Freq: Once | ORAL | Status: DC
  Filled 2022-06-14: qty 1

## 2022-06-14 MED ORDER — SODIUM CHLORIDE 0.9 % IV SOLN
500.0000 mg/m2 | Freq: Once | INTRAVENOUS | Status: AC
  Administered 2022-06-14: 1000 mg via INTRAVENOUS
  Filled 2022-06-14: qty 40

## 2022-06-14 MED ORDER — PROCHLORPERAZINE MALEATE 10 MG PO TABS: 10.0000 mg | ORAL_TABLET | Freq: Four times a day (QID) | ORAL | 2 refills | Status: DC | PRN

## 2022-06-14 NOTE — Progress Notes (Signed)
Pt informed this RN that she took compazine prior to her appts today at 0930.

## 2022-06-14 NOTE — Patient Instructions (Signed)
Kimberly Huang  Discharge Instructions: Thank you for choosing [facility] to provide your oncology and hematology care.   If you have a lab appointment with the [facility], please go directly to the [facility] and check in at the registration area.   Wear comfortable clothing and clothing appropriate for easy access to any Portacath or PICC line.   We strive to give you quality time with your provider. You may need to reschedule your appointment if you arrive late (15 or more minutes).  Arriving late affects you and other patients whose appointments are after yours.  Also, if you miss three or more appointments without notifying the office, you may be dismissed from the clinic at the provider's discretion.      For prescription refill requests, have your pharmacy contact our office and allow 72 hours for refills to be completed.    Today you received the following chemotherapy and/or immunotherapy agents Keytruda and Alimta.      To help prevent nausea and vomiting after your treatment, we encourage you to take your nausea medication as directed.  BELOW ARE SYMPTOMS THAT SHOULD BE REPORTED IMMEDIATELY: *FEVER GREATER THAN 100.4 F (38 C) OR HIGHER *CHILLS OR SWEATING *NAUSEA AND VOMITING THAT IS NOT CONTROLLED WITH YOUR NAUSEA MEDICATION *UNUSUAL SHORTNESS OF BREATH *UNUSUAL BRUISING OR BLEEDING *URINARY PROBLEMS (pain or burning when urinating, or frequent urination) *BOWEL PROBLEMS (unusual diarrhea, constipation, pain near the anus) TENDERNESS IN MOUTH AND THROAT WITH OR WITHOUT PRESENCE OF ULCERS (sore throat, sores in mouth, or a toothache) UNUSUAL RASH, SWELLING OR PAIN  UNUSUAL VAGINAL DISCHARGE OR ITCHING   Items with * indicate a potential emergency and should be followed up as soon as possible or go to the Emergency Department if any problems should occur.  Please show the CHEMOTHERAPY ALERT CARD or IMMUNOTHERAPY ALERT  CARD at check-in to the Emergency Department and triage nurse.  Should you have questions after your visit or need to cancel or reschedule your appointment, please contact [facility]  Dept: [phone]  and follow the prompts.  Office hours are 8:00 a.m. to 4:30 p.m. Monday - Friday. Please note that voicemails left after 4:00 p.m. may not be returned until the following business day.  We are closed weekends and major holidays. You have access to a nurse at all times for urgent questions. Please call the main number to the clinic Dept: [phone] and follow the prompts.   For any non-urgent questions, you may also contact your provider using MyChart. We now offer e-Visits for anyone 53 and older to request care online for non-urgent symptoms. For details visit [email or web address].   Also download the MyChart app! Go to the app store, search "MyChart", open the app, select [facility], and log in with your MyChart username and password.

## 2022-06-14 NOTE — Progress Notes (Signed)
[facility] [phone]   [phone]  OFFICE PROGRESS NOTE  Patient, No Pcp Per No address on file  DIAGNOSIS: Stage IV (T1c, N2, M1 C) non-small cell lung cancer, adenocarcinoma with positive KRAS G12C mutation diagnosed in September 2023 and presented with left upper lobe lung mass in addition to left hilar and mediastinal lymphadenopathy as well as innumerable brain metastasis.  PRIOR THERAPY: SRS to multiple brain metastasis under the care of Dr. [name] on February 24, 2022.  CURRENT THERAPY: Palliative systemic chemotherapy with carboplatin for AUC of 5, Alimta 500 Mg/M2 and Keytruda 200 Mg IV every 3 weeks.  Status post 5 cycles.  Starting from cycle #5 the patient will be on maintenance treatment with Alimta and Keytruda every 3 weeks.  INTERVAL HISTORY: Kimberly Huang 72 y.o. female returns to the clinic today for follow-up visit accompanied by her daughter-in-law Kimberly Huang.  The patient is feeling fine today with no concerning complaints except for mild swelling of the lower extremities as well as generalized fatigue.  She also has occasional dizzy spells.  She denied having any current chest pain but has shortness of breath with exertion with no cough or hemoptysis.  She has occasional nausea but no vomiting, diarrhea or constipation.  She has no headache or visual changes.  She has no recent weight loss or night sweats.  She is here today for evaluation before starting cycle #6 of her treatment.  MEDICAL HISTORY: Past Medical History:  Diagnosis Date   Arthritis    knee, hands   CAD S/P PCI OM1 99%-0%; Plan Staged PCI mRCA 90%. 02/19/2017   1. Severe 2 vessel obstructive CAD    - 99% thrombotic occlusion of first OM. This is a bifurcating vessel.     - 90% mid RCA-segmental 2. Good overall LV dysfunction with lateral HK 3. Moderately elevated LVEDP 4. Successful stenting of the first OM with DES. Distal embolization into the distal lateral  OM branch.   Plan: DAPT for one year. Will treat with IV Aggrastat for 18 hours. Start high dose statin, beta blocker. IV Ntg for BP control acutely. Plan for stage PCI of RCA on Monday 65/99 if no complication.   Essential hypertension 02/21/2017   Hyperlipidemia with target LDL less than 70 02/21/2017   Lung cancer ([facility])    Presence of drug-eluting stent in L Cx: OM1 (Xience SIERRA DES 3.5 x 15) 02/21/2017   STEMI involving left circumflex coronary artery ([place]) 02/19/2017   Tobacco abuse 02/19/2017    ALLERGIES:  is allergic to codeine.  MEDICATIONS:  Current Outpatient Medications  Medication Sig Dispense Refill   acetaminophen (TYLENOL) 500 MG tablet Take 500 mg by mouth as needed for moderate pain.     aspirin 81 MG chewable tablet Chew 1 tablet (81 mg total) by mouth daily.     atorvastatin (LIPITOR) 80 MG tablet TAKE 1 TABLET BY MOUTH DAILY AT 6 PM. (Patient taking differently: Take 80 mg by mouth daily.) 90 tablet 3   cephALEXin (KEFLEX) 500 MG capsule Take 1 capsule (500 mg total) by mouth 4 (four) times daily. 20 capsule 0   Coenzyme Q10 (CO Q 10) 100 MG CAPS Take 100 mg by mouth every evening.     diclofenac Sodium (VOLTAREN) 1 % GEL Apply 1 Application topically daily as needed (Knee pain).     ezetimibe (ZETIA) 10 MG tablet TAKE 1 TABLET BY MOUTH EVERY DAY 90 tablet 0   folic  acid (FOLVITE) 1 MG tablet Take 1 tablet (1 mg total) by mouth daily. 90 tablet 1   irbesartan (AVAPRO) 300 MG tablet TAKE 1 TABLET BY MOUTH DAILY. PLEASE CONTACT THE OFFICE TO SCHEDULE APPOINTMENT 90 tablet 0   levETIRAcetam (KEPPRA) 500 MG tablet Take 1 tablet (500 mg total) by mouth 2 (two) times daily. 60 tablet 2   metoprolol tartrate (LOPRESSOR) 25 MG tablet TAKE 1 TABLET BY MOUTH TWICE A DAY 180 tablet 0   nitroGLYCERIN (NITROSTAT) 0.4 MG SL tablet Place 1 tablet (0.4 mg total) under the tongue every 5 (five) minutes x 3 doses as needed for chest pain. 25 tablet 2   ondansetron (ZOFRAN) 8 MG tablet  Take 1 tablet (8 mg total) by mouth every 8 (eight) hours as needed for nausea or vomiting. Starting day 3 after chemotherapy (Patient taking differently: Take 8 mg by mouth as needed for nausea or vomiting.) 30 tablet 2   pantoprazole (PROTONIX) 40 MG tablet TAKE 1 TABLET BY MOUTH EVERY DAY 90 tablet 1   prochlorperazine (COMPAZINE) 10 MG tablet TAKE 1 TABLET BY MOUTH EVERY 6 HOURS AS NEEDED FOR NAUSEA OR VOMITING. 30 tablet 2   traMADol (ULTRAM) 50 MG tablet Take 50 mg by mouth daily as needed for moderate pain.     No current facility-administered medications for this visit.    SURGICAL HISTORY:  Past Surgical History:  Procedure Laterality Date   BRONCHIAL BIOPSY  01/15/2022   Procedure: BRONCHIAL BIOPSIES;  Surgeon: [name], MD;  Location: [facility] ENDOSCOPY;  Service: Pulmonary;;   BRONCHIAL BRUSHINGS  01/15/2022   Procedure: BRONCHIAL BRUSHINGS;  Surgeon: [name], MD;  Location: [facility] ENDOSCOPY;  Service: Pulmonary;;   BRONCHIAL NEEDLE ASPIRATION BIOPSY  01/15/2022   Procedure: BRONCHIAL NEEDLE ASPIRATION BIOPSIES;  Surgeon: [name], MD;  Location: [facility] ENDOSCOPY;  Service: Pulmonary;;   CARDIAC CATHETERIZATION  02/21/2017   CHOLECYSTECTOMY     CORONARY STENT INTERVENTION N/A 02/19/2017   Procedure: CORONARY STENT INTERVENTION;  Surgeon: [name], Peter M, MD;  Location: [facility] CV LAB;  Service: Cardiovascular;  Laterality: N/A;   CORONARY STENT INTERVENTION N/A 02/21/2017   Procedure: CORONARY STENT INTERVENTION;  Surgeon: Belva Crome, MD;  Location: [facility] CV LAB;  Service: Cardiovascular;  Laterality: N/A;   FIDUCIAL MARKER PLACEMENT  01/15/2022   Procedure: FIDUCIAL MARKER PLACEMENT;  Surgeon: [name], MD;  Location: [facility];  Service: Pulmonary;;   FINE NEEDLE ASPIRATION  01/15/2022   Procedure: FINE NEEDLE ASPIRATION;  Surgeon: [name], MD;  Location: [facility];  Service: Pulmonary;;   HEMOSTASIS CONTROL  01/15/2022    Procedure: HEMOSTASIS CONTROL;  Surgeon: [name], MD;  Location: [facility] ENDOSCOPY;  Service: Pulmonary;;   KNEE ARTHROSCOPY Right 2010   KNEE ARTHROSCOPY WITH SUBCHONDROPLASTY Left 01/09/2020   Procedure: Left knee arthroscopy,partial medial meniscectomy, debridement, subchondroplasty left proximal tibia;  Surgeon: Susa Day, MD;  Location: [name];  Service: Orthopedics;  Laterality: Left;   LEFT HEART CATH AND CORONARY ANGIOGRAPHY N/A 02/19/2017   Procedure: LEFT HEART CATH AND CORONARY ANGIOGRAPHY;  Surgeon: [name], Peter M, MD;  Location: [place] CV LAB;  Service: Cardiovascular;  Laterality: N/A;   TONSILLECTOMY     TUBAL LIGATION     VIDEO BRONCHOSCOPY WITH ENDOBRONCHIAL ULTRASOUND N/A 01/15/2022   Procedure: VIDEO BRONCHOSCOPY WITH ENDOBRONCHIAL ULTRASOUND;  Surgeon: [name], MD;  Location: [facility] ENDOSCOPY;  Service: Pulmonary;  Laterality: N/A;   VIDEO BRONCHOSCOPY WITH RADIAL ENDOBRONCHIAL  ULTRASOUND  01/15/2022   Procedure: RADIAL ENDOBRONCHIAL ULTRASOUND;  Surgeon: [name], MD;  Location: [facility] ENDOSCOPY;  Service: Pulmonary;;    REVIEW OF SYSTEMS:  Constitutional: positive for fatigue Eyes: negative Ears, nose, mouth, throat, and face: negative Respiratory: positive for dyspnea on exertion Cardiovascular: negative Gastrointestinal: positive for nausea Genitourinary:negative Integument/breast: negative Hematologic/lymphatic: negative Musculoskeletal:positive for arthralgias Neurological: negative Behavioral/Psych: negative Endocrine: negative Allergic/Immunologic: negative   PHYSICAL EXAMINATION: General appearance: alert, cooperative, fatigued, and no distress Head: Normocephalic, without obvious abnormality, atraumatic Neck: no adenopathy, no JVD, supple, symmetrical, trachea midline, and thyroid not enlarged, symmetric, no tenderness/mass/nodules Lymph nodes: Cervical, supraclavicular, and axillary nodes normal. Resp: clear to auscultation  bilaterally Back: symmetric, no curvature. ROM normal. No CVA tenderness. Cardio: regular rate and rhythm, S1, S2 normal, no murmur, click, rub or gallop GI: soft, non-tender; bowel sounds normal; no masses,  no organomegaly Extremities: edema 1+ edema bilateral Neurologic: Alert and oriented X 3, normal strength and tone. Normal symmetric reflexes. Normal coordination and gait  ECOG PERFORMANCE STATUS: 1 - Symptomatic but completely ambulatory  Blood pressure 111/73, pulse 74, temperature 98.5 F (36.9 C), temperature source Oral, resp. rate 16, weight 189 lb (85.7 kg), SpO2 97 %.  LABORATORY DATA: Lab Results  Component Value Date   WBC 5.8 06/14/2022   HGB 10.6 (L) 06/14/2022   HCT 31.0 (L) 06/14/2022   MCV 96.6 06/14/2022   PLT 314 06/14/2022      Chemistry      Component Value Date/Time   NA 138 05/24/2022 0958   NA 143 12/08/2020 1620   K 3.7 05/24/2022 0958   CL 106 05/24/2022 0958   CO2 25 05/24/2022 0958   BUN 10 05/24/2022 0958   BUN 11 12/08/2020 1620   CREATININE 0.86 05/24/2022 0958      Component Value Date/Time   CALCIUM 9.0 05/24/2022 0958   ALKPHOS 73 05/24/2022 0958   AST 22 05/24/2022 0958   ALT 24 05/24/2022 0958   BILITOT 0.4 05/24/2022 0958       RADIOGRAPHIC STUDIES: MR Brain W Wo Contrast  Result Date: 06/11/2022 CLINICAL DATA:  Metastasis to brain (HCC) C79.31 (ICD-10-CM). Brain metastases, assess treatment response; 3T SRS Protocol. EXAM: MRI HEAD WITHOUT AND WITH CONTRAST TECHNIQUE: Multiplanar, multiecho pulse sequences of the brain and surrounding structures were obtained without and with intravenous contrast. COMPARISON:  MRI of the brain March 22, 2022. FINDINGS: Brain: Evidence of mixed treatment response with most lesions decreased in size or resolved and 2 lesion increased in size with increased surrounding vasogenic edema. These lesions are labeled on series 13 as following: Decreased lesions 1. Right cerebellar hemisphere, 2 mm (4  mm on prior), no surrounding edema, image 40. 2. 3. Left cerebellar hemisphere/vermis 11 mm (13 mm on prior) mild surrounding vasogenic edema, improved compared to prior. 4. 5. Right parietal lobe, 3 mm (4 mm on prior), no surrounding vasogenic edema, image 113. 6. 7. Left frontal lobe, 9 mm (11 mm on prior), with increased surrounding vasogenic edema, image 121. 8. 9. Right postcentral gyrus, 8 mm (9 mm on prior), increased surrounding vasogenic edema, image 121. 10. 11. Left frontal lobe, 2 mm (6 mm on prior), no surrounding vasogenic edema, image 132. 12. Left posteromedial frontal lobe lesion, 2 lesions in the inferior aspect of the right occipital lobe and inferior left cerebellar hemisphere lesion are no longer identified on postcontrast images with punctate foci of susceptibility artifact in their location on the susceptibility weighted images. Increased lesions  1. Right occipital lobe, 19 mm (16 mm on prior), with increased surrounding vasogenic edema, image 79. 2. 3. Left parietal lobe, 13 mm (10 mm on prior), with increased surrounding vasogenic edema, image 111. No new lesion identified. No acute infarct, hemorrhage, hydrocephalus or extra-axial collection. Vascular: Normal flow voids. Skull and upper cervical spine: Normal marrow signal. Sinuses/Orbits: Negative. Other: Incidentally noted small left temporomandibular joint effusion. IMPRESSION: 1. Evidence of mixed treatment response with most lesions decreased in size or resolved and 2 lesions increased in size with increased surrounding vasogenic edema. 2. No new lesion identified. Electronically Signed   By: Pedro Earls M.D.   On: 06/11/2022 13:35   VAS [place] LOWER EXTREMITY VENOUS (DVT)  Result Date: 06/02/2022  Lower Venous DVT Study Patient Name:  Kimberly Huang  Date of Exam:   06/02/2022 Medical Rec #: [id number]         Accession #:    [id number] Date of Birth: August 07, 1950          Patient Gender: F Patient Age:   53 years Exam  Location:  [facility] Procedure:      VAS [place] LOWER EXTREMITY VENOUS (DVT) Referring Phys: Vito Backers HEILINGOETTER --------------------------------------------------------------------------------  Indications: Swelling of feet.  Risk Factors: Chemotherapy. Limitations: Body habitus and poor ultrasound/tissue interface. Comparison Study: No previous exams Performing Technologist: Jody Hill RVT, RDMS  Examination Guidelines: A complete evaluation includes B-mode imaging, spectral Doppler, color Doppler, and power Doppler as needed of all accessible portions of each vessel. Bilateral testing is considered an integral part of a complete examination. Limited examinations for reoccurring indications may be performed as noted. The reflux portion of the exam is performed with the patient in reverse Trendelenburg.  +---------+---------------+---------+-----------+----------+-------------------+ RIGHT    CompressibilityPhasicitySpontaneityPropertiesThrombus Aging      +---------+---------------+---------+-----------+----------+-------------------+ CFV      Full           Yes      Yes                                      +---------+---------------+---------+-----------+----------+-------------------+ SFJ      Full                                                             +---------+---------------+---------+-----------+----------+-------------------+ FV Prox  Full           Yes      Yes                                      +---------+---------------+---------+-----------+----------+-------------------+ FV Mid   Full           Yes      Yes                                      +---------+---------------+---------+-----------+----------+-------------------+ FV DistalFull           Yes      Yes                                      +---------+---------------+---------+-----------+----------+-------------------+  PFV      Full                                                              +---------+---------------+---------+-----------+----------+-------------------+ POP      Full           Yes      Yes                                      +---------+---------------+---------+-----------+----------+-------------------+ PTV                                                   Not well visualized +---------+---------------+---------+-----------+----------+-------------------+ PERO                                                  Not well visualized +---------+---------------+---------+-----------+----------+-------------------+   +---------+---------------+---------+-----------+----------+--------------+ LEFT     CompressibilityPhasicitySpontaneityPropertiesThrombus Aging +---------+---------------+---------+-----------+----------+--------------+ CFV      Full           Yes      Yes                                 +---------+---------------+---------+-----------+----------+--------------+ SFJ      Full                                                        +---------+---------------+---------+-----------+----------+--------------+ FV Prox  Full           Yes      Yes                                 +---------+---------------+---------+-----------+----------+--------------+ FV Mid   Full           Yes      Yes                                 +---------+---------------+---------+-----------+----------+--------------+ FV DistalFull           Yes      Yes                                 +---------+---------------+---------+-----------+----------+--------------+ PFV      Full                                                        +---------+---------------+---------+-----------+----------+--------------+ POP  Full           Yes      Yes                                 +---------+---------------+---------+-----------+----------+--------------+ PTV      Full                                                         +---------+---------------+---------+-----------+----------+--------------+ PERO     Full                                                        +---------+---------------+---------+-----------+----------+--------------+     Summary: BILATERAL: - No evidence of deep vein thrombosis seen in the lower extremities, bilaterally. - RIGHT: - A complex cystic structure is found in the popliteal fossa (4.13 x 0.75 x 2.72 cm).  LEFT: - A large complex herterogenous cystic structure is found in the popliteal fossa (6.49 x 1.46 x 3.61 cm).  *See table(s) above for measurements and observations. Electronically signed by Harold Barban MD on 06/02/2022 at 7:11:39 PM.    Final     ASSESSMENT AND PLAN: This is a very pleasant 72 years old white female with Stage IV (T1c, N2, M1 C) non-small cell lung cancer, adenocarcinoma with positive KRAS G12C mutation diagnosed in September 2023 and presented with left upper lobe lung mass in addition to left hilar and mediastinal lymphadenopathy as well as innumerable brain metastasis. The patient is scheduled for SRS to multiple brain metastasis on February 24, 2022. The patient is currently undergoing systemic chemotherapy with carboplatin for AUC of 5, Alimta 500 Mg/M2 and Keytruda 200 Mg IV every 3 weeks status post 5 cycles.  Starting from cycle #5 she is on maintenance treatment with Alimta and Keytruda every 3 weeks. The patient has been tolerating this treatment well with no concerning adverse effect except for occasional nausea. She also complains of swelling of the lower extremities. I recommended for her to proceed with cycle #6 today as planned. For the swelling of the lower extremity she was treated with Keflex with no improvement.  I will start her on Lasix 10 mg p.o. daily as needed for the swelling but she was also advised to take potassium rich diet. For the nausea, I will give her a refill of Compazine. The patient will come back for follow-up visit in 3 weeks  for evaluation before starting cycle #7. She was advised to call immediately if she has any other concerning symptoms in the interval. The patient voices understanding of current disease status and treatment options and is in agreement with the current care plan.  All questions were answered. The patient knows to call the clinic with any problems, questions or concerns. We can certainly see the patient much sooner if necessary.  The total time spent in the appointment was 30 minutes.  Disclaimer: This note was dictated with voice recognition software. Similar sounding words can inadvertently be transcribed and may not be corrected upon review.

## 2022-06-17 ENCOUNTER — Inpatient Hospital Stay (HOSPITAL_BASED_OUTPATIENT_CLINIC_OR_DEPARTMENT_OTHER): Payer: Medicare Other | Admitting: Internal Medicine

## 2022-06-17 ENCOUNTER — Inpatient Hospital Stay: Payer: Medicare Other | Admitting: Internal Medicine

## 2022-06-17 DIAGNOSIS — R569 Unspecified convulsions: Secondary | ICD-10-CM

## 2022-06-17 DIAGNOSIS — C7931 Secondary malignant neoplasm of brain: Secondary | ICD-10-CM

## 2022-06-17 NOTE — Progress Notes (Signed)
I connected with Kimberly Huang on 06/17/22 at 10:00 AM EST by telephone visit and verified that I am speaking with the correct person using two identifiers.  I discussed the limitations, risks, security and privacy concerns of performing an evaluation and management service by telemedicine and the availability of in-person appointments. I also discussed with the patient that there may be a patient responsible charge related to this service. The patient expressed understanding and agreed to proceed.  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  She continues to experience some nausea and vomiting from chemotherapy.  No seizures.  Denies new or progressive neurologic deficits.   Observations: Language and cognition at baseline  Imaging:  [place] Clinician Interpretation: I have personally reviewed the CNS images as listed.  My interpretation, in the context of the patient's clinical presentation, is likely treatment effect  MR Brain W Wo Contrast  Result Date: 06/11/2022 CLINICAL DATA:  Metastasis to brain ([place]) C79.31 (ICD-10-CM). Brain metastases, assess treatment response; 3T SRS Protocol. EXAM: MRI HEAD WITHOUT AND WITH CONTRAST TECHNIQUE: Multiplanar, multiecho pulse sequences of the brain and surrounding structures were obtained without and with intravenous contrast. COMPARISON:  MRI of the brain March 22, 2022. FINDINGS: Brain: Evidence of mixed treatment response with most lesions decreased in size or resolved and 2 lesion increased in size with increased surrounding vasogenic edema. These lesions are labeled on series 13 as following: Decreased lesions 1. Right cerebellar hemisphere, 2 mm (4 mm on prior), no surrounding edema, image 40. 2. 3. Left  cerebellar hemisphere/vermis 11 mm (13 mm on prior) mild surrounding vasogenic edema, improved compared to prior. 4. 5. Right parietal lobe, 3 mm (4 mm on prior), no surrounding vasogenic edema, image 113. 6. 7. Left frontal lobe, 9 mm (11 mm on prior), with increased surrounding vasogenic edema, image 121. 8. 9. Right postcentral gyrus, 8 mm (9 mm on prior), increased surrounding vasogenic edema, image 121. 10. 11. Left frontal lobe, 2 mm (6 mm on prior), no surrounding vasogenic edema, image 132. 12. Left posteromedial frontal lobe lesion, 2 lesions in the inferior aspect of the right occipital lobe and inferior left cerebellar hemisphere lesion are no longer identified on postcontrast images with punctate foci of susceptibility artifact in their location on the susceptibility weighted images. Increased lesions 1. Right occipital lobe, 19 mm (16 mm on prior), with increased surrounding vasogenic edema, image 79. 2. 3. Left parietal lobe, 13 mm (10 mm on prior), with increased surrounding vasogenic edema, image 111. No new lesion identified. No acute infarct, hemorrhage, hydrocephalus or extra-axial collection. Vascular: Normal flow voids. Skull and upper cervical spine: Normal marrow signal. Sinuses/Orbits: Negative. Other: Incidentally noted small left temporomandibular joint effusion. IMPRESSION: 1. Evidence of mixed treatment response with most lesions decreased in size or resolved and 2 lesions increased in size with increased surrounding vasogenic edema. 2. No new lesion identified. Electronically Signed   By: Pedro Earls M.D.   On: 06/11/2022 13:35   VAS [place] LOWER EXTREMITY VENOUS (DVT)  Result Date: 06/02/2022  Lower Venous DVT Study Patient Name:  Kimberly Huang  Date of Exam:   06/02/2022 Medical Rec #: [id number]         Accession #:    [id number]  Date of Birth: [birth year]          Patient Gender: F Patient Age:   72 years Exam Location:  [facility] Procedure:      VAS [place]  LOWER EXTREMITY VENOUS (DVT) Referring Phys: Vito Backers HEILINGOETTER --------------------------------------------------------------------------------  Indications: Swelling of feet.  Risk Factors: Chemotherapy. Limitations: Body habitus and poor ultrasound/tissue interface. Comparison Study: No previous exams Performing Technologist: Jody Hill RVT, RDMS  Examination Guidelines: A complete evaluation includes B-mode imaging, spectral Doppler, color Doppler, and power Doppler as needed of all accessible portions of each vessel. Bilateral testing is considered an integral part of a complete examination. Limited examinations for reoccurring indications may be performed as noted. The reflux portion of the exam is performed with the patient in reverse Trendelenburg.  +---------+---------------+---------+-----------+----------+-------------------+ RIGHT    CompressibilityPhasicitySpontaneityPropertiesThrombus Aging      +---------+---------------+---------+-----------+----------+-------------------+ CFV      Full           Yes      Yes                                      +---------+---------------+---------+-----------+----------+-------------------+ SFJ      Full                                                             +---------+---------------+---------+-----------+----------+-------------------+ FV Prox  Full           Yes      Yes                                      +---------+---------------+---------+-----------+----------+-------------------+ FV Mid   Full           Yes      Yes                                      +---------+---------------+---------+-----------+----------+-------------------+ FV DistalFull           Yes      Yes                                      +---------+---------------+---------+-----------+----------+-------------------+ PFV      Full                                                              +---------+---------------+---------+-----------+----------+-------------------+ POP      Full           Yes      Yes                                      +---------+---------------+---------+-----------+----------+-------------------+ PTV  Not well visualized +---------+---------------+---------+-----------+----------+-------------------+ PERO                                                  Not well visualized +---------+---------------+---------+-----------+----------+-------------------+   +---------+---------------+---------+-----------+----------+--------------+ LEFT     CompressibilityPhasicitySpontaneityPropertiesThrombus Aging +---------+---------------+---------+-----------+----------+--------------+ CFV      Full           Yes      Yes                                 +---------+---------------+---------+-----------+----------+--------------+ SFJ      Full                                                        +---------+---------------+---------+-----------+----------+--------------+ FV Prox  Full           Yes      Yes                                 +---------+---------------+---------+-----------+----------+--------------+ FV Mid   Full           Yes      Yes                                 +---------+---------------+---------+-----------+----------+--------------+ FV DistalFull           Yes      Yes                                 +---------+---------------+---------+-----------+----------+--------------+ PFV      Full                                                        +---------+---------------+---------+-----------+----------+--------------+ POP      Full           Yes      Yes                                 +---------+---------------+---------+-----------+----------+--------------+ PTV      Full                                                         +---------+---------------+---------+-----------+----------+--------------+ PERO     Full                                                        +---------+---------------+---------+-----------+----------+--------------+  Summary: BILATERAL: - No evidence of deep vein thrombosis seen in the lower extremities, bilaterally. - RIGHT: - A complex cystic structure is found in the popliteal fossa (4.13 x 0.75 x 2.72 cm).  LEFT: - A large complex herterogenous cystic structure is found in the popliteal fossa (6.49 x 1.46 x 3.61 cm).  *See table(s) above for measurements and observations. Electronically signed by Harold Barban MD on 06/02/2022 at 7:11:39 PM.    Final      Assessment and Plan: Metastatic cancer to brain [facility])  Focal seizure ([place])  Kimberly Huang is clinically stable today.  MRI demonstrates mild mixed changes consistent with post-SRS inflammation.    Will con't to monitor with serial imaging, she is agreeable with this.  Follow Up Instructions:  We ask that Kimberly Huang return to clinic in 3 months following next brain MRI, or sooner as needed.  I discussed the assessment and treatment plan with the patient.  The patient was provided an opportunity to ask questions and all were answered.  The patient agreed with the plan and demonstrated understanding of the instructions.    The patient was advised to call back or seek an in-person evaluation if the symptoms worsen or if the condition fails to improve as anticipated.    Ventura Sellers, MD   I provided 22 minutes of non face-to-face telephone visit time during this encounter, and > 50% was spent counseling as documented under my assessment & plan.

## 2022-06-18 ENCOUNTER — Other Ambulatory Visit: Payer: Self-pay

## 2022-06-18 ENCOUNTER — Other Ambulatory Visit: Payer: Self-pay | Admitting: Cardiology

## 2022-06-22 ENCOUNTER — Other Ambulatory Visit: Payer: Self-pay | Admitting: Radiation Therapy

## 2022-06-23 ENCOUNTER — Other Ambulatory Visit: Payer: Self-pay | Admitting: Cardiology

## 2022-06-25 ENCOUNTER — Telehealth: Payer: Self-pay | Admitting: *Deleted

## 2022-06-25 NOTE — Telephone Encounter (Signed)
Returned PC to patient, no answer, left VM - she called earlier to say her BP last night was 100/60, C. Heilingoetter PA informed - she advised that patient hold her lasix for her lower extremity edema unless her BP is up to 120/80.  Patient is also prescribed lopressor 25 mg twice a day by Dr Peter [name].  Advised patient to call this physician regarding her BP readings, as her medication may need to be decreased.  Instructed patient to call this office with any further questions/concerns, [phone].

## 2022-06-29 ENCOUNTER — Telehealth: Payer: Self-pay | Admitting: Cardiology

## 2022-06-29 NOTE — Telephone Encounter (Signed)
Spoke with pt, she is having problems with her blood pressure running low. She has gotten a diagnosis of cancer and they told her that the chemo can affect blood pressure. Her bp has never been low during treatment but she has been checking it at home and is getting low numbers. She is wondering if some of her medications need to be adjusted. She has not been seen since 2022 so follow up scheduled prior to any medication changes. Patient voiced understanding and will bring her bp cuff and all her medications to that appointment.

## 2022-06-29 NOTE — Telephone Encounter (Signed)
Asking that the nurse give her call, calling to discuss her bp issues. Please advise

## 2022-06-30 ENCOUNTER — Other Ambulatory Visit: Payer: Self-pay

## 2022-07-05 ENCOUNTER — Inpatient Hospital Stay: Payer: Medicare Other | Attending: Radiation Oncology

## 2022-07-05 ENCOUNTER — Inpatient Hospital Stay: Payer: Medicare Other

## 2022-07-05 ENCOUNTER — Inpatient Hospital Stay (HOSPITAL_BASED_OUTPATIENT_CLINIC_OR_DEPARTMENT_OTHER): Payer: Medicare Other | Admitting: Internal Medicine

## 2022-07-05 ENCOUNTER — Other Ambulatory Visit: Payer: Self-pay

## 2022-07-05 ENCOUNTER — Encounter: Payer: Self-pay | Admitting: Internal Medicine

## 2022-07-05 ENCOUNTER — Other Ambulatory Visit: Payer: Self-pay | Admitting: Internal Medicine

## 2022-07-05 VITALS — BP 130/72 | HR 63 | Resp 16

## 2022-07-05 VITALS — BP 111/65 | HR 73 | Temp 98.3°F | Resp 17 | Wt 188.1 lb

## 2022-07-05 DIAGNOSIS — M7989 Other specified soft tissue disorders: Secondary | ICD-10-CM | POA: Insufficient documentation

## 2022-07-05 DIAGNOSIS — C3412 Malignant neoplasm of upper lobe, left bronchus or lung: Secondary | ICD-10-CM | POA: Diagnosis present

## 2022-07-05 DIAGNOSIS — Z5112 Encounter for antineoplastic immunotherapy: Secondary | ICD-10-CM | POA: Diagnosis present

## 2022-07-05 DIAGNOSIS — C3492 Malignant neoplasm of unspecified part of left bronchus or lung: Secondary | ICD-10-CM

## 2022-07-05 DIAGNOSIS — C7931 Secondary malignant neoplasm of brain: Secondary | ICD-10-CM | POA: Diagnosis not present

## 2022-07-05 DIAGNOSIS — Z79899 Other long term (current) drug therapy: Secondary | ICD-10-CM | POA: Diagnosis not present

## 2022-07-05 DIAGNOSIS — Z5111 Encounter for antineoplastic chemotherapy: Secondary | ICD-10-CM | POA: Insufficient documentation

## 2022-07-05 DIAGNOSIS — C3490 Malignant neoplasm of unspecified part of unspecified bronchus or lung: Secondary | ICD-10-CM

## 2022-07-05 LAB — CBC WITH DIFFERENTIAL (CANCER CENTER ONLY)
Abs Immature Granulocytes: 0.02 10*3/uL (ref 0.00–0.07)
Basophils Absolute: 0 10*3/uL (ref 0.0–0.1)
Basophils Relative: 0 %
Eosinophils Absolute: 0 10*3/uL (ref 0.0–0.5)
Eosinophils Relative: 1 %
HCT: 29.3 % — ABNORMAL LOW (ref 36.0–46.0)
Hemoglobin: 9.9 g/dL — ABNORMAL LOW (ref 12.0–15.0)
Immature Granulocytes: 0 %
Lymphocytes Relative: 18 %
Lymphs Abs: 1.3 10*3/uL (ref 0.7–4.0)
MCH: 33.8 pg (ref 26.0–34.0)
MCHC: 33.8 g/dL (ref 30.0–36.0)
MCV: 100 fL (ref 80.0–100.0)
Monocytes Absolute: 0.6 10*3/uL (ref 0.1–1.0)
Monocytes Relative: 8 %
Neutro Abs: 5.2 10*3/uL (ref 1.7–7.7)
Neutrophils Relative %: 73 %
Platelet Count: 360 10*3/uL (ref 150–400)
RBC: 2.93 MIL/uL — ABNORMAL LOW (ref 3.87–5.11)
RDW: 16.2 % — ABNORMAL HIGH (ref 11.5–15.5)
WBC Count: 7.2 10*3/uL (ref 4.0–10.5)
nRBC: 0 % (ref 0.0–0.2)

## 2022-07-05 LAB — CMP (CANCER CENTER ONLY)
ALT: 25 U/L (ref 0–44)
AST: 29 U/L (ref 15–41)
Albumin: 3.6 g/dL (ref 3.5–5.0)
Alkaline Phosphatase: 85 U/L (ref 38–126)
Anion gap: 8 (ref 5–15)
BUN: 14 mg/dL (ref 8–23)
CO2: 25 mmol/L (ref 22–32)
Calcium: 8.9 mg/dL (ref 8.9–10.3)
Chloride: 103 mmol/L (ref 98–111)
Creatinine: 0.75 mg/dL (ref 0.44–1.00)
GFR, Estimated: >60 mL/min (ref >60)
Glucose, Bld: 154 mg/dL — ABNORMAL HIGH (ref 70–99)
Potassium: 3.9 mmol/L (ref 3.5–5.1)
Sodium: 136 mmol/L (ref 135–145)
Total Bilirubin: 0.4 mg/dL (ref 0.3–1.2)
Total Protein: 7 g/dL (ref 6.5–8.1)

## 2022-07-05 MED ORDER — PROCHLORPERAZINE MALEATE 10 MG PO TABS
10.0000 mg | ORAL_TABLET | Freq: Once | ORAL | Status: AC
  Administered 2022-07-05: 10 mg via ORAL
  Filled 2022-07-05: qty 1

## 2022-07-05 MED ORDER — SODIUM CHLORIDE 0.9 % IV SOLN
500.0000 mg/m2 | Freq: Once | INTRAVENOUS | Status: AC
  Administered 2022-07-05: 1000 mg via INTRAVENOUS
  Filled 2022-07-05: qty 40

## 2022-07-05 MED ORDER — SODIUM CHLORIDE 0.9 % IV SOLN
200.0000 mg | Freq: Once | INTRAVENOUS | Status: AC
  Administered 2022-07-05: 200 mg via INTRAVENOUS
  Filled 2022-07-05: qty 8

## 2022-07-05 MED ORDER — SODIUM CHLORIDE 0.9 % IV SOLN: Freq: Once | INTRAVENOUS | Status: AC

## 2022-07-05 NOTE — Progress Notes (Signed)
[place] [phone]   [phone]  OFFICE PROGRESS NOTE  Patient, No Pcp Per No address on file  DIAGNOSIS: Stage IV (T1c, N2, M1 C) non-small cell lung cancer, adenocarcinoma with positive KRAS G12C mutation diagnosed in September 2023 and presented with left upper lobe lung mass in addition to left hilar and mediastinal lymphadenopathy as well as innumerable brain metastasis.  PRIOR THERAPY: SRS to multiple brain metastasis under the care of Dr. [name] on February 24, 2022.  CURRENT THERAPY: Palliative systemic chemotherapy with carboplatin for AUC of 5, Alimta 500 Mg/M2 and Keytruda 200 Mg IV every 3 weeks.  Status post 6 cycles.  Starting from cycle #5 the patient will be on maintenance treatment with Alimta and Keytruda every 3 weeks.  INTERVAL HISTORY: Kimberly Huang 72 y.o. female returns to clinic today for follow-up visit accompanied by her daughter-in-law Margie Billet.  The patient is feeling fine today with no concerning complaints except for the generalized fatigue and weakness in the lower extremities.  She has no current chest pain but has shortness of breath with exertion with no cough or hemoptysis.  The swelling of the lower extremity has significantly improved after taking Lasix.  She had some drop of her blood pressure over the weekend because of the blood pressure medication and Lasix but she is feeling much better today.  She takes Zofran once in the evening on as-needed basis.  She also take Compazine twice a day as needed.  She is currently on tramadol by her orthopedic surgery for arthritis of the knee.  She is here today for evaluation before starting cycle #7 of her treatment.  MEDICAL HISTORY: Past Medical History:  Diagnosis Date   Arthritis    knee, hands   CAD S/P PCI OM1 99%-0%; Plan Staged PCI mRCA 90%. 02/19/2017   1. Severe 2 vessel obstructive CAD    - 99% thrombotic occlusion of first OM. This is a bifurcating  vessel.     - 90% mid RCA-segmental 2. Good overall LV dysfunction with lateral HK 3. Moderately elevated LVEDP 4. Successful stenting of the first OM with DES. Distal embolization into the distal lateral OM branch.   Plan: DAPT for one year. Will treat with IV Aggrastat for 18 hours. Start high dose statin, beta blocker. IV Ntg for BP control acutely. Plan for stage PCI of RCA on Monday [id number] if no complication.   Essential hypertension 02/21/2017   Hyperlipidemia with target LDL less than 70 02/21/2017   Lung cancer ([place])    Presence of drug-eluting stent in L Cx: OM1 (Xience SIERRA DES 3.5 x 15) 02/21/2017   STEMI involving left circumflex coronary artery ([place]) 02/19/2017   Tobacco abuse 02/19/2017    ALLERGIES:  is allergic to codeine.  MEDICATIONS:  Current Outpatient Medications  Medication Sig Dispense Refill   acetaminophen (TYLENOL) 500 MG tablet Take 500 mg by mouth as needed for moderate pain.     aspirin 81 MG chewable tablet Chew 1 tablet (81 mg total) by mouth daily.     atorvastatin (LIPITOR) 80 MG tablet TAKE 1 TABLET BY MOUTH DAILY AT 6 PM. (Patient taking differently: Take 80 mg by mouth daily.) 90 tablet 3   cephALEXin (KEFLEX) 500 MG capsule Take 1 capsule (500 mg total) by mouth 4 (four) times daily. 20 capsule 0   Coenzyme Q10 (CO Q 10) 100 MG CAPS Take 100 mg by mouth every evening.     diclofenac  Sodium (VOLTAREN) 1 % GEL Apply 1 Application topically daily as needed (Knee pain).     ezetimibe (ZETIA) 10 MG tablet TAKE 1 TABLET BY MOUTH EVERY DAY 90 tablet 0   folic acid (FOLVITE) 1 MG tablet Take 1 tablet (1 mg total) by mouth daily. 90 tablet 1   furosemide (LASIX) 20 MG tablet Once daily only as needed for swelling of the lower extremities 10 tablet 0   irbesartan (AVAPRO) 300 MG tablet TAKE 1 TABLET BY MOUTH DAILY. PLEASE CONTACT THE OFFICE TO SCHEDULE APPOINTMENT 30 tablet 0   levETIRAcetam (KEPPRA) 500 MG tablet Take 1 tablet (500 mg total) by mouth 2 (two) times  daily. 60 tablet 2   metoprolol tartrate (LOPRESSOR) 25 MG tablet TAKE 1 TABLET BY MOUTH TWICE A DAY 60 tablet 0   nitroGLYCERIN (NITROSTAT) 0.4 MG SL tablet Place 1 tablet (0.4 mg total) under the tongue every 5 (five) minutes x 3 doses as needed for chest pain. 25 tablet 2   ondansetron (ZOFRAN) 8 MG tablet Take 1 tablet (8 mg total) by mouth every 8 (eight) hours as needed for nausea or vomiting. Starting day 3 after chemotherapy (Patient taking differently: Take 8 mg by mouth as needed for nausea or vomiting.) 30 tablet 2   pantoprazole (PROTONIX) 40 MG tablet TAKE 1 TABLET BY MOUTH EVERY DAY 90 tablet 1   prochlorperazine (COMPAZINE) 10 MG tablet Take 1 tablet (10 mg total) by mouth every 6 (six) hours as needed. 30 tablet 2   traMADol (ULTRAM) 50 MG tablet Take 50 mg by mouth daily as needed for moderate pain.     No current facility-administered medications for this visit.    SURGICAL HISTORY:  Past Surgical History:  Procedure Laterality Date   BRONCHIAL BIOPSY  01/15/2022   Procedure: BRONCHIAL BIOPSIES;  Surgeon: [name], MD;  Location: [facility] ENDOSCOPY;  Service: Pulmonary;;   BRONCHIAL BRUSHINGS  01/15/2022   Procedure: BRONCHIAL BRUSHINGS;  Surgeon: [name], MD;  Location: [facility] ENDOSCOPY;  Service: Pulmonary;;   BRONCHIAL NEEDLE ASPIRATION BIOPSY  01/15/2022   Procedure: BRONCHIAL NEEDLE ASPIRATION BIOPSIES;  Surgeon: [name], MD;  Location: [facility] ENDOSCOPY;  Service: Pulmonary;;   CARDIAC CATHETERIZATION  02/21/2017   CHOLECYSTECTOMY     CORONARY STENT INTERVENTION N/A 02/19/2017   Procedure: CORONARY STENT INTERVENTION;  Surgeon: [name], Peter M, MD;  Location: [place] CV LAB;  Service: Cardiovascular;  Laterality: N/A;   CORONARY STENT INTERVENTION N/A 02/21/2017   Procedure: CORONARY STENT INTERVENTION;  Surgeon: Belva Crome, MD;  Location: [place] CV LAB;  Service: Cardiovascular;  Laterality: N/A;   FIDUCIAL MARKER PLACEMENT  01/15/2022    Procedure: FIDUCIAL MARKER PLACEMENT;  Surgeon: [name], MD;  Location: [place];  Service: Pulmonary;;   FINE NEEDLE ASPIRATION  01/15/2022   Procedure: FINE NEEDLE ASPIRATION;  Surgeon: [name], MD;  Location: [place];  Service: Pulmonary;;   HEMOSTASIS CONTROL  01/15/2022   Procedure: HEMOSTASIS CONTROL;  Surgeon: [name], MD;  Location: [facility] ENDOSCOPY;  Service: Pulmonary;;   KNEE ARTHROSCOPY Right 2010   KNEE ARTHROSCOPY WITH SUBCHONDROPLASTY Left 01/09/2020   Procedure: Left knee arthroscopy,partial medial meniscectomy, debridement, subchondroplasty left proximal tibia;  Surgeon: Susa Day, MD;  Location: [name];  Service: Orthopedics;  Laterality: Left;   LEFT HEART CATH AND CORONARY ANGIOGRAPHY N/A 02/19/2017   Procedure: LEFT HEART CATH AND CORONARY ANGIOGRAPHY;  Surgeon: [name], Peter M, MD;  Location: [place] CV LAB;  Service: Cardiovascular;  Laterality: N/A;   TONSILLECTOMY     TUBAL LIGATION     VIDEO BRONCHOSCOPY WITH ENDOBRONCHIAL ULTRASOUND N/A 01/15/2022   Procedure: VIDEO BRONCHOSCOPY WITH ENDOBRONCHIAL ULTRASOUND;  Surgeon: [name], MD;  Location: [facility] ENDOSCOPY;  Service: Pulmonary;  Laterality: N/A;   VIDEO BRONCHOSCOPY WITH RADIAL ENDOBRONCHIAL ULTRASOUND  01/15/2022   Procedure: RADIAL ENDOBRONCHIAL ULTRASOUND;  Surgeon: [name], MD;  Location: [facility] ENDOSCOPY;  Service: Pulmonary;;    REVIEW OF SYSTEMS:  Constitutional: positive for fatigue Eyes: negative Ears, nose, mouth, throat, and face: negative Respiratory: positive for dyspnea on exertion Cardiovascular: negative Gastrointestinal: positive for nausea Genitourinary:negative Integument/breast: negative Hematologic/lymphatic: negative Musculoskeletal:positive for arthralgias and muscle weakness Neurological: negative Behavioral/Psych: negative Endocrine: negative Allergic/Immunologic: negative   PHYSICAL EXAMINATION: General appearance: alert,  cooperative, fatigued, and no distress Head: Normocephalic, without obvious abnormality, atraumatic Neck: no adenopathy, no JVD, supple, symmetrical, trachea midline, and thyroid not enlarged, symmetric, no tenderness/mass/nodules Lymph nodes: Cervical, supraclavicular, and axillary nodes normal. Resp: clear to auscultation bilaterally Back: symmetric, no curvature. ROM normal. No CVA tenderness. Cardio: regular rate and rhythm, S1, S2 normal, no murmur, click, rub or gallop GI: soft, non-tender; bowel sounds normal; no masses,  no organomegaly Extremities: edema trace edema bilateral Neurologic: Alert and oriented X 3, normal strength and tone. Normal symmetric reflexes. Normal coordination and gait  ECOG PERFORMANCE STATUS: 1 - Symptomatic but completely ambulatory  Blood pressure 111/65, pulse 73, temperature 98.3 F (36.8 C), temperature source Oral, resp. rate 17, weight 188 lb 1.6 oz (85.3 kg), SpO2 97 %.  LABORATORY DATA: Lab Results  Component Value Date   WBC 7.2 07/05/2022   HGB 9.9 (L) 07/05/2022   HCT 29.3 (L) 07/05/2022   MCV 100.0 07/05/2022   PLT 360 07/05/2022      Chemistry      Component Value Date/Time   NA 137 06/14/2022 1020   NA 143 12/08/2020 1620   K 3.8 06/14/2022 1020   CL 104 06/14/2022 1020   CO2 23 06/14/2022 1020   BUN 9 06/14/2022 1020   BUN 11 12/08/2020 1620   CREATININE 0.75 06/14/2022 1020      Component Value Date/Time   CALCIUM 9.1 06/14/2022 1020   ALKPHOS 76 06/14/2022 1020   AST 29 06/14/2022 1020   ALT 24 06/14/2022 1020   BILITOT 0.5 06/14/2022 1020       RADIOGRAPHIC STUDIES: MR Brain W Wo Contrast  Result Date: 06/11/2022 CLINICAL DATA:  Metastasis to brain (HCC) C79.31 (ICD-10-CM). Brain metastases, assess treatment response; 3T SRS Protocol. EXAM: MRI HEAD WITHOUT AND WITH CONTRAST TECHNIQUE: Multiplanar, multiecho pulse sequences of the brain and surrounding structures were obtained without and with intravenous contrast.  COMPARISON:  MRI of the brain March 22, 2022. FINDINGS: Brain: Evidence of mixed treatment response with most lesions decreased in size or resolved and 2 lesion increased in size with increased surrounding vasogenic edema. These lesions are labeled on series 13 as following: Decreased lesions 1. Right cerebellar hemisphere, 2 mm (4 mm on prior), no surrounding edema, image 40. 2. 3. Left cerebellar hemisphere/vermis 11 mm (13 mm on prior) mild surrounding vasogenic edema, improved compared to prior. 4. 5. Right parietal lobe, 3 mm (4 mm on prior), no surrounding vasogenic edema, image 113. 6. 7. Left frontal lobe, 9 mm (11 mm on prior), with increased surrounding vasogenic edema, image 121. 8. 9. Right postcentral gyrus, 8 mm (9 mm on prior), increased surrounding vasogenic edema, image 121. 10. 11.  Left frontal lobe, 2 mm (6 mm on prior), no surrounding vasogenic edema, image 132. 12. Left posteromedial frontal lobe lesion, 2 lesions in the inferior aspect of the right occipital lobe and inferior left cerebellar hemisphere lesion are no longer identified on postcontrast images with punctate foci of susceptibility artifact in their location on the susceptibility weighted images. Increased lesions 1. Right occipital lobe, 19 mm (16 mm on prior), with increased surrounding vasogenic edema, image 79. 2. 3. Left parietal lobe, 13 mm (10 mm on prior), with increased surrounding vasogenic edema, image 111. No new lesion identified. No acute infarct, hemorrhage, hydrocephalus or extra-axial collection. Vascular: Normal flow voids. Skull and upper cervical spine: Normal marrow signal. Sinuses/Orbits: Negative. Other: Incidentally noted small left temporomandibular joint effusion. IMPRESSION: 1. Evidence of mixed treatment response with most lesions decreased in size or resolved and 2 lesions increased in size with increased surrounding vasogenic edema. 2. No new lesion identified. Electronically Signed   By: Pedro Earls M.D.   On: 06/11/2022 13:35    ASSESSMENT AND PLAN: This is a very pleasant 72 years old white female with Stage IV (T1c, N2, M1 C) non-small cell lung cancer, adenocarcinoma with positive KRAS G12C mutation diagnosed in September 2023 and presented with left upper lobe lung mass in addition to left hilar and mediastinal lymphadenopathy as well as innumerable brain metastasis. The patient is scheduled for SRS to multiple brain metastasis on February 24, 2022. The patient is currently undergoing systemic chemotherapy with carboplatin for AUC of 5, Alimta 500 Mg/M2 and Keytruda 200 Mg IV every 3 weeks status post 6 cycles.  Starting from cycle #5 she is on maintenance treatment with Alimta and Keytruda every 3 weeks. The patient has been tolerating this treatment well with no concerning adverse effect except for occasional nausea and fatigue. I recommended for her to proceed with cycle #7 today as planned. I will see her back for follow-up visit in 3 weeks for evaluation with repeat CT scan of the chest, abdomen and pelvis for restaging of her disease. For the swelling of the lower extremity, this significantly improved with the treatment of Lasix. The patient is interested in having home health to help with her daily activity.  Will check with her insurance and see if this can be done. She was advised to call immediately if she has any other concerning symptoms in the interval. The patient voices understanding of current disease status and treatment options and is in agreement with the current care plan.  All questions were answered. The patient knows to call the clinic with any problems, questions or concerns. We can certainly see the patient much sooner if necessary.  The total time spent in the appointment was 30 minutes.  Disclaimer: This note was dictated with voice recognition software. Similar sounding words can inadvertently be transcribed and may not be corrected upon  review.

## 2022-07-05 NOTE — Patient Instructions (Signed)
[facility]  Discharge Instructions: Thank you for choosing [facility] to provide your oncology and hematology care.   If you have a lab appointment with the [facility], please go directly to the [facility] and check in at the registration area.   Wear comfortable clothing and clothing appropriate for easy access to any Portacath or PICC line.   We strive to give you quality time with your provider. You may need to reschedule your appointment if you arrive late (15 or more minutes).  Arriving late affects you and other patients whose appointments are after yours.  Also, if you miss three or more appointments without notifying the office, you may be dismissed from the clinic at the provider's discretion.      For prescription refill requests, have your pharmacy contact our office and allow 72 hours for refills to be completed.    Today you received the following chemotherapy and/or immunotherapy agents: Pembrolizumab (Keytruda) and Pemetrexed (Alimta).    To help prevent nausea and vomiting after your treatment, we encourage you to take your nausea medication as directed.  BELOW ARE SYMPTOMS THAT SHOULD BE REPORTED IMMEDIATELY: *FEVER GREATER THAN 100.4 F (38 C) OR HIGHER *CHILLS OR SWEATING *NAUSEA AND VOMITING THAT IS NOT CONTROLLED WITH YOUR NAUSEA MEDICATION *UNUSUAL SHORTNESS OF BREATH *UNUSUAL BRUISING OR BLEEDING *URINARY PROBLEMS (pain or burning when urinating, or frequent urination) *BOWEL PROBLEMS (unusual diarrhea, constipation, pain near the anus) TENDERNESS IN MOUTH AND THROAT WITH OR WITHOUT PRESENCE OF ULCERS (sore throat, sores in mouth, or a toothache) UNUSUAL RASH, SWELLING OR PAIN  UNUSUAL VAGINAL DISCHARGE OR ITCHING   Items with * indicate a potential emergency and should be followed up as soon as possible or go to the Emergency Department if any problems should occur.  Please show the CHEMOTHERAPY ALERT  CARD or IMMUNOTHERAPY ALERT CARD at check-in to the Emergency Department and triage nurse.  Should you have questions after your visit or need to cancel or reschedule your appointment, please contact [facility]  Dept: [phone]  and follow the prompts.  Office hours are 8:00 a.m. to 4:30 p.m. Monday - Friday. Please note that voicemails left after 4:00 p.m. may not be returned until the following business day.  We are closed weekends and major holidays. You have access to a nurse at all times for urgent questions. Please call the main number to the clinic Dept: [phone] and follow the prompts.   For any non-urgent questions, you may also contact your provider using MyChart. We now offer e-Visits for anyone 7 and older to request care online for non-urgent symptoms. For details visit [email or web address].   Also download the MyChart app! Go to the app store, search "MyChart", open the app, select [facility], and log in with your MyChart username and password.  Pembrolizumab Injection What is this medication? PEMBROLIZUMAB (PEM broe LIZ ue mab) treats some types of cancer. It works by helping your immune system slow or stop the spread of cancer cells. It is a monoclonal antibody. This medicine may be used for other purposes; ask your health care provider or pharmacist if you have questions. COMMON BRAND NAME(S): Keytruda What should I tell my care team before I take this medication? They need to know if you have any of these conditions: Allogeneic stem cell transplant (uses someone else's stem cells) Autoimmune diseases, such as Crohn disease, ulcerative colitis, lupus History of chest  radiation Nervous system problems, such as Guillain-Barre syndrome, myasthenia gravis Organ transplant An unusual or allergic reaction to pembrolizumab, other medications, foods, dyes, or preservatives Pregnant or trying to get pregnant Breast-feeding How should I  use this medication? This medication is injected into a vein. It is given by your care team in a hospital or clinic setting. A special MedGuide will be given to you before each treatment. Be sure to read this information carefully each time. Talk to your care team about the use of this medication in children. While it may be prescribed for children as young as 6 months for selected conditions, precautions do apply. Overdosage: If you think you have taken too much of this medicine contact a poison control center or emergency room at once. NOTE: This medicine is only for you. Do not share this medicine with others. What if I miss a dose? Keep appointments for follow-up doses. It is important not to miss your dose. Call your care team if you are unable to keep an appointment. What may interact with this medication? Interactions have not been studied. This list may not describe all possible interactions. Give your health care provider a list of all the medicines, herbs, non-prescription drugs, or dietary supplements you use. Also tell them if you smoke, drink alcohol, or use illegal drugs. Some items may interact with your medicine. What should I watch for while using this medication? Your condition will be monitored carefully while you are receiving this medication. You may need blood work while taking this medication. This medication may cause serious skin reactions. They can happen weeks to months after starting the medication. Contact your care team right away if you notice fevers or flu-like symptoms with a rash. The rash may be red or purple and then turn into blisters or peeling of the skin. You may also notice a red rash with swelling of the face, lips, or lymph nodes in your neck or under your arms. Tell your care team right away if you have any change in your eyesight. Talk to your care team if you may be pregnant. Serious birth defects can occur if you take this medication during pregnancy and  for 4 months after the last dose. You will need a negative pregnancy test before starting this medication. Contraception is recommended while taking this medication and for 4 months after the last dose. Your care team can help you find the option that works for you. Do not breastfeed while taking this medication and for 4 months after the last dose. What side effects may I notice from receiving this medication? Side effects that you should report to your care team as soon as possible: Allergic reactions--skin rash, itching, hives, swelling of the face, lips, tongue, or throat Dry cough, shortness of breath or trouble breathing Eye pain, redness, irritation, or discharge with blurry or decreased vision Heart muscle inflammation--unusual weakness or fatigue, shortness of breath, chest pain, fast or irregular heartbeat, dizziness, swelling of the ankles, feet, or hands Hormone gland problems--headache, sensitivity to light, unusual weakness or fatigue, dizziness, fast or irregular heartbeat, increased sensitivity to cold or heat, excessive sweating, constipation, hair loss, increased thirst or amount of urine, tremors or shaking, irritability Infusion reactions--chest pain, shortness of breath or trouble breathing, feeling faint or lightheaded Kidney injury (glomerulonephritis)--decrease in the amount of urine, red or dark brown urine, foamy or bubbly urine, swelling of the ankles, hands, or feet Liver injury--right upper belly pain, loss of appetite, nausea, light-colored stool, dark  yellow or brown urine, yellowing skin or eyes, unusual weakness or fatigue Pain, tingling, or numbness in the hands or feet, muscle weakness, change in vision, confusion or trouble speaking, loss of balance or coordination, trouble walking, seizures Rash, fever, and swollen lymph nodes Redness, blistering, peeling, or loosening of the skin, including inside the mouth Sudden or severe stomach pain, bloody diarrhea, fever,  nausea, vomiting Side effects that usually do not require medical attention (report to your care team if they continue or are bothersome): Bone, joint, or muscle pain Diarrhea Fatigue Loss of appetite Nausea Skin rash This list may not describe all possible side effects. Call your doctor for medical advice about side effects. You may report side effects to FDA at 1-800-FDA-1088. Where should I keep my medication? This medication is given in a hospital or clinic. It will not be stored at home. NOTE: This sheet is a summary. It may not cover all possible information. If you have questions about this medicine, talk to your doctor, pharmacist, or health care provider.  2023 Elsevier/Gold Standard (2021-09-01 00:00:00) Pemetrexed Injection What is this medication? PEMETREXED (PEM e TREX ed) treats some types of cancer. It works by slowing down the growth of cancer cells. This medicine may be used for other purposes; ask your health care provider or pharmacist if you have questions. COMMON BRAND NAME(S): Alimta, PEMFEXY What should I tell my care team before I take this medication? They need to know if you have any of these conditions: Infection, such as chickenpox, cold sores, or herpes Kidney disease Low blood cell levels (white cells, red cells, and platelets) Lung or breathing disease, such as asthma Radiation therapy An unusual or allergic reaction to pemetrexed, other medications, foods, dyes, or preservatives If you or your partner are pregnant or trying to get pregnant Breast-feeding How should I use this medication? This medication is injected into a vein. It is given by your care team in a hospital or clinic setting. Talk to your care team about the use of this medication in children. Special care may be needed. Overdosage: If you think you have taken too much of this medicine contact a poison control center or emergency room at once. NOTE: This medicine is only for you. Do not  share this medicine with others. What if I miss a dose? Keep appointments for follow-up doses. It is important not to miss your dose. Call your care team if you are unable to keep an appointment. What may interact with this medication? Do not take this medication with any of the following: Live virus vaccines This medication may also interact with the following: Ibuprofen This list may not describe all possible interactions. Give your health care provider a list of all the medicines, herbs, non-prescription drugs, or dietary supplements you use. Also tell them if you smoke, drink alcohol, or use illegal drugs. Some items may interact with your medicine. What should I watch for while using this medication? Your condition will be monitored carefully while you are receiving this medication. This medication may make you feel generally unwell. This is not uncommon as chemotherapy can affect healthy cells as well as cancer cells. Report any side effects. Continue your course of treatment even though you feel ill unless your care team tells you to stop. This medication can cause serious side effects. To reduce the risk, your care team may give you other medications to take before receiving this one. Be sure to follow the directions from your care team. This  medication can cause a rash or redness in areas of the body that have previously had radiation therapy. If you have had radiation therapy, tell your care team if you notice a rash in this area. This medication may increase your risk of getting an infection. Call your care team for advice if you get a fever, chills, sore throat, or other symptoms of a cold or flu. Do not treat yourself. Try to avoid being around people who are sick. Be careful brushing or flossing your teeth or using a toothpick because you may get an infection or bleed more easily. If you have any dental work done, tell your dentist you are receiving this medication. Avoid taking  medications that contain aspirin, acetaminophen, ibuprofen, naproxen, or ketoprofen unless instructed by your care team. These medications may hide a fever. Check with your care team if you have severe diarrhea, nausea, and vomiting, or if you sweat a lot. The loss of too much body fluid may make it dangerous for you to take this medication. Talk to your care team if you or your partner wish to become pregnant or think either of you might be pregnant. This medication can cause serious birth defects if taken during pregnancy and for 6 months after the last dose. A negative pregnancy test is required before starting this medication. A reliable form of contraception is recommended while taking this medication and for 6 months after the last dose. Talk to your care team about reliable forms of contraception. Do not father a child while taking this medication and for 3 months after the last dose. Use a condom while having sex during this time period. Do not breastfeed while taking this medication and for 1 week after the last dose. This medication may cause infertility. Talk to your care team if you are concerned about your fertility. What side effects may I notice from receiving this medication? Side effects that you should report to your care team as soon as possible: Allergic reactions--skin rash, itching, hives, swelling of the face, lips, tongue, or throat Dry cough, shortness of breath or trouble breathing Infection--fever, chills, cough, sore throat, wounds that don't heal, pain or trouble when passing urine, general feeling of discomfort or being unwell Kidney injury--decrease in the amount of urine, swelling of the ankles, hands, or feet Low red blood cell level--unusual weakness or fatigue, dizziness, headache, trouble breathing Redness, blistering, peeling, or loosening of the skin, including inside the mouth Unusual bruising or bleeding Side effects that usually do not require medical attention  (report to your care team if they continue or are bothersome): Fatigue Loss of appetite Nausea Vomiting This list may not describe all possible side effects. Call your doctor for medical advice about side effects. You may report side effects to FDA at 1-800-FDA-1088. Where should I keep my medication? This medication is given in a hospital or clinic. It will not be stored at home. NOTE: This sheet is a summary. It may not cover all possible information. If you have questions about this medicine, talk to your doctor, pharmacist, or health care provider.  2023 Elsevier/Gold Standard (2021-08-24 00:00:00)

## 2022-07-14 ENCOUNTER — Other Ambulatory Visit: Payer: Self-pay | Admitting: Physician Assistant

## 2022-07-14 DIAGNOSIS — M7989 Other specified soft tissue disorders: Secondary | ICD-10-CM

## 2022-07-15 ENCOUNTER — Other Ambulatory Visit: Payer: Self-pay | Admitting: Cardiology

## 2022-07-15 ENCOUNTER — Telehealth: Payer: Self-pay | Admitting: *Deleted

## 2022-07-15 NOTE — Telephone Encounter (Signed)
Returned PC to patient's daughter, Metta Clines, she called earlier stating that the patient is having edema & redness in her legs again since her last treatment and she has a rash under her breasts.  Per Cassie PA, patient can be seen in [facility] tomorrow, appointment made for 3:00.  Patient's daughter informed of appointment, she verbalizes understanding.

## 2022-07-16 ENCOUNTER — Other Ambulatory Visit: Payer: Self-pay

## 2022-07-16 ENCOUNTER — Inpatient Hospital Stay (HOSPITAL_BASED_OUTPATIENT_CLINIC_OR_DEPARTMENT_OTHER): Payer: Medicare Other | Admitting: Physician Assistant

## 2022-07-16 VITALS — BP 120/66 | HR 69 | Temp 98.2°F | Resp 16 | Wt 189.1 lb

## 2022-07-16 DIAGNOSIS — M7989 Other specified soft tissue disorders: Secondary | ICD-10-CM

## 2022-07-16 DIAGNOSIS — C3492 Malignant neoplasm of unspecified part of left bronchus or lung: Secondary | ICD-10-CM

## 2022-07-16 DIAGNOSIS — Z5111 Encounter for antineoplastic chemotherapy: Secondary | ICD-10-CM | POA: Diagnosis not present

## 2022-07-16 LAB — CMP (CANCER CENTER ONLY)
ALT: 44 U/L (ref 0–44)
AST: 44 U/L — ABNORMAL HIGH (ref 15–41)
Albumin: 3.4 g/dL — ABNORMAL LOW (ref 3.5–5.0)
Alkaline Phosphatase: 85 U/L (ref 38–126)
Anion gap: 9 (ref 5–15)
BUN: 14 mg/dL (ref 8–23)
CO2: 25 mmol/L (ref 22–32)
Calcium: 8.7 mg/dL — ABNORMAL LOW (ref 8.9–10.3)
Chloride: 102 mmol/L (ref 98–111)
Creatinine: 0.69 mg/dL (ref 0.44–1.00)
GFR, Estimated: >60 mL/min (ref >60)
Glucose, Bld: 107 mg/dL — ABNORMAL HIGH (ref 70–99)
Potassium: 3.5 mmol/L (ref 3.5–5.1)
Sodium: 136 mmol/L (ref 135–145)
Total Bilirubin: 0.4 mg/dL (ref 0.3–1.2)
Total Protein: 7 g/dL (ref 6.5–8.1)

## 2022-07-16 LAB — CBC (CANCER CENTER ONLY)
HCT: 27.1 % — ABNORMAL LOW (ref 36.0–46.0)
Hemoglobin: 9.4 g/dL — ABNORMAL LOW (ref 12.0–15.0)
MCH: 34.3 pg — ABNORMAL HIGH (ref 26.0–34.0)
MCHC: 34.7 g/dL (ref 30.0–36.0)
MCV: 98.9 fL (ref 80.0–100.0)
Platelet Count: 200 10*3/uL (ref 150–400)
RBC: 2.74 MIL/uL — ABNORMAL LOW (ref 3.87–5.11)
RDW: 15.1 % (ref 11.5–15.5)
WBC Count: 3.5 10*3/uL — ABNORMAL LOW (ref 4.0–10.5)
nRBC: 0 % (ref 0.0–0.2)

## 2022-07-16 MED ORDER — METHYLPREDNISOLONE 4 MG PO TBPK: ORAL_TABLET | ORAL | 0 refills | Status: DC

## 2022-07-16 NOTE — Progress Notes (Signed)
Symptom Management Consult Note [facility]    Patient Care Team: Patient, No Pcp Per as PCP - General (General Practice) [name], Peter M, MD as PCP - Cardiology (Cardiology)    Name / MRN / DOB: Kimberly Huang  [id number]  November 03, 1950   Date of visit: 07/16/2022   Chief Complaint/Reason for visit: leg swelling and rash   Current Therapy: Maintenance with alimta and keytruda  Last treatment:  Day 1   Cycle 7 on 07/05/22   ASSESSMENT & PLAN: Patient is a 72 y.o. female  with oncologic history of metastatic Stage IV (T1c, N2, M1 C) non-small cell lung cancer, adenocarcinoma followed by Dr. [name].  I have viewed most recent oncology note and lab work.    #Metastatic Stage IV (T1c, N2, M1 C) non-small cell lung cancer, adenocarcinoma  - Next appointment with oncologist is 07/26/22   #Leg swelling - Chart review shows patient has been evaluated by primary oncology team for this. Had visit 06/02/22 with negative DVT study for blood clots, did show cystic structures in bilateral popliteal fossa which she followed up with Ortho for and drainage was not recommended.  Also had lower extremity doppler study for bilateral leg pain on 07/29/21 showing bilateral ABI WNL. -On exam today patient has 2+ nonpitting bilateral lower extremity edema with erythema to lower legs.  Skin is dry and flaking.  No sloughing of skin.  Exam is not consistent with SJS or TENS. -CBC with mild neutropenia.  Patient is afebrile.  Discussed neutropenic precautions.  Also shows stable anemia. CMP overall unremarkable. Kidney function is normal. -She does have an upcoming appointment with cardiology 07/19/22 that she can use for further evaluation of leg selling as well. -Engaged in shared decision making with patient and daughter-in-law.  They are eager to rule out cardiac cause of edema.  If that is done at upcoming appointment then patient should trial Medrol Dosepak and Lasix.  They have  prescription for Lasix at home still.  Medrol Dosepak was sent to pharmacy should patient decide to take it after cardiology evaluation.  Antibiotics are not indicated at this time as exam is not consistent with cellulitis. -Strict ED precautions discussed should symptoms worsen.   Heme/Onc History: Oncology History  Adenocarcinoma of left lung, stage 4 (HCC)  02/08/2022 Initial Diagnosis   Adenocarcinoma of left lung, stage 4 ([place])   02/08/2022 Cancer Staging   Staging form: Lung, AJCC 8th Edition - Clinical: Stage IVB (cT1c, cN2, cM1c) - Signed by Curt Bears, MD on 02/08/2022   03/01/2022 -  Chemotherapy   Patient is on Treatment Plan : LUNG Carboplatin (5) + Pemetrexed (500) + Pembrolizumab (200) D1 q21d Induction x 4 cycles / Maintenance Pemetrexed (500) + Pembrolizumab (200) D1 q21d         Interval history-: Kimberly Huang is a 72 y.o. female with oncologic history as above presenting to [facility] today with chief complaint of leg swelling.  She is accompanied by her daughter-in-law who provides additional history.  Patient has had ongoing leg swelling since the end of January.  She has been evaluated for this by oncologist at previous visits.  She had a workup negative for DVT and tried a course of Keflex without much improvement.  She did take a short course of Lasix earlier this month and had minimal swelling improvement.  Patient has intermittent throbbing pain localized to her feet that is not very bothersome, she rates it 2 out of 10  in severity.  She notes that her swelling does not improve with elevation.  She is unable to wear compression socks as they are very uncomfortable.  She has been trying to eat a low-salt diet.  Despite that change she has not noticed any improvement in her swelling.  She denies any fever, chills, shortness of breath or chest pain.  No over-the-counter medications taken prior to arrival today.  Patient is concerned that symptoms could be related to her  current treatment as she thinks that is around when the swelling first started.    ROS  All other systems are reviewed and are negative for acute change except as noted in the HPI.    Allergies  Allergen Reactions   Codeine Nausea Only     Past Medical History:  Diagnosis Date   Arthritis    knee, hands   CAD S/P PCI OM1 99%-0%; Plan Staged PCI mRCA 90%. 02/19/2017   1. Severe 2 vessel obstructive CAD    - 99% thrombotic occlusion of first OM. This is a bifurcating vessel.     - 90% mid RCA-segmental 2. Good overall LV dysfunction with lateral HK 3. Moderately elevated LVEDP 4. Successful stenting of the first OM with DES. Distal embolization into the distal lateral OM branch.   Plan: DAPT for one year. Will treat with IV Aggrastat for 18 hours. Start high dose statin, beta blocker. IV Ntg for BP control acutely. Plan for stage PCI of RCA on Monday [id number] if no complication.   Essential hypertension 02/21/2017   Hyperlipidemia with target LDL less than 70 02/21/2017   Lung cancer ([place])    Presence of drug-eluting stent in L Cx: OM1 (Xience SIERRA DES 3.5 x 15) 02/21/2017   STEMI involving left circumflex coronary artery ([place]) 02/19/2017   Tobacco abuse 02/19/2017     Past Surgical History:  Procedure Laterality Date   BRONCHIAL BIOPSY  01/15/2022   Procedure: BRONCHIAL BIOPSIES;  Surgeon: [name], MD;  Location: [facility] ENDOSCOPY;  Service: Pulmonary;;   BRONCHIAL BRUSHINGS  01/15/2022   Procedure: BRONCHIAL BRUSHINGS;  Surgeon: [name], MD;  Location: [facility] ENDOSCOPY;  Service: Pulmonary;;   BRONCHIAL NEEDLE ASPIRATION BIOPSY  01/15/2022   Procedure: BRONCHIAL NEEDLE ASPIRATION BIOPSIES;  Surgeon: [name], MD;  Location[facility] ENDOSCOPY;  Service: Pulmonary;;   CARDIAC CATHETERIZATION  02/21/2017   CHOLECYSTECTOMY     CORONARY STENT INTERVENTION N/A 02/19/2017   Procedure: CORONARY STENT INTERVENTION;  Surgeon: [name], Peter M, MD;  Location: [place] CV  LAB;  Service: Cardiovascular;  Laterality: N/A;   CORONARY STENT INTERVENTION N/A 02/21/2017   Procedure: CORONARY STENT INTERVENTION;  Surgeon: Belva Crome, MD;  Location: [place] [facility] LAB;  Service: Cardiovascular;  Laterality: N/A;   FIDUCIAL MARKER PLACEMENT  01/15/2022   Procedure: FIDUCIAL MARKER PLACEMENT;  Surgeon: [name], MD;  Location: [place];  Service: Pulmonary;;   FINE NEEDLE ASPIRATION  01/15/2022   Procedure: FINE NEEDLE ASPIRATION;  Surgeon: [name], MD;  Location: [place];  Service: Pulmonary;;   HEMOSTASIS CONTROL  01/15/2022   Procedure: HEMOSTASIS CONTROL;  Surgeon: [name], MD;  Location: [facility] ENDOSCOPY;  Service: Pulmonary;;   KNEE ARTHROSCOPY Right 2010   KNEE ARTHROSCOPY WITH SUBCHONDROPLASTY Left 01/09/2020   Procedure: Left knee arthroscopy,partial medial meniscectomy, debridement, subchondroplasty left proximal tibia;  Surgeon: Susa Day, MD;  Location: [name];  Service: Orthopedics;  Laterality: Left;   LEFT HEART CATH AND CORONARY ANGIOGRAPHY N/A  02/19/2017   Procedure: LEFT HEART CATH AND CORONARY ANGIOGRAPHY;  Surgeon: [name], Peter M, MD;  Location: [place] [facility] LAB;  Service: Cardiovascular;  Laterality: N/A;   TONSILLECTOMY     TUBAL LIGATION     VIDEO BRONCHOSCOPY WITH ENDOBRONCHIAL ULTRASOUND N/A 01/15/2022   Procedure: VIDEO BRONCHOSCOPY WITH ENDOBRONCHIAL ULTRASOUND;  Surgeon: [name], MD;  Location: [facility] ENDOSCOPY;  Service: Pulmonary;  Laterality: N/A;   VIDEO BRONCHOSCOPY WITH RADIAL ENDOBRONCHIAL ULTRASOUND  01/15/2022   Procedure: RADIAL ENDOBRONCHIAL ULTRASOUND;  Surgeon: [name], MD;  Location: [facility] ENDOSCOPY;  Service: Pulmonary;;    Social History   Socioeconomic History   Marital status: Married    Spouse name: Not on file   Number of children: 3   Years of education: Not on file   Highest education level: Not on file  Occupational History   Not on file  Tobacco Use    Smoking status: Some Days    Packs/day: 1.50    Years: 50.00    Additional pack years: 0.00    Total pack years: 75.00    Types: Cigarettes   Smokeless tobacco: Never   Tobacco comments:    Trying to quit, currently smokes 5-6 cigarettes per day 01/21/2022  Vaping Use   Vaping Use: Never used  Substance and Sexual Activity   Alcohol use: Yes    Comment: occasional   Drug use: Never   Sexual activity: Not Currently    Birth control/protection: Surgical    Comment: Hysterectomy//Tubal Ligation  Other Topics Concern   Not on file  Social History Narrative   Family runs the [employer].   Social Determinants of Health   Financial Resource Strain: Not on file  Food Insecurity: No Food Insecurity (03/22/2022)   Hunger Vital Sign    Worried About Running Out of Food in the Last Year: Never true    Ran Out of Food in the Last Year: Never true  Transportation Needs: No Transportation Needs (03/22/2022)   PRAPARE - Hydrologist (Medical): No    Lack of Transportation (Non-Medical): No  Physical Activity: Not on file  Stress: Not on file  Social Connections: Not on file  Intimate Partner Violence: Not At Risk (03/22/2022)   Humiliation, Afraid, Rape, and Kick questionnaire    Fear of Current or Ex-Partner: No    Emotionally Abused: No    Physically Abused: No    Sexually Abused: No    Family History  Problem Relation Age of Onset   Diabetes Mother    Lung cancer Father    Heart attack Sister      Current Outpatient Medications:    methylPREDNISolone (MEDROL DOSEPAK) 4 MG TBPK tablet, Take 6 pills by mouth day 1, 5 on day 2, 4 on day 3, 3 on day 4, 2 on day 5, 1 on day 6, Disp: 21 tablet, Rfl: 0   acetaminophen (TYLENOL) 500 MG tablet, Take 500 mg by mouth as needed for moderate pain., Disp: , Rfl:    aspirin 81 MG chewable tablet, Chew 1 tablet (81 mg total) by mouth daily., Disp: , Rfl:    atorvastatin (LIPITOR) 80 MG tablet, TAKE 1  TABLET BY MOUTH DAILY AT 6 PM. (Patient taking differently: Take 80 mg by mouth daily.), Disp: 90 tablet, Rfl: 3   Coenzyme Q10 (CO Q 10) 100 MG CAPS, Take 100 mg by mouth every evening., Disp: , Rfl:    diclofenac Sodium (VOLTAREN) 1 %  GEL, Apply 1 Application topically daily as needed (Knee pain)., Disp: , Rfl:    ezetimibe (ZETIA) 10 MG tablet, TAKE 1 TABLET BY MOUTH EVERY DAY, Disp: 90 tablet, Rfl: 0   folic acid (FOLVITE) 1 MG tablet, Take 1 tablet (1 mg total) by mouth daily., Disp: 90 tablet, Rfl: 1   furosemide (LASIX) 20 MG tablet, Once daily only as needed for swelling of the lower extremities (Patient not taking: Reported on 07/05/2022), Disp: 10 tablet, Rfl: 0   irbesartan (AVAPRO) 300 MG tablet, TAKE 1 TABLET BY MOUTH DAILY. PLEASE CONTACT THE OFFICE TO SCHEDULE APPOINTMENT, Disp: 30 tablet, Rfl: 0   levETIRAcetam (KEPPRA) 500 MG tablet, TAKE 1 TABLET BY MOUTH TWICE A DAY, Disp: 180 tablet, Rfl: 1   metoprolol tartrate (LOPRESSOR) 25 MG tablet, TAKE 1 TABLET BY MOUTH TWICE A DAY, Disp: 60 tablet, Rfl: 0   nitroGLYCERIN (NITROSTAT) 0.4 MG SL tablet, Place 1 tablet (0.4 mg total) under the tongue every 5 (five) minutes x 3 doses as needed for chest pain. (Patient not taking: Reported on 07/05/2022), Disp: 25 tablet, Rfl: 2   ondansetron (ZOFRAN) 8 MG tablet, Take 1 tablet (8 mg total) by mouth every 8 (eight) hours as needed for nausea or vomiting. Starting day 3 after chemotherapy (Patient taking differently: Take 8 mg by mouth as needed for nausea or vomiting.), Disp: 30 tablet, Rfl: 2   pantoprazole (PROTONIX) 40 MG tablet, TAKE 1 TABLET BY MOUTH EVERY DAY, Disp: 90 tablet, Rfl: 1   prochlorperazine (COMPAZINE) 10 MG tablet, Take 1 tablet (10 mg total) by mouth every 6 (six) hours as needed., Disp: 30 tablet, Rfl: 2   traMADol (ULTRAM) 50 MG tablet, Take 50 mg by mouth daily as needed for moderate pain., Disp: , Rfl:   PHYSICAL EXAM: ECOG FS:1 - Symptomatic but completely ambulatory     Vitals:   07/16/22 1504  BP: 120/66  Pulse: 69  Resp: 16  Temp: 98.2 F (36.8 C)  TempSrc: Oral  SpO2: 98%  Weight: 189 lb 1.6 oz (85.8 kg)   Physical Exam Vitals and nursing note reviewed.  Constitutional:      Appearance: She is well-developed. She is not ill-appearing or toxic-appearing.  HENT:     Head: Normocephalic.     Nose: Nose normal.  Eyes:     Conjunctiva/sclera: Conjunctivae normal.  Neck:     Vascular: No JVD.  Cardiovascular:     Rate and Rhythm: Normal rate and regular rhythm.     Pulses: Normal pulses.          Dorsalis pedis pulses are 2+ on the right side and 2+ on the left side.     Heart sounds: Normal heart sounds.  Pulmonary:     Effort: Pulmonary effort is normal.     Breath sounds: Normal breath sounds.  Abdominal:     General: There is no distension.  Musculoskeletal:     Cervical back: Normal range of motion.     Right lower leg: 2+ Edema present.     Left lower leg: 2+ Edema present.     Comments: Please see media below. Skin on lower legs is dry and peeling.  No skin sloughing.  Lower legs with circumferential erythema. Able to wiggle toes.  Compartments in lower extremities are soft.  Neurovascular intact distally.  Full range of motion of bilateral lower extremities.  Skin:    General: Skin is warm and dry.  Neurological:     Mental Status:  She is oriented to person, place, and time.        LABORATORY DATA: I have reviewed the data as listed    Latest Ref Rng & Units 07/16/2022    3:21 PM 07/05/2022   10:24 AM 06/14/2022   10:20 AM  CBC  WBC 4.0 - 10.5 K/uL 3.5  7.2  5.8   Hemoglobin 12.0 - 15.0 g/dL 9.4  9.9  10.6   Hematocrit 36.0 - 46.0 % 27.1  29.3  31.0   Platelets 150 - 400 K/uL 200  360  314         Latest Ref Rng & Units 07/16/2022    3:21 PM 07/05/2022   10:24 AM 06/14/2022   10:20 AM  CMP  Glucose 70 - 99 mg/dL 107  154  128   BUN 8 - 23 mg/dL 14  14  9    Creatinine 0.44 - 1.00 mg/dL 0.69  0.75  0.75   Sodium  135 - 145 mmol/L 136  136  137   Potassium 3.5 - 5.1 mmol/L 3.5  3.9  3.8   Chloride 98 - 111 mmol/L 102  103  104   CO2 22 - 32 mmol/L 25  25  23    Calcium 8.9 - 10.3 mg/dL 8.7  8.9  9.1   Total Protein 6.5 - 8.1 g/dL 7.0  7.0  6.9   Total Bilirubin 0.3 - 1.2 mg/dL 0.4  0.4  0.5   Alkaline Phos 38 - 126 U/L 85  85  76   AST 15 - 41 U/L 44  29  29   ALT 0 - 44 U/L 44  25  24        RADIOGRAPHIC STUDIES (from last 24 hours if applicable) I have personally reviewed the radiological images as listed and agreed with the findings in the report. No results found.      Visit Diagnosis: 1. Leg swelling   2. Adenocarcinoma of left lung, stage 4 (HCC)      No orders of the defined types were placed in this encounter.   All questions were answered. The patient knows to call the clinic with any problems, questions or concerns. No barriers to learning was detected.  I have spent a total of 30 minutes minutes of face-to-face and non-face-to-face time, preparing to see the patient, obtaining and/or reviewing separately obtained history, performing a medically appropriate examination, counseling and educating the patient, ordering tests, documenting clinical information in the electronic health record, and care coordination (communications with other health care professionals or caregivers).    Thank you for allowing me to participate in the care of this patient.    Barrie Folk, PA-C Department of Hematology/Oncology [facility] at [facility] Phone: [phone]  [phone]    07/16/2022 4:28 PM

## 2022-07-17 NOTE — Progress Notes (Signed)
Cardiology Office Note    Date:  07/19/2022   ID:  Kimberly Huang, DOB 1950-05-04, MRN [id number]  PCP:  Patient, No Pcp Per  Cardiologist:  Dr. [place]azil[place]  Chief Complaint  Patient presents with   Leg Swelling    History of Present Illness:  Kimberly Huang is a 72 y.o. female with past medical history of hypertension, hyperlipidemia, tobacco abuse and CAD.  Patient presented in October 2018 with chest pain and a posterior STEMI.  She ultimately underwent DES to her left circumflex, she also had residual RCA disease and that was intervened 48 hours later.  Ejection fraction was preserved on LV gram.    In September 2021 she underwent arthroscopic left knee surgery.   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 She had SSR for brain met. On therapy with Keytruda and alimta.  She is experiencing significant LE edema. She has been taking lasix occasionally but concerned about BP getting too low. She is due to have 3 more rounds of chemo. She denies any chest pain. States she has almost quit smoking.   Past Medical History:  Diagnosis Date   Arthritis    knee, hands   CAD S/P PCI OM1 99%-0%; Plan Staged PCI mRCA 90%. 02/19/2017   1. Severe 2 vessel obstructive CAD    - 99% thrombotic occlusion of first OM. This is a bifurcating vessel.     - 90% mid RCA-segmental 2. Good overall LV dysfunction with lateral HK 3. Moderately elevated LVEDP 4. Successful stenting of the first OM with DES. Distal embolization into the distal lateral OM branch.   Plan: DAPT for one year. Will treat with IV Aggrastat for 18 hours. Start high dose statin, beta blocker. IV Ntg for BP control acutely. Plan for stage PCI of RCA on Monday 10/22 if no complication.   Essential hypertension 02/21/2017   Hyperlipidemia with target LDL less than 70 02/21/2017   Lung cancer (HCC)    Presence of drug-eluting stent in L Cx: OM1 (Xience SIERRA DES 3.5 x 15) 02/21/2017   STEMI  involving left circumflex coronary artery (HCC) 02/19/2017   Tobacco abuse 02/19/2017    Past Surgical History:  Procedure Laterality Date   BRONCHIAL BIOPSY  01/15/2022   Procedure: BRONCHIAL BIOPSIES;  Surgeon: Omar Person, MD;  Location: [facility] ENDOSCOPY;  Service: Pulmonary;;   BRONCHIAL BRUSHINGS  01/15/2022   Procedure: BRONCHIAL BRUSHINGS;  Surgeon: Omar Person, MD;  Location: [facility] ENDOSCOPY;  Service: Pulmonary;;   BRONCHIAL NEEDLE ASPIRATION BIOPSY  01/15/2022   Procedure: BRONCHIAL NEEDLE ASPIRATION BIOPSIES;  Surgeon: Omar Person, MD;  Location: [facility] ENDOSCOPY;  Service: Pulmonary;;   CARDIAC CATHETERIZATION  02/21/2017   CHOLECYSTECTOMY     CORONARY STENT INTERVENTION N/A 02/19/2017   Procedure: CORONARY STENT INTERVENTION;  Surgeon: [place], Faithlynn Deeley M, MD;  Location: [facility] INVASIVE CV LAB;  Service: Cardiovascular;  Laterality: N/A;   CORONARY STENT INTERVENTION N/A 02/21/2017   Procedure: CORONARY STENT INTERVENTION;  Surgeon: Lyn Records, MD;  Location: [facility] INVASIVE CV LAB;  Service: Cardiovascular;  Laterality: N/A;   FIDUCIAL MARKER PLACEMENT  01/15/2022   Procedure: FIDUCIAL MARKER PLACEMENT;  Surgeon: Omar Person, MD;  Location: [facility] ENDOSCOPY;  Service: Pulmonary;;   FINE NEEDLE ASPIRATION  01/15/2022   Procedure: FINE NEEDLE ASPIRATION;  Surgeon: Omar Person, MD;  Location: [facility] ENDOSCOPY;  Service: Pulmonary;;   HEMOSTASIS CONTROL  01/15/2022   Procedure: HEMOSTASIS  CONTROL;  Surgeon: Omar Person, MD;  Location: [facility] ENDOSCOPY;  Service: Pulmonary;;   KNEE ARTHROSCOPY Right 2010   KNEE ARTHROSCOPY WITH SUBCHONDROPLASTY Left 01/09/2020   Procedure: Left knee arthroscopy,partial medial meniscectomy, debridement, subchondroplasty left proximal tibia;  Surgeon: Jene Every, MD;  Location: [name];  Service: Orthopedics;  Laterality: Left;   LEFT HEART CATH AND CORONARY ANGIOGRAPHY N/A 02/19/2017   Procedure: LEFT HEART CATH AND CORONARY ANGIOGRAPHY;   Surgeon: [place], Lener Ventresca M, MD;  Location: [facility] INVASIVE CV LAB;  Service: Cardiovascular;  Laterality: N/A;   TONSILLECTOMY     TUBAL LIGATION     VIDEO BRONCHOSCOPY WITH ENDOBRONCHIAL ULTRASOUND N/A 01/15/2022   Procedure: VIDEO BRONCHOSCOPY WITH ENDOBRONCHIAL ULTRASOUND;  Surgeon: Omar Person, MD;  Location: [facility] ENDOSCOPY;  Service: Pulmonary;  Laterality: N/A;   VIDEO BRONCHOSCOPY WITH RADIAL ENDOBRONCHIAL ULTRASOUND  01/15/2022   Procedure: RADIAL ENDOBRONCHIAL ULTRASOUND;  Surgeon: Omar Person, MD;  Location: [facility] ENDOSCOPY;  Service: Pulmonary;;    Current Medications: Outpatient Medications Prior to Visit  Medication Sig Dispense Refill   acetaminophen (TYLENOL) 500 MG tablet Take 500 mg by mouth as needed for moderate pain.     aspirin 81 MG chewable tablet Chew 1 tablet (81 mg total) by mouth daily.     atorvastatin (LIPITOR) 80 MG tablet TAKE 1 TABLET BY MOUTH DAILY AT 6 PM. (Patient taking differently: Take 80 mg by mouth daily.) 90 tablet 3   Coenzyme Q10 (CO Q 10) 100 MG CAPS Take 100 mg by mouth every evening.     diclofenac Sodium (VOLTAREN) 1 % GEL Apply 1 Application topically daily as needed (Knee pain).     ezetimibe (ZETIA) 10 MG tablet TAKE 1 TABLET BY MOUTH EVERY DAY 90 tablet 0   folic acid (FOLVITE) 1 MG tablet Take 1 tablet (1 mg total) by mouth daily. 90 tablet 1   furosemide (LASIX) 20 MG tablet Once daily only as needed for swelling of the lower extremities 10 tablet 0   irbesartan (AVAPRO) 300 MG tablet TAKE 1 TABLET BY MOUTH DAILY. PLEASE CONTACT THE OFFICE TO SCHEDULE APPOINTMENT 30 tablet 0   levETIRAcetam (KEPPRA) 500 MG tablet TAKE 1 TABLET BY MOUTH TWICE A DAY 180 tablet 1   metoprolol tartrate (LOPRESSOR) 25 MG tablet TAKE 1 TABLET BY MOUTH TWICE A DAY 60 tablet 0   nitroGLYCERIN (NITROSTAT) 0.4 MG SL tablet Place 1 tablet (0.4 mg total) under the tongue every 5 (five) minutes x 3 doses as needed for chest pain. 25 tablet 2   ondansetron (ZOFRAN) 8 MG  tablet Take 1 tablet (8 mg total) by mouth every 8 (eight) hours as needed for nausea or vomiting. Starting day 3 after chemotherapy (Patient taking differently: Take 8 mg by mouth as needed for nausea or vomiting.) 30 tablet 2   pantoprazole (PROTONIX) 40 MG tablet TAKE 1 TABLET BY MOUTH EVERY DAY 90 tablet 1   prochlorperazine (COMPAZINE) 10 MG tablet Take 1 tablet (10 mg total) by mouth every 6 (six) hours as needed. 30 tablet 2   traMADol (ULTRAM) 50 MG tablet Take 50 mg by mouth daily as needed for moderate pain.     methylPREDNISolone (MEDROL DOSEPAK) 4 MG TBPK tablet Take 6 pills by mouth day 1, 5 on day 2, 4 on day 3, 3 on day 4, 2 on day 5, 1 on day 6 21 tablet 0   No facility-administered medications prior to visit.     Allergies:   Codeine  Social History   Socioeconomic History   Marital status: Married    Spouse name: Not on file   Number of children: 3   Years of education: Not on file   Highest education level: Not on file  Occupational History   Not on file  Tobacco Use   Smoking status: Some Days    Packs/day: 1.50    Years: 50.00    Additional pack years: 0.00    Total pack years: 75.00    Types: Cigarettes   Smokeless tobacco: Never   Tobacco comments:    Trying to quit, currently smokes 5-6 cigarettes per day 01/21/2022  Vaping Use   Vaping Use: Never used  Substance and Sexual Activity   Alcohol use: Yes    Comment: occasional   Drug use: Never   Sexual activity: Not Currently    Birth control/protection: Surgical    Comment: Hysterectomy//Tubal Ligation  Other Topics Concern   Not on file  Social History Narrative   Family runs the [employer].   Social Determinants of Health   Financial Resource Strain: Not on file  Food Insecurity: No Food Insecurity (03/22/2022)   Hunger Vital Sign    Worried About Running Out of Food in the Last Year: Never true    Ran Out of Food in the Last Year: Never true  Transportation Needs: No Transportation  Needs (03/22/2022)   PRAPARE - Administrator, Civil Service (Medical): No    Lack of Transportation (Non-Medical): No  Physical Activity: Not on file  Stress: Not on file  Social Connections: Not on file     Family History:  The patient's family history includes Diabetes in her mother; Heart attack in her sister; Lung cancer in her father.   ROS:   Please see the history of present illness.    ROS All other systems reviewed and are negative.   PHYSICAL EXAM:   VS:  BP 123/75   Pulse 63   Ht 5\' 2"  (1.575 m)   Wt 189 lb (85.7 kg)   SpO2 98%   BMI 34.57 kg/m    GENERAL:  Well appearing, overweight,,  WF in NAD HEENT:  PERRL, EOMI, sclera are clear. Oropharynx is clear. NECK:  No jugular venous distention, carotid upstroke brisk and symmetric, no bruits, no thyromegaly or adenopathy LUNGS:  Clear to auscultation bilaterally CHEST:  Unremarkable HEART:  RRR,  PMI not displaced or sustained,S1 and S2 within normal limits, no S3, no S4: no clicks, no rubs, no murmurs ABD:  Soft, nontender. BS +, no masses or bruits. No hepatomegaly, no splenomegaly EXT:  2 + pulses throughout,2+ edema, skin in legs dry and scaly SKIN:  Warm and dry.  No rashes NEURO:  Alert and oriented x 3. Cranial nerves II through XII intact. PSYCH:  Cognitively intact    Wt Readings from Last 3 Encounters:  07/19/22 189 lb (85.7 kg)  07/16/22 189 lb 1.6 oz (85.8 kg)  07/05/22 188 lb 1.6 oz (85.3 kg)      Studies/Labs Reviewed:   EKG:  EKG is not ordered today.     Recent Labs: 06/14/2022: TSH 1.568 07/16/2022: ALT 44; BUN 14; Creatinine 0.69; Hemoglobin 9.4; Platelet Count 200; Potassium 3.5; Sodium 136   Lipid Panel    Component Value Date/Time   CHOL 141 12/08/2020 1620   TRIG 133 12/08/2020 1620   HDL 57 12/08/2020 1620   CHOLHDL 2.5 12/08/2020 1620   CHOLHDL 4.0 02/20/2017 0524  VLDL 16 02/20/2017 0524   LDLCALC 61 12/08/2020 1620    Additional studies/ records that were  reviewed today include:   Cath 02/19/2017 Conclusion     Prox LAD to Mid LAD lesion, 20 %stenosed. Prox RCA to Mid RCA lesion, 90 %stenosed. Prox Cx to Mid Cx lesion, 30 %stenosed. The left ventricular systolic function is normal. LV end diastolic pressure is moderately elevated. The left ventricular ejection fraction is 50-55% by visual estimate. 1st Mrg lesion, 99 %stenosed. A STENT SIERRA 3.50 X 15 MM drug eluting stent was successfully placed. Post intervention, there is a 0% residual stenosis. Lat 1st Mrg lesion, 100 %stenosed.   1. Severe 2 vessel obstructive CAD    - 99% thrombotic occlusion of first OM. This is a bifurcating vessel.     - 90% mid RCA-segmental 2. Good overall LV dysfunction with lateral HK 3. Moderately elevated LVEDP 4. Successful stenting of the first OM with DES. Distal embolization into the distal lateral OM branch.   Plan: DAPT for one year. Will treat with IV Aggrastat for 18 hours. Start high dose statin, beta blocker. IV Ntg for BP control acutely. Plan for stage PCI of RCA on Monday if no complication.      Cath 02/21/2017 Conclusion   Successful stent implantation in the proximal to mid RCA reducing a segmental 90% stenosis to 0% with TIMI grade 3 flow. Stent used was a 3.0 x 26 Onyx post dilated to 14 atm 2.   RECOMMENDATIONS:   Continue dual antiplatelet therapy (aspirin and Brilinta) Discharge per discretion of IC team.       ASSESSMENT:    1. Coronary artery disease involving native coronary artery of native heart without angina pectoris   2. Hyperlipidemia with target LDL less than 70   3. Essential hypertension   4. Tobacco abuse   5. Malignant neoplasm of right lung, unspecified part of lung (HCC)   6. Edema, unspecified type      PLAN:  In order of problems listed above:  CAD: s/p STEMI in October 2018 with DES the LCx and RCA. She is asymptomatic. Continue ASA, lipitor, beta blocker.   Hypertension: Blood pressure  is  controlled. Concerned about low BP with diuretics. Will reduce irbesartan to 150 mg daily. Continue current meds  Hyperlipidemia: on high dose lipitor and Zetia. Will update labs today.   Tobacco abuse:  Counseled on complete cessation.   5.   Stage IV small cell lung CA with brain mets.   6.   LE edema. Likely related to chemotherapy. Will check BNP level. Update Echo. Recommend she take lasix 20 mg daily. Restrict sodium intake. Compression hose.     Medication Adjustments/Labs and Tests Ordered: Current medicines are reviewed at length with the patient today.  Concerns regarding medicines are outlined above.  Medication changes, Labs and Tests ordered today are listed in the Patient Instructions below. Patient Instructions  Reduce irbesartan to 150 mg daily  You may take lasix 20 mg daily  Restrict your sodium intake  Wear compression hose  We will schedule you for lab work and an Echocardiogram   Follow up in 2 months.  Signed, Kynzley Dowson Sw[place], MD  07/19/2022 4:02 PM    [facility] [address] [place], [place], [place]  [address] Phone: [phone]; Fax: [phone]

## 2022-07-19 ENCOUNTER — Other Ambulatory Visit: Payer: Self-pay | Admitting: Cardiology

## 2022-07-19 ENCOUNTER — Ambulatory Visit: Payer: Medicare Other | Attending: Cardiology | Admitting: Cardiology

## 2022-07-19 ENCOUNTER — Encounter: Payer: Self-pay | Admitting: Cardiology

## 2022-07-19 VITALS — BP 123/75 | HR 63 | Ht 62.0 in | Wt 189.0 lb

## 2022-07-19 DIAGNOSIS — Z720 Tobacco use: Secondary | ICD-10-CM | POA: Diagnosis not present

## 2022-07-19 DIAGNOSIS — E785 Hyperlipidemia, unspecified: Secondary | ICD-10-CM

## 2022-07-19 DIAGNOSIS — I2510 Atherosclerotic heart disease of native coronary artery without angina pectoris: Secondary | ICD-10-CM | POA: Diagnosis not present

## 2022-07-19 DIAGNOSIS — I10 Essential (primary) hypertension: Secondary | ICD-10-CM

## 2022-07-19 DIAGNOSIS — C3491 Malignant neoplasm of unspecified part of right bronchus or lung: Secondary | ICD-10-CM

## 2022-07-19 DIAGNOSIS — R609 Edema, unspecified: Secondary | ICD-10-CM

## 2022-07-19 MED ORDER — IRBESARTAN 150 MG PO TABS: 150.0000 mg | ORAL_TABLET | Freq: Every day | ORAL | 3 refills | Status: DC

## 2022-07-19 MED ORDER — FUROSEMIDE 20 MG PO TABS: ORAL_TABLET | ORAL | 6 refills | Status: DC

## 2022-07-19 NOTE — Addendum Note (Signed)
Addended by: Kathyrn Lass on: 07/19/2022 04:07 PM   Modules accepted: Orders

## 2022-07-19 NOTE — Patient Instructions (Signed)
Reduce irbesartan to 150 mg daily  You may take lasix 20 mg daily  Restrict your sodium intake  Wear compression hose  We will schedule you for lab work and an Echocardiogram

## 2022-07-20 ENCOUNTER — Other Ambulatory Visit: Payer: Self-pay | Admitting: Cardiology

## 2022-07-20 ENCOUNTER — Encounter: Payer: Self-pay | Admitting: Internal Medicine

## 2022-07-20 LAB — LIPID PANEL
Chol/HDL Ratio: 2.2 ratio (ref 0.0–4.4)
Cholesterol, Total: 132 mg/dL (ref 100–199)
HDL: 60 mg/dL (ref >39)
LDL Chol Calc (NIH): 53 mg/dL (ref 0–99)
Triglycerides: 106 mg/dL (ref 0–149)
VLDL Cholesterol Cal: 19 mg/dL (ref 5–40)

## 2022-07-20 LAB — BASIC METABOLIC PANEL
BUN/Creatinine Ratio: 10 — ABNORMAL LOW (ref 12–28)
BUN: 8 mg/dL (ref 8–27)
CO2: 22 mmol/L (ref 20–29)
Calcium: 9.1 mg/dL (ref 8.7–10.3)
Chloride: 99 mmol/L (ref 96–106)
Creatinine, Ser: 0.82 mg/dL (ref 0.57–1.00)
Glucose: 98 mg/dL (ref 70–99)
Potassium: 3.6 mmol/L (ref 3.5–5.2)
Sodium: 138 mmol/L (ref 134–144)
eGFR: 75 mL/min/{1.73_m2} (ref >59)

## 2022-07-20 LAB — BRAIN NATRIURETIC PEPTIDE: BNP: 33.4 pg/mL (ref 0.0–100.0)

## 2022-07-22 ENCOUNTER — Ambulatory Visit (HOSPITAL_BASED_OUTPATIENT_CLINIC_OR_DEPARTMENT_OTHER)
Admission: RE | Admit: 2022-07-22 | Discharge: 2022-07-22 | Disposition: A | Discharge status: Home / Self Care | Payer: Medicare Other | Source: Ambulatory Visit | Attending: Internal Medicine | Admitting: Internal Medicine

## 2022-07-22 DIAGNOSIS — C3490 Malignant neoplasm of unspecified part of unspecified bronchus or lung: Secondary | ICD-10-CM | POA: Insufficient documentation

## 2022-07-22 MED ORDER — IOHEXOL 300 MG/ML  SOLN
100.0000 mL | Freq: Once | INTRAMUSCULAR | Status: AC | PRN
  Administered 2022-07-22: 100 mL via INTRAVENOUS

## 2022-07-22 NOTE — Progress Notes (Signed)
[facility] OFFICE PROGRESS NOTE  Patient, No Pcp Per No address on file  DIAGNOSIS: Stage IV (T1c, N2, M1 C) non-small cell lung cancer, adenocarcinoma with positive KRAS G12C mutation diagnosed in September 2023 and presented with left upper lobe lung mass in addition to left hilar and mediastinal lymphadenopathy as well as innumerable brain metastasis.    PDL1 Expression: 93%   PRIOR THERAPY:  SRS to multiple brain metastasis under the care of Dr. [name] on February 24, 2022   CURRENT THERAPY: Palliative systemic chemotherapy with carboplatin for AUC of 5, Alimta 500 Mg/M2 and Keytruda 200 Mg IV every 3 weeks. Status post 7 cycles. Starting from cycle #5, she started maintenance alimta and keytruda IV every 3 weeks   INTERVAL HISTORY: Kimberly Huang 72 y.o. female returns to the clinic today for a follow-up visit accompanied by her daughter.  The patient was last seen in the clinic 3 weeks ago by Dr. [name].  Though she was seen in the interval of the symptom management clinic for her persistent lower extremity swelling.  Her findings are not consistent with cellulitis.  She recently had a follow-up visit with her cardiologist.  Her BNP was normal.  She is expected to have an upcoming echocardiogram.  She was put on a salt restriction.  She is currently taking 20 mg of Lasix p.o. daily. She is wondering if she should continue this daily or PRN.   The patient is currently undergoing systemic treatment with chemotherapy and immunotherapy.  She has some controlled nausea with treatment. She may have an episode of vomiting but she states this is not continuous. She has zofran and compazine. Today she denies any fever, chills, or night sweats. She lost weight since last being seen but it may partially due to the improved swelling. She reports early satiety.  She denies dyspnea on exertion or cough. She denies any chest pain or hemoptysis.  She denies any diarrhea or constipation.  She  is prescribed tramadol by orthopedics for her chronic pain. She denies any headaches but has some watery eyes secondary to her Alimta. She also has had some mild blurred vision in her left eye which she was reportedly told was secondary to her history of metastatic disease to the brain. She recently had a repeat brain MRI and follow-up with Dr. [name] in February 2024 and they recommended a 45-month follow-up in May 2024.  She recently had a restaging CT scan of the chest, abdomen, pelvis.   She is here today for evaluation and to review her scan results before starting cycle #8.  MEDICAL HISTORY: Past Medical History:  Diagnosis Date   Arthritis    knee, hands   CAD S/P PCI OM1 99%-0%; Plan Staged PCI mRCA 90%. 02/19/2017   1. Severe 2 vessel obstructive CAD    - 99% thrombotic occlusion of first OM. This is a bifurcating vessel.     - 90% mid RCA-segmental 2. Good overall LV dysfunction with lateral HK 3. Moderately elevated LVEDP 4. Successful stenting of the first OM with DES. Distal embolization into the distal lateral OM branch.   Plan: DAPT for one year. Will treat with IV Aggrastat for 18 hours. Start high dose statin, beta blocker. IV Ntg for BP control acutely. Plan for stage PCI of RCA on Monday 26/94 if no complication.   Essential hypertension 02/21/2017   Hyperlipidemia with target LDL less than 70 02/21/2017   Lung cancer ([place])    Presence of  drug-eluting stent in L Cx: OM1 (Xience SIERRA DES 3.5 x 15) 02/21/2017   STEMI involving left circumflex coronary artery ([place]) 02/19/2017   Tobacco abuse 02/19/2017    ALLERGIES:  is allergic to codeine.  MEDICATIONS:  Current Outpatient Medications  Medication Sig Dispense Refill   acetaminophen (TYLENOL) 500 MG tablet Take 500 mg by mouth as needed for moderate pain.     aspirin 81 MG chewable tablet Chew 1 tablet (81 mg total) by mouth daily.     atorvastatin (LIPITOR) 80 MG tablet TAKE 1 TABLET BY MOUTH DAILY AT 6 PM. (Patient taking  differently: Take 80 mg by mouth daily.) 90 tablet 3   Coenzyme Q10 (CO Q 10) 100 MG CAPS Take 100 mg by mouth every evening.     diclofenac Sodium (VOLTAREN) 1 % GEL Apply 1 Application topically daily as needed (Knee pain).     ezetimibe (ZETIA) 10 MG tablet TAKE 1 TABLET BY MOUTH EVERY DAY 90 tablet 0   folic acid (FOLVITE) 1 MG tablet Take 1 tablet (1 mg total) by mouth daily. 90 tablet 1   furosemide (LASIX) 20 MG tablet Once daily only as needed for swelling of the lower extremities 30 tablet 6   irbesartan (AVAPRO) 150 MG tablet Take 1 tablet (150 mg total) by mouth daily. 90 tablet 3   levETIRAcetam (KEPPRA) 500 MG tablet TAKE 1 TABLET BY MOUTH TWICE A DAY 180 tablet 1   metoprolol tartrate (LOPRESSOR) 25 MG tablet TAKE 1 TABLET BY MOUTH TWICE A DAY 60 tablet 0   nitroGLYCERIN (NITROSTAT) 0.4 MG SL tablet Place 1 tablet (0.4 mg total) under the tongue every 5 (five) minutes x 3 doses as needed for chest pain. 25 tablet 2   ondansetron (ZOFRAN) 8 MG tablet Take 1 tablet (8 mg total) by mouth every 8 (eight) hours as needed for nausea or vomiting. Starting day 3 after chemotherapy (Patient taking differently: Take 8 mg by mouth as needed for nausea or vomiting.) 30 tablet 2   pantoprazole (PROTONIX) 40 MG tablet TAKE 1 TABLET BY MOUTH EVERY DAY 90 tablet 1   prochlorperazine (COMPAZINE) 10 MG tablet Take 1 tablet (10 mg total) by mouth every 6 (six) hours as needed. 30 tablet 2   traMADol (ULTRAM) 50 MG tablet Take 50 mg by mouth daily as needed for moderate pain.     No current facility-administered medications for this visit.    SURGICAL HISTORY:  Past Surgical History:  Procedure Laterality Date   BRONCHIAL BIOPSY  01/15/2022   Procedure: BRONCHIAL BIOPSIES;  Surgeon: [name], MD;  Location: [facility] ENDOSCOPY;  Service: Pulmonary;;   BRONCHIAL BRUSHINGS  01/15/2022   Procedure: BRONCHIAL BRUSHINGS;  Surgeon: [name], MD;  Location: [facility] ENDOSCOPY;  Service: Pulmonary;;    BRONCHIAL NEEDLE ASPIRATION BIOPSY  01/15/2022   Procedure: BRONCHIAL NEEDLE ASPIRATION BIOPSIES;  Surgeon: [name], MD;  Location: [facility] ENDOSCOPY;  Service: Pulmonary;;   CARDIAC CATHETERIZATION  02/21/2017   CHOLECYSTECTOMY     CORONARY STENT INTERVENTION N/A 02/19/2017   Procedure: CORONARY STENT INTERVENTION;  Surgeon: [name], Peter M, MD;  Location: [facility] CV LAB;  Service: Cardiovascular;  Laterality: N/A;   CORONARY STENT INTERVENTION N/A 02/21/2017   Procedure: CORONARY STENT INTERVENTION;  Surgeon: Belva Crome, MD;  Location: [facility] CV LAB;  Service: Cardiovascular;  Laterality: N/A;   FIDUCIAL MARKER PLACEMENT  01/15/2022   Procedure: FIDUCIAL MARKER PLACEMENT;  Surgeon: [name], MD;  Location: [facility]  ENDOSCOPY;  Service: Pulmonary;;   FINE NEEDLE ASPIRATION  01/15/2022   Procedure: FINE NEEDLE ASPIRATION;  Surgeon: [name], MD;  Location: [facility];  Service: Pulmonary;;   HEMOSTASIS CONTROL  01/15/2022   Procedure: HEMOSTASIS CONTROL;  Surgeon: [name], MD;  Location: [facility] ENDOSCOPY;  Service: Pulmonary;;   KNEE ARTHROSCOPY Right 2010   KNEE ARTHROSCOPY WITH SUBCHONDROPLASTY Left 01/09/2020   Procedure: Left knee arthroscopy,partial medial meniscectomy, debridement, subchondroplasty left proximal tibia;  Surgeon: Susa Day, MD;  Location: [name];  Service: Orthopedics;  Laterality: Left;   LEFT HEART CATH AND CORONARY ANGIOGRAPHY N/A 02/19/2017   Procedure: LEFT HEART CATH AND CORONARY ANGIOGRAPHY;  Surgeon: [name], Peter M, MD;  Location: [facility] CV LAB;  Service: Cardiovascular;  Laterality: N/A;   TONSILLECTOMY     TUBAL LIGATION     VIDEO BRONCHOSCOPY WITH ENDOBRONCHIAL ULTRASOUND N/A 01/15/2022   Procedure: VIDEO BRONCHOSCOPY WITH ENDOBRONCHIAL ULTRASOUND;  Surgeon: [name], MD;  Location: [facility] ENDOSCOPY;  Service: Pulmonary;  Laterality: N/A;   VIDEO BRONCHOSCOPY WITH RADIAL ENDOBRONCHIAL ULTRASOUND   01/15/2022   Procedure: RADIAL ENDOBRONCHIAL ULTRASOUND;  Surgeon: [name], MD;  Location: [facility] ENDOSCOPY;  Service: Pulmonary;;    REVIEW OF SYSTEMS:   Review of Systems  Constitutional: Positive for weight loss and diminished appetite. Negative for  chills and fever.  HENT: Negative for mouth sores, nosebleeds, sore throat and trouble swallowing.   Eyes: Positive for watery eyes. Negative for eye problems and icterus.  Respiratory: Negative for cough, hemoptysis, shortness of breath and wheezing.   Cardiovascular: Negative for chest pain and leg swelling.  Gastrointestinal: Positive for mild nausea/vomiting after treatment. Negative for abdominal pain, constipation, and diarrhea.  Genitourinary: Negative for bladder incontinence, difficulty urinating, dysuria, frequency and hematuria.   Musculoskeletal: Negative for back pain, gait problem, neck pain and neck stiffness.  Skin: Negative for itching and rash.  Neurological: Negative for dizziness, extremity weakness, gait problem, headaches, light-headedness and seizures.  Hematological: Negative for adenopathy. Does not bruise/bleed easily.  Psychiatric/Behavioral: Negative for confusion, depression and sleep disturbance. The patient is not nervous/anxious.     PHYSICAL EXAMINATION:  Blood pressure 116/71, pulse 91, temperature 98.3 F (36.8 C), temperature source Oral, resp. rate 18, height 5\' 2"  (1.575 m), weight 180 lb 11.2 oz (82 kg), SpO2 97 %.  ECOG PERFORMANCE STATUS: 2  Physical Exam  Constitutional: Oriented to person, place, and time and well-developed, well-nourished, and in no distress.  HENT:  Head: Normocephalic and atraumatic.  Mouth/Throat: Oropharynx is clear and moist. No oropharyngeal exudate.  Eyes: Conjunctivae are normal. Right eye exhibits no discharge. Left eye exhibits no discharge. No scleral icterus.  Neck: Normal range of motion. Neck supple.  Cardiovascular: Normal rate, regular rhythm, normal  heart sounds and intact distal pulses.   Pulmonary/Chest: Effort normal and breath sounds normal. No respiratory distress. No wheezes. No rales.  Abdominal: Soft. Bowel sounds are normal. Exhibits no distension and no mass. There is no tenderness.  Musculoskeletal: Normal range of motion. Exhibits no edema.  Lymphadenopathy:    No cervical adenopathy.  Neurological: Alert and oriented to person, place, and time. Exhibits muscle wasting. Examined in the wheelchair.  Skin: Skin is warm and dry. No rash noted. Not diaphoretic. No erythema. No pallor.  Psychiatric: Mood, memory and judgment normal.  Vitals reviewed.  LABORATORY DATA: Lab Results  Component Value Date   WBC 5.5 07/26/2022   HGB 11.2 (L) 07/26/2022   HCT 32.9 (L) 07/26/2022  MCV 100.9 (H) 07/26/2022   PLT 379 07/26/2022      Chemistry      Component Value Date/Time   NA 138 07/26/2022 1033   NA 138 07/19/2022 1620   K 3.4 (L) 07/26/2022 1033   CL 102 07/26/2022 1033   CO2 25 07/26/2022 1033   BUN 11 07/26/2022 1033   BUN 8 07/19/2022 1620   CREATININE 0.82 07/26/2022 1033      Component Value Date/Time   CALCIUM 9.0 07/26/2022 1033   ALKPHOS 81 07/26/2022 1033   AST 32 07/26/2022 1033   ALT 22 07/26/2022 1033   BILITOT 0.4 07/26/2022 1033       RADIOGRAPHIC STUDIES:  CT CHEST ABDOMEN PELVIS W CONTRAST  Result Date: 07/22/2022 CLINICAL DATA:  Non-small-cell lung cancer. Adenocarcinoma. Restaging. * Tracking Code: BO * EXAM: CT CHEST, ABDOMEN, AND PELVIS WITH CONTRAST TECHNIQUE: Multidetector CT imaging of the chest, abdomen and pelvis was performed following the standard protocol during bolus administration of intravenous contrast. RADIATION DOSE REDUCTION: This exam was performed according to the departmental dose-optimization program which includes automated exposure control, adjustment of the mA and/or kV according to patient size and/or use of iterative reconstruction technique. CONTRAST:  182mL OMNIPAQUE  IOHEXOL 300 MG/ML  SOLN COMPARISON:  05/12/2022 FINDINGS: CT CHEST FINDINGS Cardiovascular: Aortic atherosclerosis. Tortuous thoracic aorta. Borderline cardiomegaly. Three vessel coronary artery calcification. No central pulmonary embolism, on this non-dedicated study. Mediastinum/Nodes: No supraclavicular adenopathy. No axillary adenopathy. Calcified middle mediastinal nodes. No mediastinal adenopathy. A left infrahilar node measures 9 x 13 mm on 29/2 versus 8 x 8 mm on 30/2 of the prior exam. Lungs/Pleura: No pleural fluid. Mild centrilobular emphysema. Smoking related respiratory bronchiolitis. Posterolateral right upper lobe 3 mm pulmonary nodule on image 66/4 is unchanged. Right upper lobe calcified granuloma. 1-2 mm left apical pulmonary nodules are unchanged. 4 mm lingular nodule on 71/4 is unchanged. 2 mm calcified lingular granuloma. The posterior left upper lobe pulmonary nodule, contacting the left major fissure, measures 2.7 by 1.7 cm on 59/4 and is similar to the prior exam (when remeasured). Adjacent fiducial just inferomedially. Musculoskeletal: No acute osseous abnormality. T12 vertebral hemangioma. CT ABDOMEN PELVIS FINDINGS Hepatobiliary: Marked hepatic steatosis, mildly limiting evaluation for focal liver lesion. Not identified. Cholecystectomy, without biliary ductal dilatation. Pancreas: Normal, without mass or ductal dilatation. Spleen: Old granulomatous disease in the spleen.  No splenomegaly. Adrenals/Urinary Tract: Bilateral, left larger than right fluid density lesions of up to 6.3 cm are consistent with cysts . In the absence of clinically indicated signs/symptoms require(s) no independent follow-up. An interpolar left renal 7 mm lesion is similar in size to on the prior exam and too small to characterize. Left renal collecting system 11 mm nonobstructive calculus. Bilateral adrenal thickening and nodularity is greater left than right and similar. Consistent with adenomas on the 01/08/2022  PET. Normal urinary bladder. Stomach/Bowel: Gastric antral underdistention. Periampullary duodenal diverticulum. Otherwise normal small bowel. Normal colon, appendix, and terminal ileum. Vascular/Lymphatic: Advanced aortic and branch vessel atherosclerosis. No abdominopelvic adenopathy. Reproductive: Normal uterus and adnexa. Other: No significant free fluid. No evidence of omental or peritoneal disease. Musculoskeletal: Osteopenia. IMPRESSION: 1. Similar size of a posterior left upper lobe pulmonary nodule. 2. Developing left infrahilar adenopathy, suspicious for isolated nodal metastasis. This could either be re-evaluated on contrast-enhanced chest CT follow-up at 3 months or more entirely characterized with PET. 3. Otherwise, no evidence of metastatic disease. 4. Incidental findings, including: Hepatic steatosis. Left nephrolithiasis. Bilateral adrenal adenomas. Electronically Signed  By: Abigail Miyamoto M.D.   On: 07/22/2022 17:21     ASSESSMENT/PLAN:  This is a very pleasant 72 year old Caucasian female diagnosed with stage IV (T1c, N2, M1 C) non-small cell lung cancer, adenocarcinoma.  The patient presented with a left upper lobe lung mass in addition to left hilar mediastinal lymphadenopathy as well as innumerable brain metastases.  She was diagnosed in September 2023.   She is positive for K-ras G12 C mutation which can be targeted in the second line setting. Her PDL1 expression is 93%.   She underwent SRS to the multiple brain lesions on 02/24/2022.  She is currently undergoing palliative systemic chemotherapy with carboplatin for AUC of 5, Alimta 500 mg per metered square, Keytruda 200 mg IV every 3 weeks.  She is status post 7 cycles and tolerated it well except for controlled nausea/vomiting. Starting from cycle #5 she started maintenance alimta and [place].   The patient recently had a restaging CT scan performed.  The patient was seen with Dr. [name] today.  Dr. [name] personally and  independently reviewed the scan and discussed results with the patient today.  The scan showed no evidence for disease progression except for slight enlargement of an intra hilar lymph node which Dr. [name] recommends we monitor closely on subsequent imaging. Dr. [name] recommends she continue on the same treatment.   She will proceed with cycle #8 today scheduled  Will see the patient back for follow-up visit in 3 weeks for evaluation repeat blood work before undergoing cycle #9  Patient has improvement of her lower extremity swelling at this time.  Recommend that they continue to follow-up with cardiology regarding Lasix and whether she needs refills.  She is expected to have an echocardiogram performed soon.  The patient was advised to call immediately if she has any concerning symptoms in the interval. The patient voices understanding of current disease status and treatment options and is in agreement with the current care plan. All questions were answered. The patient knows to call the clinic with any problems, questions or concerns. We can certainly see the patient much sooner if necessary  No orders of the defined types were placed in this encounter.    Tyleek Smick L Aston Lieske, PA-C 07/26/22  ADDENDUM: Hematology/Oncology Attending:  I had a face-to-face encounter with the patient today.  I reviewed her record, lab, scan and recommended her care plan.  This is a very pleasant 72 years old white female with a stage IV non-small cell lung cancer, adenocarcinoma with positive KRAS G12C mutation and PD-L1 expression of 93%. The patient is status post SRS to multiple brain metastasis. She started palliative systemic chemotherapy with carboplatin, Alimta and Keytruda for 4 cycles.  She is currently on maintenance treatment with Alimta and Keytruda every 3 weeks status post 3 more cycles and she has been tolerating this treatment fairly well. She had repeat CT scan of the chest, abdomen  and pelvis performed recently.  I personally and independently reviewed the scan images and discussed the result with the patient and her daughter-in-law Kimberly Huang.  The patient has no clear evidence for disease progression except for increased soft tissue density in the left hilar area that need close monitoring. I recommended for her to continue her current maintenance treatment with Alimta and Keytruda as planned and she will proceed with cycle #8 today. We will see her back for follow-up visit in 3 weeks for evaluation before the next cycle of her treatment. The patient was advised to call  immediately if she has any other concerning symptoms in the interval. The total time spent in the appointment was 30 minutes. Disclaimer: This note was dictated with voice recognition software. Similar sounding words can inadvertently be transcribed and may be missed upon review. Eilleen Kempf, MD

## 2022-07-26 ENCOUNTER — Inpatient Hospital Stay: Payer: Medicare Other

## 2022-07-26 ENCOUNTER — Other Ambulatory Visit: Payer: Self-pay

## 2022-07-26 ENCOUNTER — Inpatient Hospital Stay (HOSPITAL_BASED_OUTPATIENT_CLINIC_OR_DEPARTMENT_OTHER): Payer: Medicare Other | Admitting: Physician Assistant

## 2022-07-26 VITALS — BP 116/71 | HR 91 | Temp 98.3°F | Resp 18 | Ht 62.0 in | Wt 180.7 lb

## 2022-07-26 DIAGNOSIS — C3492 Malignant neoplasm of unspecified part of left bronchus or lung: Secondary | ICD-10-CM

## 2022-07-26 DIAGNOSIS — Z5111 Encounter for antineoplastic chemotherapy: Secondary | ICD-10-CM | POA: Diagnosis not present

## 2022-07-26 DIAGNOSIS — Z5112 Encounter for antineoplastic immunotherapy: Secondary | ICD-10-CM

## 2022-07-26 LAB — CBC WITH DIFFERENTIAL (CANCER CENTER ONLY)
Abs Immature Granulocytes: 0.02 10*3/uL (ref 0.00–0.07)
Basophils Absolute: 0.1 10*3/uL (ref 0.0–0.1)
Basophils Relative: 1 %
Eosinophils Absolute: 0.1 10*3/uL (ref 0.0–0.5)
Eosinophils Relative: 2 %
HCT: 32.9 % — ABNORMAL LOW (ref 36.0–46.0)
Hemoglobin: 11.2 g/dL — ABNORMAL LOW (ref 12.0–15.0)
Immature Granulocytes: 0 %
Lymphocytes Relative: 24 %
Lymphs Abs: 1.3 10*3/uL (ref 0.7–4.0)
MCH: 34.4 pg — ABNORMAL HIGH (ref 26.0–34.0)
MCHC: 34 g/dL (ref 30.0–36.0)
MCV: 100.9 fL — ABNORMAL HIGH (ref 80.0–100.0)
Monocytes Absolute: 0.4 10*3/uL (ref 0.1–1.0)
Monocytes Relative: 8 %
Neutro Abs: 3.6 10*3/uL (ref 1.7–7.7)
Neutrophils Relative %: 65 %
Platelet Count: 379 10*3/uL (ref 150–400)
RBC: 3.26 MIL/uL — ABNORMAL LOW (ref 3.87–5.11)
RDW: 15.5 % (ref 11.5–15.5)
WBC Count: 5.5 10*3/uL (ref 4.0–10.5)
nRBC: 0 % (ref 0.0–0.2)

## 2022-07-26 LAB — CMP (CANCER CENTER ONLY)
ALT: 22 U/L (ref 0–44)
AST: 32 U/L (ref 15–41)
Albumin: 3.6 g/dL (ref 3.5–5.0)
Alkaline Phosphatase: 81 U/L (ref 38–126)
Anion gap: 11 (ref 5–15)
BUN: 11 mg/dL (ref 8–23)
CO2: 25 mmol/L (ref 22–32)
Calcium: 9 mg/dL (ref 8.9–10.3)
Chloride: 102 mmol/L (ref 98–111)
Creatinine: 0.82 mg/dL (ref 0.44–1.00)
GFR, Estimated: >60 mL/min (ref >60)
Glucose, Bld: 158 mg/dL — ABNORMAL HIGH (ref 70–99)
Potassium: 3.4 mmol/L — ABNORMAL LOW (ref 3.5–5.1)
Sodium: 138 mmol/L (ref 135–145)
Total Bilirubin: 0.4 mg/dL (ref 0.3–1.2)
Total Protein: 6.9 g/dL (ref 6.5–8.1)

## 2022-07-26 MED ORDER — SODIUM CHLORIDE 0.9 % IV SOLN: Freq: Once | INTRAVENOUS | Status: AC

## 2022-07-26 MED ORDER — SODIUM CHLORIDE 0.9 % IV SOLN
500.0000 mg/m2 | Freq: Once | INTRAVENOUS | Status: AC
  Administered 2022-07-26: 1000 mg via INTRAVENOUS
  Filled 2022-07-26: qty 40

## 2022-07-26 MED ORDER — PROCHLORPERAZINE MALEATE 10 MG PO TABS
10.0000 mg | ORAL_TABLET | Freq: Once | ORAL | Status: AC
  Administered 2022-07-26: 10 mg via ORAL
  Filled 2022-07-26: qty 1

## 2022-07-26 MED ORDER — SODIUM CHLORIDE 0.9 % IV SOLN
200.0000 mg | Freq: Once | INTRAVENOUS | Status: AC
  Administered 2022-07-26: 200 mg via INTRAVENOUS
  Filled 2022-07-26: qty 8

## 2022-07-26 NOTE — Patient Instructions (Signed)
[facility]  Discharge Instructions: Thank you for choosing [facility] to provide your oncology and hematology care.   If you have a lab appointment with the [facility], please go directly to the [facility] and check in at the registration area.   Wear comfortable clothing and clothing appropriate for easy access to any Portacath or PICC line.   We strive to give you quality time with your provider. You may need to reschedule your appointment if you arrive late (15 or more minutes).  Arriving late affects you and other patients whose appointments are after yours.  Also, if you miss three or more appointments without notifying the office, you may be dismissed from the clinic at the provider's discretion.      For prescription refill requests, have your pharmacy contact our office and allow 72 hours for refills to be completed.    Today you received the following chemotherapy and/or immunotherapy agents  :  Keytruda,  Pemetrexed.    To help prevent nausea and vomiting after your treatment, we encourage you to take your nausea medication as directed.  BELOW ARE SYMPTOMS THAT SHOULD BE REPORTED IMMEDIATELY: *FEVER GREATER THAN 100.4 F (38 C) OR HIGHER *CHILLS OR SWEATING *NAUSEA AND VOMITING THAT IS NOT CONTROLLED WITH YOUR NAUSEA MEDICATION *UNUSUAL SHORTNESS OF BREATH *UNUSUAL BRUISING OR BLEEDING *URINARY PROBLEMS (pain or burning when urinating, or frequent urination) *BOWEL PROBLEMS (unusual diarrhea, constipation, pain near the anus) TENDERNESS IN MOUTH AND THROAT WITH OR WITHOUT PRESENCE OF ULCERS (sore throat, sores in mouth, or a toothache) UNUSUAL RASH, SWELLING OR PAIN  UNUSUAL VAGINAL DISCHARGE OR ITCHING   Items with * indicate a potential emergency and should be followed up as soon as possible or go to the Emergency Department if any problems should occur.  Please show the CHEMOTHERAPY ALERT CARD or IMMUNOTHERAPY ALERT  CARD at check-in to the Emergency Department and triage nurse.  Should you have questions after your visit or need to cancel or reschedule your appointment, please contact [facility]  Dept: [phone]  and follow the prompts.  Office hours are 8:00 a.m. to 4:30 p.m. Monday - Friday. Please note that voicemails left after 4:00 p.m. may not be returned until the following business day.  We are closed weekends and major holidays. You have access to a nurse at all times for urgent questions. Please call the main number to the clinic Dept: [phone] and follow the prompts.   For any non-urgent questions, you may also contact your provider using MyChart. We now offer e-Visits for anyone 80 and older to request care online for non-urgent symptoms. For details visit [email or web address].   Also download the MyChart app! Go to the app store, search "MyChart", open the app, select [facility], and log in with your MyChart username and password.

## 2022-07-31 ENCOUNTER — Other Ambulatory Visit: Payer: Self-pay | Admitting: Cardiology

## 2022-08-03 ENCOUNTER — Encounter: Payer: Self-pay | Admitting: Internal Medicine

## 2022-08-03 ENCOUNTER — Telehealth: Payer: Self-pay | Admitting: *Deleted

## 2022-08-03 ENCOUNTER — Ambulatory Visit (HOSPITAL_COMMUNITY): Payer: Medicare Other | Attending: Cardiology

## 2022-08-03 DIAGNOSIS — C3491 Malignant neoplasm of unspecified part of right bronchus or lung: Secondary | ICD-10-CM

## 2022-08-03 DIAGNOSIS — I10 Essential (primary) hypertension: Secondary | ICD-10-CM

## 2022-08-03 DIAGNOSIS — E785 Hyperlipidemia, unspecified: Secondary | ICD-10-CM | POA: Diagnosis not present

## 2022-08-03 DIAGNOSIS — I2510 Atherosclerotic heart disease of native coronary artery without angina pectoris: Secondary | ICD-10-CM

## 2022-08-03 DIAGNOSIS — Z720 Tobacco use: Secondary | ICD-10-CM | POA: Diagnosis not present

## 2022-08-03 DIAGNOSIS — R609 Edema, unspecified: Secondary | ICD-10-CM

## 2022-08-03 LAB — ECHOCARDIOGRAM COMPLETE
Area-P 1/2: 3.42 cm2
Calc EF: 63.6 %
S' Lateral: 2.7 cm
Single Plane A2C EF: 57.9 %
Single Plane A4C EF: 62.2 %

## 2022-08-03 NOTE — Telephone Encounter (Signed)
Returned PC to patient's daughter, Metta Clines, no answer, left VM.  Informed Georgie patient's MRI is scheduled on 09/14/22 at 11:10 at [facility].  Patient also has MD appointment with Dr [name] on 09/16/22 at 11:30.  Instructed patient's daughter to call this office with any further questions/concerns, [phone].

## 2022-08-16 ENCOUNTER — Other Ambulatory Visit: Payer: Self-pay

## 2022-08-16 ENCOUNTER — Inpatient Hospital Stay: Payer: Medicare Other | Attending: Radiation Oncology

## 2022-08-16 ENCOUNTER — Inpatient Hospital Stay (HOSPITAL_BASED_OUTPATIENT_CLINIC_OR_DEPARTMENT_OTHER): Payer: Medicare Other | Admitting: Internal Medicine

## 2022-08-16 ENCOUNTER — Encounter: Payer: Self-pay | Admitting: Medical Oncology

## 2022-08-16 ENCOUNTER — Inpatient Hospital Stay: Payer: Medicare Other

## 2022-08-16 VITALS — BP 112/59 | HR 83 | Temp 98.5°F | Resp 17

## 2022-08-16 DIAGNOSIS — Z5112 Encounter for antineoplastic immunotherapy: Secondary | ICD-10-CM | POA: Diagnosis present

## 2022-08-16 DIAGNOSIS — C3412 Malignant neoplasm of upper lobe, left bronchus or lung: Secondary | ICD-10-CM | POA: Diagnosis present

## 2022-08-16 DIAGNOSIS — C3492 Malignant neoplasm of unspecified part of left bronchus or lung: Secondary | ICD-10-CM

## 2022-08-16 DIAGNOSIS — Z79899 Other long term (current) drug therapy: Secondary | ICD-10-CM | POA: Diagnosis not present

## 2022-08-16 DIAGNOSIS — Z5111 Encounter for antineoplastic chemotherapy: Secondary | ICD-10-CM | POA: Insufficient documentation

## 2022-08-16 DIAGNOSIS — C7931 Secondary malignant neoplasm of brain: Secondary | ICD-10-CM | POA: Insufficient documentation

## 2022-08-16 LAB — CBC WITH DIFFERENTIAL (CANCER CENTER ONLY)
Abs Immature Granulocytes: 0.03 10*3/uL (ref 0.00–0.07)
Basophils Absolute: 0.1 10*3/uL (ref 0.0–0.1)
Basophils Relative: 1 %
Eosinophils Absolute: 0.1 10*3/uL (ref 0.0–0.5)
Eosinophils Relative: 2 %
HCT: 35.5 % — ABNORMAL LOW (ref 36.0–46.0)
Hemoglobin: 12.3 g/dL (ref 12.0–15.0)
Immature Granulocytes: 1 %
Lymphocytes Relative: 24 %
Lymphs Abs: 1.6 10*3/uL (ref 0.7–4.0)
MCH: 34.8 pg — ABNORMAL HIGH (ref 26.0–34.0)
MCHC: 34.6 g/dL (ref 30.0–36.0)
MCV: 100.6 fL — ABNORMAL HIGH (ref 80.0–100.0)
Monocytes Absolute: 0.6 10*3/uL (ref 0.1–1.0)
Monocytes Relative: 9 %
Neutro Abs: 4.1 10*3/uL (ref 1.7–7.7)
Neutrophils Relative %: 63 %
Platelet Count: 313 10*3/uL (ref 150–400)
RBC: 3.53 MIL/uL — ABNORMAL LOW (ref 3.87–5.11)
RDW: 15.5 % (ref 11.5–15.5)
WBC Count: 6.5 10*3/uL (ref 4.0–10.5)
nRBC: 0 % (ref 0.0–0.2)

## 2022-08-16 LAB — CMP (CANCER CENTER ONLY)
ALT: 28 U/L (ref 0–44)
AST: 38 U/L (ref 15–41)
Albumin: 3.8 g/dL (ref 3.5–5.0)
Alkaline Phosphatase: 71 U/L (ref 38–126)
Anion gap: 8 (ref 5–15)
BUN: 18 mg/dL (ref 8–23)
CO2: 23 mmol/L (ref 22–32)
Calcium: 9.2 mg/dL (ref 8.9–10.3)
Chloride: 103 mmol/L (ref 98–111)
Creatinine: 0.83 mg/dL (ref 0.44–1.00)
GFR, Estimated: >60 mL/min (ref >60)
Glucose, Bld: 156 mg/dL — ABNORMAL HIGH (ref 70–99)
Potassium: 3.8 mmol/L (ref 3.5–5.1)
Sodium: 134 mmol/L — ABNORMAL LOW (ref 135–145)
Total Bilirubin: 0.5 mg/dL (ref 0.3–1.2)
Total Protein: 7.1 g/dL (ref 6.5–8.1)

## 2022-08-16 LAB — TSH: TSH: 1.526 u[IU]/mL (ref 0.350–4.500)

## 2022-08-16 MED ORDER — SODIUM CHLORIDE 0.9 % IV SOLN: Freq: Once | INTRAVENOUS | Status: AC

## 2022-08-16 MED ORDER — PROCHLORPERAZINE MALEATE 10 MG PO TABS
10.0000 mg | ORAL_TABLET | Freq: Once | ORAL | Status: AC
  Administered 2022-08-16: 10 mg via ORAL
  Filled 2022-08-16: qty 1

## 2022-08-16 MED ORDER — SODIUM CHLORIDE 0.9 % IV SOLN
200.0000 mg | Freq: Once | INTRAVENOUS | Status: AC
  Administered 2022-08-16: 200 mg via INTRAVENOUS
  Filled 2022-08-16: qty 200

## 2022-08-16 MED ORDER — CYANOCOBALAMIN 1000 MCG/ML IJ SOLN
1000.0000 ug | Freq: Once | INTRAMUSCULAR | Status: AC
  Administered 2022-08-16: 1000 ug via INTRAMUSCULAR
  Filled 2022-08-16: qty 1

## 2022-08-16 MED ORDER — SODIUM CHLORIDE 0.9 % IV SOLN
500.0000 mg/m2 | Freq: Once | INTRAVENOUS | Status: AC
  Administered 2022-08-16: 1000 mg via INTRAVENOUS
  Filled 2022-08-16: qty 40

## 2022-08-16 NOTE — Progress Notes (Addendum)
Patient seen by Dr. Mohamed  Vitals are within treatment parameters.  Labs reviewed: and are within treatment parameters.  Per physician team, patient is ready for treatment and there are NO modifications to the treatment plan.  

## 2022-08-16 NOTE — Progress Notes (Signed)
[facility] [phone]   [phone]  OFFICE PROGRESS NOTE  Patient, No Pcp Per No address on file  DIAGNOSIS: Stage IV (T1c, N2, M1 C) non-small cell lung cancer, adenocarcinoma with positive KRAS G12C mutation diagnosed in September 2023 and presented with left upper lobe lung mass in addition to left hilar and mediastinal lymphadenopathy as well as innumerable brain metastasis.  PRIOR THERAPY: SRS to multiple brain metastasis under the care of Dr. Basilio Cairo on February 24, 2022.  CURRENT THERAPY: Palliative systemic chemotherapy with carboplatin for AUC of 5, Alimta 500 Mg/M2 and Keytruda 200 Mg IV every 3 weeks.  Status post 8 cycles.  Starting from cycle #5 the patient will be on maintenance treatment with Alimta and Keytruda every 3 weeks.  INTERVAL HISTORY: Kimberly Huang 72 y.o. female returns to the clinic today for follow-up visit accompanied by her daughter-in-law Jabier Mutton.  The patient is feeling fine today with no concerning complaints except for constipation and bloating of her abdomen.  She denied having any current chest pain but has shortness of breath with exertion with no cough or hemoptysis.  She has no nausea, vomiting, diarrhea.  She has no headache or visual changes.  She denied having any fever or chills.  She is here today for evaluation before starting cycle #9 of her treatment.  MEDICAL HISTORY: Past Medical History:  Diagnosis Date   Arthritis    knee, hands   CAD S/P PCI OM1 99%-0%; Plan Staged PCI mRCA 90%. 02/19/2017   1. Severe 2 vessel obstructive CAD    - 99% thrombotic occlusion of first OM. This is a bifurcating vessel.     - 90% mid RCA-segmental 2. Good overall LV dysfunction with lateral HK 3. Moderately elevated LVEDP 4. Successful stenting of the first OM with DES. Distal embolization into the distal lateral OM branch.   Plan: DAPT for one year. Will treat with IV Aggrastat for 18 hours. Start high dose statin,  beta blocker. IV Ntg for BP control acutely. Plan for stage PCI of RCA on Monday 10/22 if no complication.   Essential hypertension 02/21/2017   Hyperlipidemia with target LDL less than 70 02/21/2017   Lung cancer (HCC)    Presence of drug-eluting stent in L Cx: OM1 (Xience SIERRA DES 3.5 x 15) 02/21/2017   STEMI involving left circumflex coronary artery (HCC) 02/19/2017   Tobacco abuse 02/19/2017    ALLERGIES:  is allergic to codeine.  MEDICATIONS:  Current Outpatient Medications  Medication Sig Dispense Refill   acetaminophen (TYLENOL) 500 MG tablet Take 500 mg by mouth as needed for moderate pain.     aspirin 81 MG chewable tablet Chew 1 tablet (81 mg total) by mouth daily.     atorvastatin (LIPITOR) 80 MG tablet TAKE 1 TABLET BY MOUTH DAILY AT 6 PM. 90 tablet 3   Coenzyme Q10 (CO Q 10) 100 MG CAPS Take 100 mg by mouth every evening.     diclofenac Sodium (VOLTAREN) 1 % GEL Apply 1 Application topically daily as needed (Knee pain).     ezetimibe (ZETIA) 10 MG tablet TAKE 1 TABLET BY MOUTH EVERY DAY 90 tablet 0   folic acid (FOLVITE) 1 MG tablet Take 1 tablet (1 mg total) by mouth daily. 90 tablet 1   furosemide (LASIX) 20 MG tablet Once daily only as needed for swelling of the lower extremities 30 tablet 6   irbesartan (AVAPRO) 150 MG tablet Take 1 tablet (150  mg total) by mouth daily. 90 tablet 3   levETIRAcetam (KEPPRA) 500 MG tablet TAKE 1 TABLET BY MOUTH TWICE A DAY 180 tablet 1   metoprolol tartrate (LOPRESSOR) 25 MG tablet TAKE 1 TABLET BY MOUTH TWICE A DAY 60 tablet 0   nitroGLYCERIN (NITROSTAT) 0.4 MG SL tablet Place 1 tablet (0.4 mg total) under the tongue every 5 (five) minutes x 3 doses as needed for chest pain. 25 tablet 2   ondansetron (ZOFRAN) 8 MG tablet Take 1 tablet (8 mg total) by mouth every 8 (eight) hours as needed for nausea or vomiting. Starting day 3 after chemotherapy (Patient taking differently: Take 8 mg by mouth as needed for nausea or vomiting.) 30 tablet 2    pantoprazole (PROTONIX) 40 MG tablet TAKE 1 TABLET BY MOUTH EVERY DAY 90 tablet 1   prochlorperazine (COMPAZINE) 10 MG tablet Take 1 tablet (10 mg total) by mouth every 6 (six) hours as needed. 30 tablet 2   traMADol (ULTRAM) 50 MG tablet Take 50 mg by mouth daily as needed for moderate pain.     No current facility-administered medications for this visit.    SURGICAL HISTORY:  Past Surgical History:  Procedure Laterality Date   BRONCHIAL BIOPSY  01/15/2022   Procedure: BRONCHIAL BIOPSIES;  Surgeon: Omar Person, MD;  Location: [facility] ENDOSCOPY;  Service: Pulmonary;;   BRONCHIAL BRUSHINGS  01/15/2022   Procedure: BRONCHIAL BRUSHINGS;  Surgeon: Omar Person, MD;  Location: [facility] ENDOSCOPY;  Service: Pulmonary;;   BRONCHIAL NEEDLE ASPIRATION BIOPSY  01/15/2022   Procedure: BRONCHIAL NEEDLE ASPIRATION BIOPSIES;  Surgeon: Omar Person, MD;  Location: [facility] ENDOSCOPY;  Service: Pulmonary;;   CARDIAC CATHETERIZATION  02/21/2017   CHOLECYSTECTOMY     CORONARY STENT INTERVENTION N/A 02/19/2017   Procedure: CORONARY STENT INTERVENTION;  Surgeon: [place], Peter M, MD;  Location: [facility] INVASIVE CV LAB;  Service: Cardiovascular;  Laterality: N/A;   CORONARY STENT INTERVENTION N/A 02/21/2017   Procedure: CORONARY STENT INTERVENTION;  Surgeon: Lyn Records, MD;  Location: [facility] INVASIVE CV LAB;  Service: Cardiovascular;  Laterality: N/A;   FIDUCIAL MARKER PLACEMENT  01/15/2022   Procedure: FIDUCIAL MARKER PLACEMENT;  Surgeon: Omar Person, MD;  Location: [facility] ENDOSCOPY;  Service: Pulmonary;;   FINE NEEDLE ASPIRATION  01/15/2022   Procedure: FINE NEEDLE ASPIRATION;  Surgeon: Omar Person, MD;  Location: [facility] ENDOSCOPY;  Service: Pulmonary;;   HEMOSTASIS CONTROL  01/15/2022   Procedure: HEMOSTASIS CONTROL;  Surgeon: Omar Person, MD;  Location: [facility] ENDOSCOPY;  Service: Pulmonary;;   KNEE ARTHROSCOPY Right 2010   KNEE ARTHROSCOPY WITH SUBCHONDROPLASTY Left 01/09/2020   Procedure: Left knee  arthroscopy,partial medial meniscectomy, debridement, subchondroplasty left proximal tibia;  Surgeon: Jene Every, MD;  Location: [name];  Service: Orthopedics;  Laterality: Left;   LEFT HEART CATH AND CORONARY ANGIOGRAPHY N/A 02/19/2017   Procedure: LEFT HEART CATH AND CORONARY ANGIOGRAPHY;  Surgeon: [place]azil[place], Peter M, MD;  Location: [facility] INVASIVE CV LAB;  Service: Cardiovascular;  Laterality: N/A;   TONSILLECTOMY     TUBAL LIGATION     VIDEO BRONCHOSCOPY WITH ENDOBRONCHIAL ULTRASOUND N/A 01/15/2022   Procedure: VIDEO BRONCHOSCOPY WITH ENDOBRONCHIAL ULTRASOUND;  Surgeon: Omar Person, MD;  Location: [facility] ENDOSCOPY;  Service: Pulmonary;  Laterality: N/A;   VIDEO BRONCHOSCOPY WITH RADIAL ENDOBRONCHIAL ULTRASOUND  01/15/2022   Procedure: RADIAL ENDOBRONCHIAL ULTRASOUND;  Surgeon: Omar Person, MD;  Location: [facility] ENDOSCOPY;  Service: Pulmonary;;    REVIEW OF SYSTEMS:  A comprehensive review of systems was negative  except for: Constitutional: positive for fatigue Gastrointestinal: positive for constipation   PHYSICAL EXAMINATION: General appearance: alert, cooperative, fatigued, and no distress Head: Normocephalic, without obvious abnormality, atraumatic Neck: no adenopathy, no JVD, supple, symmetrical, trachea midline, and thyroid not enlarged, symmetric, no tenderness/mass/nodules Lymph nodes: Cervical, supraclavicular, and axillary nodes normal. Resp: clear to auscultation bilaterally Back: symmetric, no curvature. ROM normal. No CVA tenderness. Cardio: regular rate and rhythm, S1, S2 normal, no murmur, click, rub or gallop GI: soft, non-tender; bowel sounds normal; no masses,  no organomegaly Extremities: edema trace edema bilateral  ECOG PERFORMANCE STATUS: 1 - Symptomatic but completely ambulatory  Blood pressure 121/78, pulse 84, temperature 98.2 F (36.8 C), temperature source Oral, resp. rate 15, weight 176 lb 1.6 oz (79.9 kg), SpO2 100 %.  LABORATORY DATA: Lab Results   Component Value Date   WBC 5.5 07/26/2022   HGB 11.2 (L) 07/26/2022   HCT 32.9 (L) 07/26/2022   MCV 100.9 (H) 07/26/2022   PLT 379 07/26/2022      Chemistry      Component Value Date/Time   NA 138 07/26/2022 1033   NA 138 07/19/2022 1620   K 3.4 (L) 07/26/2022 1033   CL 102 07/26/2022 1033   CO2 25 07/26/2022 1033   BUN 11 07/26/2022 1033   BUN 8 07/19/2022 1620   CREATININE 0.82 07/26/2022 1033      Component Value Date/Time   CALCIUM 9.0 07/26/2022 1033   ALKPHOS 81 07/26/2022 1033   AST 32 07/26/2022 1033   ALT 22 07/26/2022 1033   BILITOT 0.4 07/26/2022 1033       RADIOGRAPHIC STUDIES: ECHOCARDIOGRAM COMPLETE  Result Date: 08/03/2022    ECHOCARDIOGRAM REPORT   Patient Name:   [name] Date of Exam: 08/03/2022 Medical Rec #:  [id number]        Height:       62.0 in Accession #:    [id number]       Weight:       180.7 lb Date of Birth:  Sep 21, 1950         BSA:          1.831 m Patient Age:    71 years         BP:           116/71 mmHg Patient Gender: F                HR:           81 bpm. Exam Location:  [address] Procedure: 2D Echo, Cardiac Doppler and Color Doppler Indications:    Dyspnea R06.00  History:        Patient has no prior history of Echocardiogram examinations.                 Previous Myocardial Infarction and CAD; Risk                 Factors:Hypertension and Dyslipidemia.  Sonographer:    Thurman Coyer RDCS Referring Phys: [phone] Demetria Pore [place]  Sonographer Comments: 3D Heart Model was attempted. IMPRESSIONS  1. Left ventricular ejection fraction, by estimation, is 65 to 70%. The left ventricle has normal function. The left ventricle has no regional wall motion abnormalities. Left ventricular diastolic parameters are consistent with Grade I diastolic dysfunction (impaired relaxation).  2. Right ventricular systolic function is normal. The right ventricular size is normal. Tricuspid regurgitation signal is inadequate for assessing PA pressure.  3. The  mitral valve is  normal in structure. No evidence of mitral valve regurgitation. No evidence of mitral stenosis.  4. The aortic valve is normal in structure. Aortic valve regurgitation is not visualized. Aortic valve sclerosis/calcification is present, without any evidence of aortic stenosis.  5. The inferior vena cava is normal in size with greater than 50% respiratory variability, suggesting right atrial pressure of 3 mmHg. FINDINGS  Left Ventricle: Left ventricular ejection fraction, by estimation, is 65 to 70%. The left ventricle has normal function. The left ventricle has no regional wall motion abnormalities. The left ventricular internal cavity size was normal in size. There is  no left ventricular hypertrophy. Left ventricular diastolic parameters are consistent with Grade I diastolic dysfunction (impaired relaxation). Normal left ventricular filling pressure. Right Ventricle: The right ventricular size is normal. No increase in right ventricular wall thickness. Right ventricular systolic function is normal. Tricuspid regurgitation signal is inadequate for assessing PA pressure. Left Atrium: Left atrial size was normal in size. Right Atrium: Right atrial size was normal in size. Pericardium: There is no evidence of pericardial effusion. Mitral Valve: The mitral valve is normal in structure. No evidence of mitral valve regurgitation. No evidence of mitral valve stenosis. Tricuspid Valve: The tricuspid valve is normal in structure. Tricuspid valve regurgitation is trivial. No evidence of tricuspid stenosis. Aortic Valve: The aortic valve is normal in structure. Aortic valve regurgitation is not visualized. Aortic valve sclerosis/calcification is present, without any evidence of aortic stenosis. Pulmonic Valve: The pulmonic valve was normal in structure. Pulmonic valve regurgitation is not visualized. No evidence of pulmonic stenosis. Aorta: The aortic root is normal in size and structure. Venous: The inferior  vena cava is normal in size with greater than 50% respiratory variability, suggesting right atrial pressure of 3 mmHg. IAS/Shunts: No atrial level shunt detected by color flow Doppler.  LEFT VENTRICLE PLAX 2D LVIDd:         4.30 cm     Diastology LVIDs:         2.70 cm     LV e' medial:    5.11 cm/s LV PW:         1.00 cm     LV E/e' medial:  10.9 LV IVS:        0.90 cm     LV e' lateral:   5.22 cm/s LVOT diam:     2.00 cm     LV E/e' lateral: 10.7 LV SV:         78 LV SV Index:   43 LVOT Area:     3.14 cm  LV Volumes (MOD) LV vol d, MOD A2C: 48.9 ml LV vol d, MOD A4C: 80.9 ml LV vol s, MOD A2C: 20.6 ml LV vol s, MOD A4C: 30.6 ml LV SV MOD A2C:     28.3 ml LV SV MOD A4C:     80.9 ml LV SV MOD BP:      43.0 ml RIGHT VENTRICLE RV Basal diam:  3.70 cm RV Mid diam:    2.50 cm RV S prime:     10.30 cm/s TAPSE (M-mode): 3.0 cm LEFT ATRIUM           Index        RIGHT ATRIUM           Index LA diam:      2.80 cm 1.53 cm/m   RA Area:     14.30 cm LA Vol (A4C): 37.4 ml 20.43 ml/m  RA Volume:   31.60 ml  17.26 ml/m  AORTIC VALVE LVOT Vmax:   132.00 cm/s LVOT Vmean:  86.900 cm/s LVOT VTI:    0.249 m  AORTA Ao Root diam: 3.00 cm Ao Asc diam:  3.70 cm MITRAL VALVE MV Area (PHT): 3.42 cm     SHUNTS MV Decel Time: 222 msec     Systemic VTI:  0.25 m MV E velocity: 55.70 cm/s   Systemic Diam: 2.00 cm MV A velocity: 102.00 cm/s MV E/A ratio:  0.55 Armanda Magic MD Electronically signed by Armanda Magic MD Signature Date/Time: 08/03/2022/4:34:38 PM    Final    CT CHEST ABDOMEN PELVIS W CONTRAST  Result Date: 07/22/2022 CLINICAL DATA:  Non-small-cell lung cancer. Adenocarcinoma. Restaging. * Tracking Code: BO * EXAM: CT CHEST, ABDOMEN, AND PELVIS WITH CONTRAST TECHNIQUE: Multidetector CT imaging of the chest, abdomen and pelvis was performed following the standard protocol during bolus administration of intravenous contrast. RADIATION DOSE REDUCTION: This exam was performed according to the departmental dose-optimization program  which includes automated exposure control, adjustment of the mA and/or kV according to patient size and/or use of iterative reconstruction technique. CONTRAST:  OMNIPAQUE IOHEXOL 300 MG/ML  SOLN COMPARISON:  05/12/2022 FINDINGS: CT CHEST FINDINGS Cardiovascular: Aortic atherosclerosis. Tortuous thoracic aorta. Borderline cardiomegaly. Three vessel coronary artery calcification. No central pulmonary embolism, on this non-dedicated study. Mediastinum/Nodes: No supraclavicular adenopathy. No axillary adenopathy. Calcified middle mediastinal nodes. No mediastinal adenopathy. A left infrahilar node measures 9 x 13 mm on 29/2 versus 8 x 8 mm on 30/2 of the prior exam. Lungs/Pleura: No pleural fluid. Mild centrilobular emphysema. Smoking related respiratory bronchiolitis. Posterolateral right upper lobe 3 mm pulmonary nodule on image 66/4 is unchanged. Right upper lobe calcified granuloma. 1-2 mm left apical pulmonary nodules are unchanged. 4 mm lingular nodule on 71/4 is unchanged. 2 mm calcified lingular granuloma. The posterior left upper lobe pulmonary nodule, contacting the left major fissure, measures 2.7 by 1.7 cm on 59/4 and is similar to the prior exam (when remeasured). Adjacent fiducial just inferomedially. Musculoskeletal: No acute osseous abnormality. T12 vertebral hemangioma. CT ABDOMEN PELVIS FINDINGS Hepatobiliary: Marked hepatic steatosis, mildly limiting evaluation for focal liver lesion. Not identified. Cholecystectomy, without biliary ductal dilatation. Pancreas: Normal, without mass or ductal dilatation. Spleen: Old granulomatous disease in the spleen.  No splenomegaly. Adrenals/Urinary Tract: Bilateral, left larger than right fluid density lesions of up to 6.3 cm are consistent with cysts . In the absence of clinically indicated signs/symptoms require(s) no independent follow-up. An interpolar left renal 7 mm lesion is similar in size to on the prior exam and too small to characterize. Left  renal collecting system 11 mm nonobstructive calculus. Bilateral adrenal thickening and nodularity is greater left than right and similar. Consistent with adenomas on the 01/08/2022 PET. Normal urinary bladder. Stomach/Bowel: Gastric antral underdistention. Periampullary duodenal diverticulum. Otherwise normal small bowel. Normal colon, appendix, and terminal ileum. Vascular/Lymphatic: Advanced aortic and branch vessel atherosclerosis. No abdominopelvic adenopathy. Reproductive: Normal uterus and adnexa. Other: No significant free fluid. No evidence of omental or peritoneal disease. Musculoskeletal: Osteopenia. IMPRESSION: 1. Similar size of a posterior left upper lobe pulmonary nodule. 2. Developing left infrahilar adenopathy, suspicious for isolated nodal metastasis. This could either be re-evaluated on contrast-enhanced chest CT follow-up at 3 months or more entirely characterized with PET. 3. Otherwise, no evidence of metastatic disease. 4. Incidental findings, including: Hepatic steatosis. Left nephrolithiasis. Bilateral adrenal adenomas. Electronically Signed   By: Jeronimo Greaves M.D.   On: 07/22/2022 17:21    ASSESSMENT AND  PLAN: This is a very pleasant 72 years old white female with Stage IV (T1c, N2, M1 C) non-small cell lung cancer, adenocarcinoma with positive KRAS G12C mutation diagnosed in September 2023 and presented with left upper lobe lung mass in addition to left hilar and mediastinal lymphadenopathy as well as innumerable brain metastasis. The patient is scheduled for SRS to multiple brain metastasis on February 24, 2022. The patient is currently undergoing systemic chemotherapy with carboplatin for AUC of 5, Alimta 500 Mg/M2 and Keytruda 200 Mg IV every 3 weeks status post 8 cycles.  Starting from cycle #5 she is on maintenance treatment with Alimta and Keytruda every 3 weeks. The patient has been tolerating this treatment well with no concerning adverse effects except for mild fatigue. I  recommended for her to proceed with cycle #9 today as planned. I will see her back for follow-up visit in 3 weeks for evaluation before starting cycle #10. For the constipation, she will use MiraLAX and other laxatives for now. For the swelling of the lower extremity, this significantly improved with the treatment of Lasix. The patient was advised to call immediately if she has any other concerning symptoms in the interval. The patient voices understanding of current disease status and treatment options and is in agreement with the current care plan.  All questions were answered. The patient knows to call the clinic with any problems, questions or concerns. We can certainly see the patient much sooner if necessary.  The total time spent in the appointment was 30 minutes.  Disclaimer: This note was dictated with voice recognition software. Similar sounding words can inadvertently be transcribed and may not be corrected upon review.

## 2022-09-03 NOTE — Progress Notes (Unsigned)
[facility] OFFICE PROGRESS NOTE  Patient, No Pcp Per No address on file  DIAGNOSIS: Stage IV (T1c, N2, M1 C) non-small cell lung cancer, adenocarcinoma with positive KRAS G12C mutation diagnosed in September 2023 and presented with left upper lobe lung mass in addition to left hilar and mediastinal lymphadenopathy as well as innumerable brain metastasis.    PDL1 Expression: 93%   PRIOR THERAPY: SRS to multiple brain metastasis under the care of Dr. Basilio Cairo on February 24, 2022   CURRENT THERAPY:  Palliative systemic chemotherapy with carboplatin for AUC of 5, Alimta 500 Mg/M2 and Keytruda 200 Mg IV every 3 weeks. Status post 9 cycles. Starting from cycle #5, she started maintenance alimta and keytruda IV every 3 weeks   INTERVAL HISTORY: Kimberly Huang 72 y.o. female returns to the clinic today for a follow-up visit accompanied by her daughter-in-law.  The patient was last seen 3 weeks ago by Dr. Arbutus Ped.  She is currently undergoing maintenance chemotherapy and immunotherapy and she tolerates it fairly well except mild nausea. Today, she denies any fever, chills, or night sweats.  She lost a few pounds since last being seen because she has early satiety.  She eats a couple bites and then gets full.  She is drinking 2 protein supplemental drinks per day. She was previously struggling with swelling for which she is on salt restriction and was on Lasix.  She also had an echocardiogram.  Her swelling is significantly improved at this time.  She denies any chest pain or hemoptysis.  She denies any dyspnea on exertion or cough.  She had nausea and vomiting for 1 to 2 days after treatment.  She has Zofran and Compazine at home.  Denies any unusual diarrhea or constipation.    She follows closely with neuro-oncology regarding her history of metastatic disease to the brain.  She is prescribed tramadol for pain by her orthopedic physician for chronic pain.  She is here today for evaluation and  repeat blood work before undergoing cycle #10.  MEDICAL HISTORY: Past Medical History:  Diagnosis Date   Arthritis    knee, hands   CAD S/P PCI OM1 99%-0%; Plan Staged PCI mRCA 90%. 02/19/2017   1. Severe 2 vessel obstructive CAD    - 99% thrombotic occlusion of first OM. This is a bifurcating vessel.     - 90% mid RCA-segmental 2. Good overall LV dysfunction with lateral HK 3. Moderately elevated LVEDP 4. Successful stenting of the first OM with DES. Distal embolization into the distal lateral OM branch.   Plan: DAPT for one year. Will treat with IV Aggrastat for 18 hours. Start high dose statin, beta blocker. IV Ntg for BP control acutely. Plan for stage PCI of RCA on Monday 10/22 if no complication.   Essential hypertension 02/21/2017   Hyperlipidemia with target LDL less than 70 02/21/2017   Lung cancer (HCC)    Presence of drug-eluting stent in L Cx: OM1 (Xience SIERRA DES 3.5 x 15) 02/21/2017   STEMI involving left circumflex coronary artery (HCC) 02/19/2017   Tobacco abuse 02/19/2017    ALLERGIES:  is allergic to codeine.  MEDICATIONS:  Current Outpatient Medications  Medication Sig Dispense Refill   acetaminophen (TYLENOL) 500 MG tablet Take 500 mg by mouth as needed for moderate pain.     aspirin 81 MG chewable tablet Chew 1 tablet (81 mg total) by mouth daily.     atorvastatin (LIPITOR) 80 MG tablet TAKE 1 TABLET BY MOUTH  DAILY AT 6 PM. 90 tablet 3   Coenzyme Q10 (CO Q 10) 100 MG CAPS Take 100 mg by mouth every evening.     diclofenac Sodium (VOLTAREN) 1 % GEL Apply 1 Application topically daily as needed (Knee pain).     ezetimibe (ZETIA) 10 MG tablet TAKE 1 TABLET BY MOUTH EVERY DAY 90 tablet 0   folic acid (FOLVITE) 1 MG tablet Take 1 tablet (1 mg total) by mouth daily. 90 tablet 1   furosemide (LASIX) 20 MG tablet Once daily only as needed for swelling of the lower extremities 30 tablet 6   irbesartan (AVAPRO) 150 MG tablet Take 1 tablet (150 mg total) by mouth daily. 90  tablet 3   levETIRAcetam (KEPPRA) 500 MG tablet TAKE 1 TABLET BY MOUTH TWICE A DAY 180 tablet 1   metoprolol tartrate (LOPRESSOR) 25 MG tablet TAKE 1 TABLET BY MOUTH TWICE A DAY 60 tablet 0   nitroGLYCERIN (NITROSTAT) 0.4 MG SL tablet Place 1 tablet (0.4 mg total) under the tongue every 5 (five) minutes x 3 doses as needed for chest pain. 25 tablet 2   ondansetron (ZOFRAN) 8 MG tablet Take 1 tablet (8 mg total) by mouth every 8 (eight) hours as needed for nausea or vomiting. Starting day 3 after chemotherapy (Patient taking differently: Take 8 mg by mouth as needed for nausea or vomiting.) 30 tablet 2   pantoprazole (PROTONIX) 40 MG tablet TAKE 1 TABLET BY MOUTH EVERY DAY 90 tablet 1   prochlorperazine (COMPAZINE) 10 MG tablet Take 1 tablet (10 mg total) by mouth every 6 (six) hours as needed. 30 tablet 2   traMADol (ULTRAM) 50 MG tablet Take 50 mg by mouth daily as needed for moderate pain.     No current facility-administered medications for this visit.    SURGICAL HISTORY:  Past Surgical History:  Procedure Laterality Date   BRONCHIAL BIOPSY  01/15/2022   Procedure: BRONCHIAL BIOPSIES;  Surgeon: Omar Person, MD;  Location: [facility] ENDOSCOPY;  Service: Pulmonary;;   BRONCHIAL BRUSHINGS  01/15/2022   Procedure: BRONCHIAL BRUSHINGS;  Surgeon: Omar Person, MD;  Location: [facility] ENDOSCOPY;  Service: Pulmonary;;   BRONCHIAL NEEDLE ASPIRATION BIOPSY  01/15/2022   Procedure: BRONCHIAL NEEDLE ASPIRATION BIOPSIES;  Surgeon: Omar Person, MD;  Location: [facility] ENDOSCOPY;  Service: Pulmonary;;   CARDIAC CATHETERIZATION  02/21/2017   CHOLECYSTECTOMY     CORONARY STENT INTERVENTION N/A 02/19/2017   Procedure: CORONARY STENT INTERVENTION;  Surgeon: [place]az[place], Peter M, MD;  Location: [facility] INVASIVE CV LAB;  Service: Cardiovascular;  Laterality: N/A;   CORONARY STENT INTERVENTION N/A 02/21/2017   Procedure: CORONARY STENT INTERVENTION;  Surgeon: Lyn Records, MD;  Location: [facility] INVASIVE CV LAB;  Service:  Cardiovascular;  Laterality: N/A;   FIDUCIAL MARKER PLACEMENT  01/15/2022   Procedure: FIDUCIAL MARKER PLACEMENT;  Surgeon: Omar Person, MD;  Location: [facility] ENDOSCOPY;  Service: Pulmonary;;   FINE NEEDLE ASPIRATION  01/15/2022   Procedure: FINE NEEDLE ASPIRATION;  Surgeon: Omar Person, MD;  Location: [facility] ENDOSCOPY;  Service: Pulmonary;;   HEMOSTASIS CONTROL  01/15/2022   Procedure: HEMOSTASIS CONTROL;  Surgeon: Omar Person, MD;  Location: [facility] ENDOSCOPY;  Service: Pulmonary;;   KNEE ARTHROSCOPY Right 2010   KNEE ARTHROSCOPY WITH SUBCHONDROPLASTY Left 01/09/2020   Procedure: Left knee arthroscopy,partial medial meniscectomy, debridement, subchondroplasty left proximal tibia;  Surgeon: Jene Every, MD;  Location: [name];  Service: Orthopedics;  Laterality: Left;   LEFT HEART CATH AND CORONARY ANGIOGRAPHY N/A 02/19/2017  Procedure: LEFT HEART CATH AND CORONARY ANGIOGRAPHY;  Surgeon: [place]il[place], Peter M, MD;  Location: [facility] INVASIVE CV LAB;  Service: Cardiovascular;  Laterality: N/A;   TONSILLECTOMY     TUBAL LIGATION     VIDEO BRONCHOSCOPY WITH ENDOBRONCHIAL ULTRASOUND N/A 01/15/2022   Procedure: VIDEO BRONCHOSCOPY WITH ENDOBRONCHIAL ULTRASOUND;  Surgeon: Omar Person, MD;  Location: [facility] ENDOSCOPY;  Service: Pulmonary;  Laterality: N/A;   VIDEO BRONCHOSCOPY WITH RADIAL ENDOBRONCHIAL ULTRASOUND  01/15/2022   Procedure: RADIAL ENDOBRONCHIAL ULTRASOUND;  Surgeon: Omar Person, MD;  Location: [facility] ENDOSCOPY;  Service: Pulmonary;;    REVIEW OF SYSTEMS:   Review of Systems  Constitutional: Positive for weight loss, fatigue, and diminished appetite. Negative for chills and fever.  HENT: Negative for mouth sores, nosebleeds, sore throat and trouble swallowing.   Eyes: Positive for watery eyes. Negative for eye problems and icterus.  Respiratory: Negative for cough, hemoptysis, shortness of breath and wheezing.   Cardiovascular: Negative for chest pain and leg swelling.   Gastrointestinal: Positive for mild nausea/vomiting after treatment. Negative for abdominal pain, constipation, and diarrhea.  Genitourinary: Negative for bladder incontinence, difficulty urinating, dysuria, frequency and hematuria.   Musculoskeletal: Negative for back pain, gait problem, neck pain and neck stiffness.  Skin: Positive for dry skin.  Negative for itching and rash.  Neurological: Negative for dizziness, extremity weakness, gait problem, headaches, light-headedness and seizures.  Hematological: Negative for adenopathy. Does not bruise/bleed easily.  Psychiatric/Behavioral: Negative for confusion, depression and sleep disturbance. The patient is not nervous/anxious.     PHYSICAL EXAMINATION:  Blood pressure 103/65, pulse 97, temperature 98.5 F (36.9 C), temperature source Oral, resp. rate 16, weight 172 lb 14.4 oz (78.4 kg), SpO2 94 %.  ECOG PERFORMANCE STATUS: 2  Physical Exam  Constitutional: Oriented to person, place, and time and well-developed, well-nourished, and in no distress.  HENT:  Head: Normocephalic and atraumatic.  Mouth/Throat: Oropharynx is clear and moist. No oropharyngeal exudate.  Eyes: Conjunctivae are normal. Right eye exhibits no discharge. Left eye exhibits no discharge. No scleral icterus.  Neck: Normal range of motion. Neck supple.  Cardiovascular: Normal rate, regular rhythm, normal heart sounds and intact distal pulses.   Pulmonary/Chest: Effort normal and breath sounds normal. No respiratory distress. No wheezes. No rales.  Abdominal: Soft. Bowel sounds are normal. Exhibits no distension and no mass. There is no tenderness.  Musculoskeletal: Normal range of motion. Exhibits no edema.  Lymphadenopathy:    No cervical adenopathy.  Neurological: Alert and oriented to person, place, and time. Exhibits muscle wasting. Examined in the wheelchair.  Skin: Skin is warm and dry. No rash noted. Not diaphoretic. No erythema. No pallor.  Psychiatric: Mood,  memory and judgment normal.  Vitals reviewed.  LABORATORY DATA: Lab Results  Component Value Date   WBC 6.6 09/06/2022   HGB 12.1 09/06/2022   HCT 34.8 (L) 09/06/2022   MCV 100.3 (H) 09/06/2022   PLT 309 09/06/2022      Chemistry      Component Value Date/Time   NA 133 (L) 09/06/2022 0759   NA 138 07/19/2022 1620   K 3.5 09/06/2022 0759   CL 102 09/06/2022 0759   CO2 24 09/06/2022 0759   BUN 19 09/06/2022 0759   BUN 8 07/19/2022 1620   CREATININE 0.76 09/06/2022 0759      Component Value Date/Time   CALCIUM 8.9 09/06/2022 0759   ALKPHOS 60 09/06/2022 0759   AST 35 09/06/2022 0759   ALT 29 09/06/2022 0759   BILITOT  0.7 09/06/2022 0759       RADIOGRAPHIC STUDIES:  No results found.   ASSESSMENT/PLAN:  This is a very pleasant 72 year old Caucasian female diagnosed with stage IV (T1c, N2, M1 C) non-small cell lung cancer, adenocarcinoma.  The patient presented with a left upper lobe lung mass in addition to left hilar mediastinal lymphadenopathy as well as innumerable brain metastases.  She was diagnosed in September 2023.    She is positive for K-ras G12 C mutation which can be targeted in the second line setting. Her PDL1 expression is 93%.   She underwent SRS to the multiple brain lesions on 02/24/2022.   She is currently undergoing palliative systemic chemotherapy with carboplatin for AUC of 5, Alimta 500 mg per metered square, Keytruda 200 mg IV every 3 weeks.  She is status post 9 cycles and tolerated it well except for controlled nausea/vomiting. Starting from cycle #5 she started maintenance alimta and [name].    She will proceed with cycle #10 today scheduled   Will see the patient back for follow-up visit in 3 weeks for evaluation repeat blood work before undergoing cycle #11  Will arrange for restaging CT scan of the chest, abdomen, pelvis prior to her next cycle of treatment.  She received Compazine and her premedications.  If she experiences nausea or  vomiting today of and the day after chemotherapy, I advised her to take Zofran later today upon returning home.  I also instructed her to take an antiemetic for Singh in the morning tomorrow to stay ahead of her nausea and vomiting.  The patient was advised to call immediately if she has any concerning symptoms in the interval. The patient voices understanding of current disease status and treatment options and is in agreement with the current care plan. All questions were answered. The patient knows to call the clinic with any problems, questions or concerns. We can certainly see the patient much sooner if necessary       Orders Placed This Encounter  Procedures   CT CHEST ABDOMEN PELVIS W CONTRAST    Standing Status:   Future    Standing Expiration Date:   09/06/2023    Order Specific Question:   If indicated for the ordered procedure, I authorize the administration of contrast media per Radiology protocol    Answer:   Yes    Order Specific Question:   Does the patient have a contrast media/X-ray dye allergy?    Answer:   No    Order Specific Question:   Preferred imaging location?    Answer:   [facility]    Order Specific Question:   If indicated for the ordered procedure, I authorize the administration of oral contrast media per Radiology protocol    Answer:   Yes     The total time spent in the appointment was 20-29 minutes  Rashun Grattan L Maryna Yeagle, PA-C 09/06/22

## 2022-09-04 NOTE — Progress Notes (Deleted)
Cardiology Office Note    Date:  09/04/2022   ID:  Kimberly Huang, DOB 1950-07-03, MRN [id number]  PCP:  Patient, No Pcp Per  Cardiologist:  Dr. Swaziland  No chief complaint on file.   History of Present Illness:  Kimberly Huang is a 72 y.o. female with past medical history of hypertension, hyperlipidemia, tobacco abuse and CAD.  Patient presented in October 2018 with chest pain and a posterior STEMI.  She ultimately underwent DES to her left circumflex, she also had residual RCA disease and that was intervened 48 hours later.  Ejection fraction was preserved on LV gram.    In September 2021 she underwent arthroscopic left knee surgery.   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 She had SSR for brain met. On therapy with Keytruda and alimta.  She is experiencing significant LE edema. She has been taking lasix occasionally but concerned about BP getting too low. She is due to have 3 more rounds of chemo. She denies any chest pain. States she has almost quit smoking.   We performed Echo which was normal. Prescribed lasix 20 mg daily. Irbesartan reduced due to low BP  Past Medical History:  Diagnosis Date   Arthritis    knee, hands   CAD S/P PCI OM1 99%-0%; Plan Staged PCI mRCA 90%. 02/19/2017   1. Severe 2 vessel obstructive CAD    - 99% thrombotic occlusion of first OM. This is a bifurcating vessel.     - 90% mid RCA-segmental 2. Good overall LV dysfunction with lateral HK 3. Moderately elevated LVEDP 4. Successful stenting of the first OM with DES. Distal embolization into the distal lateral OM branch.   Plan: DAPT for one year. Will treat with IV Aggrastat for 18 hours. Start high dose statin, beta blocker. IV Ntg for BP control acutely. Plan for stage PCI of RCA on Monday 10/22 if no complication.   Essential hypertension 02/21/2017   Hyperlipidemia with target LDL less than 70 02/21/2017   Lung cancer (HCC)    Presence of  drug-eluting stent in L Cx: OM1 (Xience SIERRA DES 3.5 x 15) 02/21/2017   STEMI involving left circumflex coronary artery (HCC) 02/19/2017   Tobacco abuse 02/19/2017    Past Surgical History:  Procedure Laterality Date   BRONCHIAL BIOPSY  01/15/2022   Procedure: BRONCHIAL BIOPSIES;  Surgeon: Omar Person, MD;  Location: [facility] ENDOSCOPY;  Service: Pulmonary;;   BRONCHIAL BRUSHINGS  01/15/2022   Procedure: BRONCHIAL BRUSHINGS;  Surgeon: Omar Person, MD;  Location[facility] ENDOSCOPY;  Service: Pulmonary;;   BRONCHIAL NEEDLE ASPIRATION BIOPSY  01/15/2022   Procedure: BRONCHIAL NEEDLE ASPIRATION BIOPSIES;  Surgeon: Omar Person, MD;  Location: [facility] ENDOSCOPY;  Service: Pulmonary;;   CARDIAC CATHETERIZATION  02/21/2017   CHOLECYSTECTOMY     CORONARY STENT INTERVENTION N/A 02/19/2017   Procedure: CORONARY STENT INTERVENTION;  Surgeon: Swaziland, Viviana Trimble M, MD;  Location: [facility] INVASIVE CV LAB;  Service: Cardiovascular;  Laterality: N/A;   CORONARY STENT INTERVENTION N/A 02/21/2017   Procedure: CORONARY STENT INTERVENTION;  Surgeon: Lyn Records, MD;  Location: [facility] INVASIVE CV LAB;  Service: Cardiovascular;  Laterality: N/A;   FIDUCIAL MARKER PLACEMENT  01/15/2022   Procedure: FIDUCIAL MARKER PLACEMENT;  Surgeon: Omar Person, MD;  Location: [facility] ENDOSCOPY;  Service: Pulmonary;;   FINE NEEDLE ASPIRATION  01/15/2022   Procedure: FINE NEEDLE ASPIRATION;  Surgeon: Omar Person, MD;  Location: [facility] ENDOSCOPY;  Service: Pulmonary;;   HEMOSTASIS CONTROL  01/15/2022   Procedure: HEMOSTASIS CONTROL;  Surgeon: Omar Person, MD;  Location: [facility] ENDOSCOPY;  Service: Pulmonary;;   KNEE ARTHROSCOPY Right 2010   KNEE ARTHROSCOPY WITH SUBCHONDROPLASTY Left 01/09/2020   Procedure: Left knee arthroscopy,partial medial meniscectomy, debridement, subchondroplasty left proximal tibia;  Surgeon: Jene Every, MD;  Location: [name];  Service: Orthopedics;  Laterality: Left;   LEFT HEART CATH AND CORONARY  ANGIOGRAPHY N/A 02/19/2017   Procedure: LEFT HEART CATH AND CORONARY ANGIOGRAPHY;  Surgeon: Swaziland, Lyndee Herbst M, MD;  Location: [facility] INVASIVE CV LAB;  Service: Cardiovascular;  Laterality: N/A;   TONSILLECTOMY     TUBAL LIGATION     VIDEO BRONCHOSCOPY WITH ENDOBRONCHIAL ULTRASOUND N/A 01/15/2022   Procedure: VIDEO BRONCHOSCOPY WITH ENDOBRONCHIAL ULTRASOUND;  Surgeon: Omar Person, MD;  Location: [facility] ENDOSCOPY;  Service: Pulmonary;  Laterality: N/A;   VIDEO BRONCHOSCOPY WITH RADIAL ENDOBRONCHIAL ULTRASOUND  01/15/2022   Procedure: RADIAL ENDOBRONCHIAL ULTRASOUND;  Surgeon: Omar Person, MD;  Location: [facility] ENDOSCOPY;  Service: Pulmonary;;    Current Medications: Outpatient Medications Prior to Visit  Medication Sig Dispense Refill   acetaminophen (TYLENOL) 500 MG tablet Take 500 mg by mouth as needed for moderate pain.     aspirin 81 MG chewable tablet Chew 1 tablet (81 mg total) by mouth daily.     atorvastatin (LIPITOR) 80 MG tablet TAKE 1 TABLET BY MOUTH DAILY AT 6 PM. 90 tablet 3   Coenzyme Q10 (CO Q 10) 100 MG CAPS Take 100 mg by mouth every evening.     diclofenac Sodium (VOLTAREN) 1 % GEL Apply 1 Application topically daily as needed (Knee pain).     ezetimibe (ZETIA) 10 MG tablet TAKE 1 TABLET BY MOUTH EVERY DAY 90 tablet 0   folic acid (FOLVITE) 1 MG tablet Take 1 tablet (1 mg total) by mouth daily. 90 tablet 1   furosemide (LASIX) 20 MG tablet Once daily only as needed for swelling of the lower extremities 30 tablet 6   irbesartan (AVAPRO) 150 MG tablet Take 1 tablet (150 mg total) by mouth daily. 90 tablet 3   levETIRAcetam (KEPPRA) 500 MG tablet TAKE 1 TABLET BY MOUTH TWICE A DAY 180 tablet 1   metoprolol tartrate (LOPRESSOR) 25 MG tablet TAKE 1 TABLET BY MOUTH TWICE A DAY 60 tablet 0   nitroGLYCERIN (NITROSTAT) 0.4 MG SL tablet Place 1 tablet (0.4 mg total) under the tongue every 5 (five) minutes x 3 doses as needed for chest pain. 25 tablet 2   ondansetron (ZOFRAN) 8 MG tablet  Take 1 tablet (8 mg total) by mouth every 8 (eight) hours as needed for nausea or vomiting. Starting day 3 after chemotherapy (Patient taking differently: Take 8 mg by mouth as needed for nausea or vomiting.) 30 tablet 2   pantoprazole (PROTONIX) 40 MG tablet TAKE 1 TABLET BY MOUTH EVERY DAY 90 tablet 1   prochlorperazine (COMPAZINE) 10 MG tablet Take 1 tablet (10 mg total) by mouth every 6 (six) hours as needed. 30 tablet 2   traMADol (ULTRAM) 50 MG tablet Take 50 mg by mouth daily as needed for moderate pain.     No facility-administered medications prior to visit.     Allergies:   Codeine   Social History   Socioeconomic History   Marital status: Married    Spouse name: Not on file   Number of children: 3   Years of education: Not on file   Highest education level: Not  on file  Occupational History   Not on file  Tobacco Use   Smoking status: Some Days    Packs/day: 1.50    Years: 50.00    Additional pack years: 0.00    Total pack years: 75.00    Types: Cigarettes   Smokeless tobacco: Never   Tobacco comments:    Trying to quit, currently smokes 5-6 cigarettes per day 01/21/2022  Vaping Use   Vaping Use: Never used  Substance and Sexual Activity   Alcohol use: Yes    Comment: occasional   Drug use: Never   Sexual activity: Not Currently    Birth control/protection: Surgical    Comment: Hysterectomy//Tubal Ligation  Other Topics Concern   Not on file  Social History Narrative   Family runs the [employer].   Social Determinants of Health   Financial Resource Strain: Not on file  Food Insecurity: No Food Insecurity (03/22/2022)   Hunger Vital Sign    Worried About Running Out of Food in the Last Year: Never true    Ran Out of Food in the Last Year: Never true  Transportation Needs: No Transportation Needs (03/22/2022)   PRAPARE - Administrator, Civil Service (Medical): No    Lack of Transportation (Non-Medical): No  Physical Activity: Not on  file  Stress: Not on file  Social Connections: Not on file     Family History:  The patient's family history includes Diabetes in her mother; Heart attack in her sister; Lung cancer in her father.   ROS:   Please see the history of present illness.    ROS All other systems reviewed and are negative.   PHYSICAL EXAM:   VS:  There were no vitals taken for this visit.   GENERAL:  Well appearing, overweight,,  WF in NAD HEENT:  PERRL, EOMI, sclera are clear. Oropharynx is clear. NECK:  No jugular venous distention, carotid upstroke brisk and symmetric, no bruits, no thyromegaly or adenopathy LUNGS:  Clear to auscultation bilaterally CHEST:  Unremarkable HEART:  RRR,  PMI not displaced or sustained,S1 and S2 within normal limits, no S3, no S4: no clicks, no rubs, no murmurs ABD:  Soft, nontender. BS +, no masses or bruits. No hepatomegaly, no splenomegaly EXT:  2 + pulses throughout,2+ edema, skin in legs dry and scaly SKIN:  Warm and dry.  No rashes NEURO:  Alert and oriented x 3. Cranial nerves II through XII intact. PSYCH:  Cognitively intact    Wt Readings from Last 3 Encounters:  08/16/22 176 lb 1.6 oz (79.9 kg)  07/26/22 180 lb 11.2 oz (82 kg)  07/19/22 189 lb (85.7 kg)      Studies/Labs Reviewed:   EKG:  EKG is not ordered today.     Recent Labs: 07/19/2022: BNP 33.4 08/16/2022: ALT 28; BUN 18; Creatinine 0.83; Hemoglobin 12.3; Platelet Count 313; Potassium 3.8; Sodium 134; TSH 1.526   Lipid Panel    Component Value Date/Time   CHOL 132 07/19/2022 1620   TRIG 106 07/19/2022 1620   HDL 60 07/19/2022 1620   CHOLHDL 2.2 07/19/2022 1620   CHOLHDL 4.0 02/20/2017 0524   VLDL 16 02/20/2017 0524   LDLCALC 53 07/19/2022 1620    Additional studies/ records that were reviewed today include:   Cath 02/19/2017 Conclusion     Prox LAD to Mid LAD lesion, 20 %stenosed. Prox RCA to Mid RCA lesion, 90 %stenosed. Prox Cx to Mid Cx lesion, 30 %stenosed. The left  ventricular systolic  function is normal. LV end diastolic pressure is moderately elevated. The left ventricular ejection fraction is 50-55% by visual estimate. 1st Mrg lesion, 99 %stenosed. A STENT SIERRA 3.50 X 15 MM drug eluting stent was successfully placed. Post intervention, there is a 0% residual stenosis. Lat 1st Mrg lesion, 100 %stenosed.   1. Severe 2 vessel obstructive CAD    - 99% thrombotic occlusion of first OM. This is a bifurcating vessel.     - 90% mid RCA-segmental 2. Good overall LV dysfunction with lateral HK 3. Moderately elevated LVEDP 4. Successful stenting of the first OM with DES. Distal embolization into the distal lateral OM branch.   Plan: DAPT for one year. Will treat with IV Aggrastat for 18 hours. Start high dose statin, beta blocker. IV Ntg for BP control acutely. Plan for stage PCI of RCA on Monday if no complication.      Cath 02/21/2017 Conclusion   Successful stent implantation in the proximal to mid RCA reducing a segmental 90% stenosis to 0% with TIMI grade 3 flow. Stent used was a 3.0 x 26 Onyx post dilated to 14 atm 2.   RECOMMENDATIONS:   Continue dual antiplatelet therapy (aspirin and Brilinta) Discharge per discretion of IC team.     Echo 08/03/22: IMPRESSIONS     1. Left ventricular ejection fraction, by estimation, is 65 to 70%. The  left ventricle has normal function. The left ventricle has no regional  wall motion abnormalities. Left ventricular diastolic parameters are  consistent with Grade I diastolic  dysfunction (impaired relaxation).   2. Right ventricular systolic function is normal. The right ventricular  size is normal. Tricuspid regurgitation signal is inadequate for assessing  PA pressure.   3. The mitral valve is normal in structure. No evidence of mitral valve  regurgitation. No evidence of mitral stenosis.   4. The aortic valve is normal in structure. Aortic valve regurgitation is  not visualized. Aortic valve  sclerosis/calcification is present, without  any evidence of aortic stenosis.   5. The inferior vena cava is normal in size with greater than 50%  respiratory variability, suggesting right atrial pressure of 3 mmHg.    ASSESSMENT:    No diagnosis found.    PLAN:  In order of problems listed above:  CAD: s/p STEMI in October 2018 with DES the LCx and RCA. She is asymptomatic. Continue ASA, lipitor, beta blocker.   Hypertension: Blood pressure is  controlled. Concerned about low BP with diuretics. Will reduce irbesartan to 150 mg daily. Continue current meds  Hyperlipidemia: on high dose lipitor and Zetia. Will update labs today.   Tobacco abuse:  Counseled on complete cessation.   5.   Stage IV small cell lung CA with brain mets.   6.   LE edema. Likely related to chemotherapy. Will check BNP level. Update Echo. Recommend she take lasix 20 mg daily. Restrict sodium intake. Compression hose.     Medication Adjustments/Labs and Tests Ordered: Current medicines are reviewed at length with the patient today.  Concerns regarding medicines are outlined above.  Medication changes, Labs and Tests ordered today are listed in the Patient Instructions below. There are no Patient Instructions on file for this visit.   Follow up in 2 months.  Signed, Mahogani Holohan Swaziland, MD  09/04/2022 9:17 AM    [facility] [address] [place], [place], [place]  [address] Phone: [phone]; Fax: [phone]

## 2022-09-06 ENCOUNTER — Inpatient Hospital Stay: Payer: Medicare Other

## 2022-09-06 ENCOUNTER — Inpatient Hospital Stay: Payer: Medicare Other | Attending: Radiation Oncology

## 2022-09-06 ENCOUNTER — Other Ambulatory Visit: Payer: Self-pay

## 2022-09-06 ENCOUNTER — Inpatient Hospital Stay: Payer: Medicare Other | Admitting: Physician Assistant

## 2022-09-06 VITALS — BP 103/65 | HR 97 | Temp 98.5°F | Resp 16 | Wt 172.9 lb

## 2022-09-06 VITALS — BP 112/60 | HR 90 | Resp 18

## 2022-09-06 DIAGNOSIS — C3412 Malignant neoplasm of upper lobe, left bronchus or lung: Secondary | ICD-10-CM | POA: Diagnosis present

## 2022-09-06 DIAGNOSIS — Z5111 Encounter for antineoplastic chemotherapy: Secondary | ICD-10-CM | POA: Diagnosis not present

## 2022-09-06 DIAGNOSIS — Z79899 Other long term (current) drug therapy: Secondary | ICD-10-CM | POA: Diagnosis not present

## 2022-09-06 DIAGNOSIS — C7931 Secondary malignant neoplasm of brain: Secondary | ICD-10-CM | POA: Insufficient documentation

## 2022-09-06 DIAGNOSIS — C3492 Malignant neoplasm of unspecified part of left bronchus or lung: Secondary | ICD-10-CM | POA: Diagnosis not present

## 2022-09-06 DIAGNOSIS — Z5112 Encounter for antineoplastic immunotherapy: Secondary | ICD-10-CM

## 2022-09-06 LAB — CBC WITH DIFFERENTIAL (CANCER CENTER ONLY)
Abs Immature Granulocytes: 0.02 10*3/uL (ref 0.00–0.07)
Basophils Absolute: 0 10*3/uL (ref 0.0–0.1)
Basophils Relative: 0 %
Eosinophils Absolute: 0.1 10*3/uL (ref 0.0–0.5)
Eosinophils Relative: 1 %
HCT: 34.8 % — ABNORMAL LOW (ref 36.0–46.0)
Hemoglobin: 12.1 g/dL (ref 12.0–15.0)
Immature Granulocytes: 0 %
Lymphocytes Relative: 25 %
Lymphs Abs: 1.6 10*3/uL (ref 0.7–4.0)
MCH: 34.9 pg — ABNORMAL HIGH (ref 26.0–34.0)
MCHC: 34.8 g/dL (ref 30.0–36.0)
MCV: 100.3 fL — ABNORMAL HIGH (ref 80.0–100.0)
Monocytes Absolute: 0.7 10*3/uL (ref 0.1–1.0)
Monocytes Relative: 10 %
Neutro Abs: 4.1 10*3/uL (ref 1.7–7.7)
Neutrophils Relative %: 64 %
Platelet Count: 309 10*3/uL (ref 150–400)
RBC: 3.47 MIL/uL — ABNORMAL LOW (ref 3.87–5.11)
RDW: 15 % (ref 11.5–15.5)
WBC Count: 6.6 10*3/uL (ref 4.0–10.5)
nRBC: 0 % (ref 0.0–0.2)

## 2022-09-06 LAB — CMP (CANCER CENTER ONLY)
ALT: 29 U/L (ref 0–44)
AST: 35 U/L (ref 15–41)
Albumin: 3.7 g/dL (ref 3.5–5.0)
Alkaline Phosphatase: 60 U/L (ref 38–126)
Anion gap: 7 (ref 5–15)
BUN: 19 mg/dL (ref 8–23)
CO2: 24 mmol/L (ref 22–32)
Calcium: 8.9 mg/dL (ref 8.9–10.3)
Chloride: 102 mmol/L (ref 98–111)
Creatinine: 0.76 mg/dL (ref 0.44–1.00)
GFR, Estimated: >60 mL/min (ref >60)
Glucose, Bld: 136 mg/dL — ABNORMAL HIGH (ref 70–99)
Potassium: 3.5 mmol/L (ref 3.5–5.1)
Sodium: 133 mmol/L — ABNORMAL LOW (ref 135–145)
Total Bilirubin: 0.7 mg/dL (ref 0.3–1.2)
Total Protein: 6.7 g/dL (ref 6.5–8.1)

## 2022-09-06 MED ORDER — SODIUM CHLORIDE 0.9 % IV SOLN
200.0000 mg | Freq: Once | INTRAVENOUS | Status: AC
  Administered 2022-09-06: 200 mg via INTRAVENOUS
  Filled 2022-09-06: qty 200

## 2022-09-06 MED ORDER — SODIUM CHLORIDE 0.9 % IV SOLN: Freq: Once | INTRAVENOUS | Status: AC

## 2022-09-06 MED ORDER — SODIUM CHLORIDE 0.9 % IV SOLN
500.0000 mg/m2 | Freq: Once | INTRAVENOUS | Status: AC
  Administered 2022-09-06: 1000 mg via INTRAVENOUS
  Filled 2022-09-06: qty 40

## 2022-09-06 MED ORDER — PROCHLORPERAZINE MALEATE 10 MG PO TABS
10.0000 mg | ORAL_TABLET | Freq: Once | ORAL | Status: AC
  Administered 2022-09-06: 10 mg via ORAL
  Filled 2022-09-06: qty 1

## 2022-09-06 NOTE — Patient Instructions (Signed)
[facility]  Discharge Instructions: Thank you for choosing [facility] to provide your oncology and hematology care.   If you have a lab appointment with the Cancer Center, please go directly to the Cancer Center and check in at the registration area.   Wear comfortable clothing and clothing appropriate for easy access to any Portacath or PICC line.   We strive to give you quality time with your provider. You may need to reschedule your appointment if you arrive late (15 or more minutes).  Arriving late affects you and other patients whose appointments are after yours.  Also, if you miss three or more appointments without notifying the office, you may be dismissed from the clinic at the provider's discretion.      For prescription refill requests, have your pharmacy contact our office and allow 72 hours for refills to be completed.    Today you received the following chemotherapy and/or immunotherapy agents: Keytruda, Alimta.       To help prevent nausea and vomiting after your treatment, we encourage you to take your nausea medication as directed.  BELOW ARE SYMPTOMS THAT SHOULD BE REPORTED IMMEDIATELY: *FEVER GREATER THAN 100.4 F (38 C) OR HIGHER *CHILLS OR SWEATING *NAUSEA AND VOMITING THAT IS NOT CONTROLLED WITH YOUR NAUSEA MEDICATION *UNUSUAL SHORTNESS OF BREATH *UNUSUAL BRUISING OR BLEEDING *URINARY PROBLEMS (pain or burning when urinating, or frequent urination) *BOWEL PROBLEMS (unusual diarrhea, constipation, pain near the anus) TENDERNESS IN MOUTH AND THROAT WITH OR WITHOUT PRESENCE OF ULCERS (sore throat, sores in mouth, or a toothache) UNUSUAL RASH, SWELLING OR PAIN  UNUSUAL VAGINAL DISCHARGE OR ITCHING   Items with * indicate a potential emergency and should be followed up as soon as possible or go to the Emergency Department if any problems should occur.  Please show the CHEMOTHERAPY ALERT CARD or IMMUNOTHERAPY ALERT CARD  at check-in to the Emergency Department and triage nurse.  Should you have questions after your visit or need to cancel or reschedule your appointment, please contact [facility]  Dept: [phone]  and follow the prompts.  Office hours are 8:00 a.m. to 4:30 p.m. Monday - Friday. Please note that voicemails left after 4:00 p.m. may not be returned until the following business day.  We are closed weekends and major holidays. You have access to a nurse at all times for urgent questions. Please call the main number to the clinic Dept: [phone] and follow the prompts.   For any non-urgent questions, you may also contact your provider using MyChart. We now offer e-Visits for anyone 18 and older to request care online for non-urgent symptoms. For details visit [email or web address].   Also download the MyChart app! Go to the app store, search "MyChart", open the app, select [place], and log in with your MyChart username and password.   

## 2022-09-07 ENCOUNTER — Ambulatory Visit: Payer: Medicare Other | Admitting: Cardiology

## 2022-09-13 ENCOUNTER — Other Ambulatory Visit: Payer: Self-pay | Admitting: Internal Medicine

## 2022-09-14 ENCOUNTER — Ambulatory Visit
Admission: RE | Admit: 2022-09-14 | Discharge: 2022-09-14 | Disposition: A | Discharge status: Home / Self Care | Payer: Medicare Other | Source: Ambulatory Visit | Attending: Internal Medicine | Admitting: Internal Medicine

## 2022-09-14 DIAGNOSIS — C7931 Secondary malignant neoplasm of brain: Secondary | ICD-10-CM

## 2022-09-14 MED ORDER — GADOPICLENOL 0.5 MMOL/ML IV SOLN
7.5000 mL | Freq: Once | INTRAVENOUS | Status: AC | PRN
  Administered 2022-09-14: 7.5 mL via INTRAVENOUS

## 2022-09-16 ENCOUNTER — Inpatient Hospital Stay (HOSPITAL_BASED_OUTPATIENT_CLINIC_OR_DEPARTMENT_OTHER): Payer: Medicare Other | Admitting: Internal Medicine

## 2022-09-16 ENCOUNTER — Other Ambulatory Visit: Payer: Self-pay | Admitting: Cardiology

## 2022-09-16 DIAGNOSIS — C7931 Secondary malignant neoplasm of brain: Secondary | ICD-10-CM | POA: Diagnosis not present

## 2022-09-16 DIAGNOSIS — R569 Unspecified convulsions: Secondary | ICD-10-CM | POA: Diagnosis not present

## 2022-09-16 MED ORDER — DEXAMETHASONE 4 MG PO TABS: 4.0000 mg | ORAL_TABLET | Freq: Every day | ORAL | 0 refills | Status: DC

## 2022-09-16 NOTE — Progress Notes (Signed)
I connected with Kimberly Huang on 09/16/22 at 11:30 AM EDT by telephone visit and verified that I am speaking with the correct person using two identifiers.  I discussed the limitations, risks, security and privacy concerns of performing an evaluation and management service by telemedicine and the availability of in-person appointments. I also discussed with the patient that there may be a patient responsible charge related to this service. The patient expressed understanding and agreed to proceed.  Other persons participating in the visit and their role in the encounter:  n/a   Patient's location:  Home Provider's location:  Office Chief Complaint:  Metastatic cancer to brain [facility])  Focal seizure (HCC)  History of Present Ilness: Kimberly Huang reports worsening fatigue in recent weeks.  Gait is a little more unsteady as well.  She continues to experience some nausea and vomiting from chemotherapy.  No seizures.   Observations: Language and cognition at baseline  Imaging:  [facility] Clinician Interpretation: I have personally reviewed the CNS images as listed.  My interpretation, in the context of the patient's clinical presentation, is likely treatment effect pending official read  No results found.   Assessment and Plan: Metastatic cancer to brain [facility])  Focal seizure (HCC)  Kimberly Huang is clinically stable today.  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   Recommended resuming decadron 4mg  daily.  We'll give her a call in ~2 weeks to assess response and adjust dose.  Will otherwise con't to monitor with serial imaging, she is agreeable with this.  Follow Up Instructions:  We ask that Kimberly Huang return to clinic in 2 months following next brain MRI, or sooner as needed.  I discussed the assessment and treatment plan with the patient.  The patient was provided an opportunity to ask questions and all  were answered.  The patient agreed with the plan and demonstrated understanding of the instructions.    The patient was advised to call back or seek an in-person evaluation if the symptoms worsen or if the condition fails to improve as anticipated.    Henreitta Leber, MD   I provided 22 minutes of non face-to-face telephone visit time during this encounter, and > 50% was spent counseling as documented under my assessment & plan.

## 2022-09-17 ENCOUNTER — Telehealth: Payer: Self-pay | Admitting: Internal Medicine

## 2022-09-17 NOTE — Telephone Encounter (Signed)
Scheduled per 05/16 los, patient has been called and voicemail was left. 

## 2022-09-20 ENCOUNTER — Inpatient Hospital Stay: Payer: Medicare Other

## 2022-09-21 ENCOUNTER — Other Ambulatory Visit: Payer: Self-pay | Admitting: Cardiology

## 2022-09-24 ENCOUNTER — Ambulatory Visit (HOSPITAL_COMMUNITY)
Admission: RE | Admit: 2022-09-24 | Discharge: 2022-09-24 | Disposition: A | Discharge status: Home / Self Care | Payer: Medicare Other | Source: Ambulatory Visit | Attending: Physician Assistant | Admitting: Physician Assistant

## 2022-09-24 DIAGNOSIS — C3492 Malignant neoplasm of unspecified part of left bronchus or lung: Secondary | ICD-10-CM | POA: Diagnosis present

## 2022-09-24 MED ORDER — IOHEXOL 300 MG/ML  SOLN
100.0000 mL | Freq: Once | INTRAMUSCULAR | Status: AC | PRN
  Administered 2022-09-24: 100 mL via INTRAVENOUS

## 2022-09-27 NOTE — Telephone Encounter (Signed)
erroneous

## 2022-09-28 ENCOUNTER — Encounter: Payer: Self-pay | Admitting: Medical Oncology

## 2022-09-28 ENCOUNTER — Inpatient Hospital Stay: Payer: Medicare Other

## 2022-09-28 ENCOUNTER — Other Ambulatory Visit: Payer: Self-pay | Admitting: Internal Medicine

## 2022-09-28 ENCOUNTER — Telehealth: Payer: Self-pay | Admitting: Radiation Oncology

## 2022-09-28 ENCOUNTER — Other Ambulatory Visit: Payer: Self-pay | Admitting: Radiation Therapy

## 2022-09-28 ENCOUNTER — Inpatient Hospital Stay: Payer: Medicare Other | Admitting: Internal Medicine

## 2022-09-28 VITALS — BP 113/65 | HR 67 | Resp 16

## 2022-09-28 DIAGNOSIS — C3492 Malignant neoplasm of unspecified part of left bronchus or lung: Secondary | ICD-10-CM

## 2022-09-28 DIAGNOSIS — Z5111 Encounter for antineoplastic chemotherapy: Secondary | ICD-10-CM | POA: Diagnosis not present

## 2022-09-28 LAB — CBC WITH DIFFERENTIAL (CANCER CENTER ONLY)
Abs Immature Granulocytes: 0.05 10*3/uL (ref 0.00–0.07)
Basophils Absolute: 0 10*3/uL (ref 0.0–0.1)
Basophils Relative: 0 %
Eosinophils Absolute: 0 10*3/uL (ref 0.0–0.5)
Eosinophils Relative: 0 %
HCT: 33.6 % — ABNORMAL LOW (ref 36.0–46.0)
Hemoglobin: 11.9 g/dL — ABNORMAL LOW (ref 12.0–15.0)
Immature Granulocytes: 1 %
Lymphocytes Relative: 17 %
Lymphs Abs: 1.3 10*3/uL (ref 0.7–4.0)
MCH: 35.2 pg — ABNORMAL HIGH (ref 26.0–34.0)
MCHC: 35.4 g/dL (ref 30.0–36.0)
MCV: 99.4 fL (ref 80.0–100.0)
Monocytes Absolute: 0.7 10*3/uL (ref 0.1–1.0)
Monocytes Relative: 9 %
Neutro Abs: 5.7 10*3/uL (ref 1.7–7.7)
Neutrophils Relative %: 73 %
Platelet Count: 292 10*3/uL (ref 150–400)
RBC: 3.38 MIL/uL — ABNORMAL LOW (ref 3.87–5.11)
RDW: 14.7 % (ref 11.5–15.5)
WBC Count: 7.8 10*3/uL (ref 4.0–10.5)
nRBC: 0 % (ref 0.0–0.2)

## 2022-09-28 LAB — CMP (CANCER CENTER ONLY)
ALT: 31 U/L (ref 0–44)
AST: 30 U/L (ref 15–41)
Albumin: 3.6 g/dL (ref 3.5–5.0)
Alkaline Phosphatase: 72 U/L (ref 38–126)
Anion gap: 9 (ref 5–15)
BUN: 22 mg/dL (ref 8–23)
CO2: 24 mmol/L (ref 22–32)
Calcium: 8.8 mg/dL — ABNORMAL LOW (ref 8.9–10.3)
Chloride: 99 mmol/L (ref 98–111)
Creatinine: 0.79 mg/dL (ref 0.44–1.00)
GFR, Estimated: >60 mL/min (ref >60)
Glucose, Bld: 147 mg/dL — ABNORMAL HIGH (ref 70–99)
Potassium: 3.6 mmol/L (ref 3.5–5.1)
Sodium: 132 mmol/L — ABNORMAL LOW (ref 135–145)
Total Bilirubin: 0.8 mg/dL (ref 0.3–1.2)
Total Protein: 6.4 g/dL — ABNORMAL LOW (ref 6.5–8.1)

## 2022-09-28 MED ORDER — SODIUM CHLORIDE 0.9 % IV SOLN: Freq: Once | INTRAVENOUS | Status: AC

## 2022-09-28 MED ORDER — PROCHLORPERAZINE MALEATE 10 MG PO TABS
10.0000 mg | ORAL_TABLET | Freq: Once | ORAL | Status: AC
  Administered 2022-09-28: 10 mg via ORAL
  Filled 2022-09-28: qty 1

## 2022-09-28 MED ORDER — SODIUM CHLORIDE 0.9 % IV SOLN
500.0000 mg/m2 | Freq: Once | INTRAVENOUS | Status: AC
  Administered 2022-09-28: 1000 mg via INTRAVENOUS
  Filled 2022-09-28: qty 40

## 2022-09-28 MED ORDER — SODIUM CHLORIDE 0.9 % IV SOLN
200.0000 mg | Freq: Once | INTRAVENOUS | Status: AC
  Administered 2022-09-28: 200 mg via INTRAVENOUS
  Filled 2022-09-28: qty 200

## 2022-09-28 NOTE — Progress Notes (Signed)
Patient seen by Dr. Gypsy Balsam are within treatment parameters.  Labs reviewed: and are within treatment parameters.  Per physician team, patient is ready for treatment and there are NO modifications to the treatment plan. Pemetrexed and pembrolizumab

## 2022-09-28 NOTE — Telephone Encounter (Signed)
Called patient to schedule a consultation w. Dr. Squire. No answer, LVM for a return call.  

## 2022-09-28 NOTE — Progress Notes (Signed)
[facility] Telephone:([phone]   Fax:([phone]) [phone]  OFFICE PROGRESS NOTE  Patient, No Pcp Per No address on file  DIAGNOSIS: Stage IV (T1c, N2, M1 C) non-small cell lung cancer, adenocarcinoma with positive KRAS G12C mutation diagnosed in September 2023 and presented with left upper lobe lung mass in addition to left hilar and mediastinal lymphadenopathy as well as innumerable brain metastasis.  PRIOR THERAPY: SRS to multiple brain metastasis under the care of Dr. Basilio Cairo on February 24, 2022.  CURRENT THERAPY: Palliative systemic chemotherapy with carboplatin for AUC of 5, Alimta 500 Mg/M2 and Keytruda 200 Mg IV every 3 weeks.  Status post 10 cycles.  Starting from cycle #5 the patient will be on maintenance treatment with Alimta and Keytruda every 3 weeks.  INTERVAL HISTORY: ADALISE KORANDA 72 y.o. female returns to the clinic today for follow-up visit accompanied by her daughter-in-law Jabier Mutton.  The patient is feeling fine today with no concerning complaints except for mild fatigue.  She denied having any current chest pain, shortness of breath but has mild cough with no hemoptysis.  She has no nausea, vomiting, diarrhea or constipation.  She has no headache or visual changes.  She had MRI of the brain performed recently and it was felt to represent postradiation changes with necrosis.  She is currently on low-dose steroid.  She had repeat CT scan of the chest, abdomen and pelvis performed recently and she is here for evaluation and discussion of her scan results.  MEDICAL HISTORY: Past Medical History:  Diagnosis Date   Arthritis    knee, hands   CAD S/P PCI OM1 99%-0%; Plan Staged PCI mRCA 90%. 02/19/2017   1. Severe 2 vessel obstructive CAD    - 99% thrombotic occlusion of first OM. This is a bifurcating vessel.     - 90% mid RCA-segmental 2. Good overall LV dysfunction with lateral HK 3. Moderately elevated LVEDP 4. Successful stenting of the first OM with  DES. Distal embolization into the distal lateral OM branch.   Plan: DAPT for one year. Will treat with IV Aggrastat for 18 hours. Start high dose statin, beta blocker. IV Ntg for BP control acutely. Plan for stage PCI of RCA on Monday 10/22 if no complication.   Essential hypertension 02/21/2017   Hyperlipidemia with target LDL less than 70 02/21/2017   Lung cancer (HCC)    Presence of drug-eluting stent in L Cx: OM1 (Xience SIERRA DES 3.5 x 15) 02/21/2017   STEMI involving left circumflex coronary artery (HCC) 02/19/2017   Tobacco abuse 02/19/2017    ALLERGIES:  is allergic to codeine.  MEDICATIONS:  Current Outpatient Medications  Medication Sig Dispense Refill   acetaminophen (TYLENOL) 500 MG tablet Take 500 mg by mouth as needed for moderate pain.     aspirin 81 MG chewable tablet Chew 1 tablet (81 mg total) by mouth daily.     atorvastatin (LIPITOR) 80 MG tablet TAKE 1 TABLET BY MOUTH DAILY AT 6 PM. 90 tablet 3   Coenzyme Q10 (CO Q 10) 100 MG CAPS Take 100 mg by mouth every evening.     dexamethasone (DECADRON) 4 MG tablet Take 1 tablet (4 mg total) by mouth daily. 60 tablet 0   diclofenac Sodium (VOLTAREN) 1 % GEL Apply 1 Application topically daily as needed (Knee pain).     ezetimibe (ZETIA) 10 MG tablet TAKE 1 TABLET BY MOUTH EVERY DAY 90 tablet 0   folic acid (FOLVITE) 1 MG  tablet Take 1 tablet (1 mg total) by mouth daily. 90 tablet 1   furosemide (LASIX) 20 MG tablet Once daily only as needed for swelling of the lower extremities 30 tablet 6   irbesartan (AVAPRO) 150 MG tablet Take 1 tablet (150 mg total) by mouth daily. 90 tablet 3   levETIRAcetam (KEPPRA) 500 MG tablet TAKE 1 TABLET BY MOUTH TWICE A DAY 180 tablet 1   metoprolol tartrate (LOPRESSOR) 25 MG tablet TAKE 1 TABLET BY MOUTH TWICE A DAY 90 tablet 3   nitroGLYCERIN (NITROSTAT) 0.4 MG SL tablet Place 1 tablet (0.4 mg total) under the tongue every 5 (five) minutes x 3 doses as needed for chest pain. 25 tablet 2    ondansetron (ZOFRAN) 8 MG tablet Take 1 tablet (8 mg total) by mouth every 8 (eight) hours as needed for nausea or vomiting. Starting day 3 after chemotherapy (Patient taking differently: Take 8 mg by mouth as needed for nausea or vomiting.) 30 tablet 2   pantoprazole (PROTONIX) 40 MG tablet TAKE 1 TABLET BY MOUTH EVERY DAY 90 tablet 1   prochlorperazine (COMPAZINE) 10 MG tablet TAKE 1 TABLET BY MOUTH EVERY 6 HOURS AS NEEDED 30 tablet 2   traMADol (ULTRAM) 50 MG tablet Take 50 mg by mouth daily as needed for moderate pain.     No current facility-administered medications for this visit.    SURGICAL HISTORY:  Past Surgical History:  Procedure Laterality Date   BRONCHIAL BIOPSY  01/15/2022   Procedure: BRONCHIAL BIOPSIES;  Surgeon: Omar Person, MD;  Location: [facility] ENDOSCOPY;  Service: Pulmonary;;   BRONCHIAL BRUSHINGS  01/15/2022   Procedure: BRONCHIAL BRUSHINGS;  Surgeon: Omar Person, MD;  Location: [facility] ENDOSCOPY;  Service: Pulmonary;;   BRONCHIAL NEEDLE ASPIRATION BIOPSY  01/15/2022   Procedure: BRONCHIAL NEEDLE ASPIRATION BIOPSIES;  Surgeon: Omar Person, MD;  Location: [facility] ENDOSCOPY;  Service: Pulmonary;;   CARDIAC CATHETERIZATION  02/21/2017   CHOLECYSTECTOMY     CORONARY STENT INTERVENTION N/A 02/19/2017   Procedure: CORONARY STENT INTERVENTION;  Surgeon: Swaziland, Peter M, MD;  Location: [facility] INVASIVE CV LAB;  Service: Cardiovascular;  Laterality: N/A;   CORONARY STENT INTERVENTION N/A 02/21/2017   Procedure: CORONARY STENT INTERVENTION;  Surgeon: Lyn Records, MD;  Location: [facility] INVASIVE CV LAB;  Service: Cardiovascular;  Laterality: N/A;   FIDUCIAL MARKER PLACEMENT  01/15/2022   Procedure: FIDUCIAL MARKER PLACEMENT;  Surgeon: Omar Person, MD;  Location: [facility] ENDOSCOPY;  Service: Pulmonary;;   FINE NEEDLE ASPIRATION  01/15/2022   Procedure: FINE NEEDLE ASPIRATION;  Surgeon: Omar Person, MD;  Location: [facility] ENDOSCOPY;  Service: Pulmonary;;   HEMOSTASIS CONTROL   01/15/2022   Procedure: HEMOSTASIS CONTROL;  Surgeon: Omar Person, MD;  Location: [facility] ENDOSCOPY;  Service: Pulmonary;;   KNEE ARTHROSCOPY Right 2010   KNEE ARTHROSCOPY WITH SUBCHONDROPLASTY Left 01/09/2020   Procedure: Left knee arthroscopy,partial medial meniscectomy, debridement, subchondroplasty left proximal tibia;  Surgeon: Jene Every, MD;  Location: [name];  Service: Orthopedics;  Laterality: Left;   LEFT HEART CATH AND CORONARY ANGIOGRAPHY N/A 02/19/2017   Procedure: LEFT HEART CATH AND CORONARY ANGIOGRAPHY;  Surgeon: Swaziland, Peter M, MD;  Location: [facility] INVASIVE CV LAB;  Service: Cardiovascular;  Laterality: N/A;   TONSILLECTOMY     TUBAL LIGATION     VIDEO BRONCHOSCOPY WITH ENDOBRONCHIAL ULTRASOUND N/A 01/15/2022   Procedure: VIDEO BRONCHOSCOPY WITH ENDOBRONCHIAL ULTRASOUND;  Surgeon: Omar Person, MD;  Location: [facility] ENDOSCOPY;  Service: Pulmonary;  Laterality: N/A;  VIDEO BRONCHOSCOPY WITH RADIAL ENDOBRONCHIAL ULTRASOUND  01/15/2022   Procedure: RADIAL ENDOBRONCHIAL ULTRASOUND;  Surgeon: Omar Person, MD;  Location: [facility] ENDOSCOPY;  Service: Pulmonary;;    REVIEW OF SYSTEMS:  Constitutional: positive for fatigue Eyes: negative Ears, nose, mouth, throat, and face: negative Respiratory: positive for cough Cardiovascular: negative Gastrointestinal: negative Genitourinary:negative Integument/breast: negative Hematologic/lymphatic: negative Musculoskeletal:negative Neurological: negative Behavioral/Psych: negative Endocrine: negative Allergic/Immunologic: negative   PHYSICAL EXAMINATION: General appearance: alert, cooperative, fatigued, and no distress Head: Normocephalic, without obvious abnormality, atraumatic Neck: no adenopathy, no JVD, supple, symmetrical, trachea midline, and thyroid not enlarged, symmetric, no tenderness/mass/nodules Lymph nodes: Cervical, supraclavicular, and axillary nodes normal. Resp: clear to auscultation bilaterally Back: symmetric,  no curvature. ROM normal. No CVA tenderness. Cardio: regular rate and rhythm, S1, S2 normal, no murmur, click, rub or gallop GI: soft, non-tender; bowel sounds normal; no masses,  no organomegaly Extremities: edema trace edema bilateral Neurologic: Alert and oriented X 3, normal strength and tone. Normal symmetric reflexes. Normal coordination and gait  ECOG PERFORMANCE STATUS: 1 - Symptomatic but completely ambulatory  Blood pressure 107/75, pulse 80, temperature 97.7 F (36.5 C), temperature source Temporal, resp. rate 17, height 5\' 2"  (1.575 m), weight 167 lb 14.4 oz (76.2 kg), SpO2 97 %.  LABORATORY DATA: Lab Results  Component Value Date   WBC 7.8 09/28/2022   HGB 11.9 (L) 09/28/2022   HCT 33.6 (L) 09/28/2022   MCV 99.4 09/28/2022   PLT 292 09/28/2022      Chemistry      Component Value Date/Time   NA 132 (L) 09/28/2022 1045   NA 138 07/19/2022 1620   K 3.6 09/28/2022 1045   CL 99 09/28/2022 1045   CO2 24 09/28/2022 1045   BUN 22 09/28/2022 1045   BUN 8 07/19/2022 1620   CREATININE 0.79 09/28/2022 1045      Component Value Date/Time   CALCIUM 8.8 (L) 09/28/2022 1045   ALKPHOS 72 09/28/2022 1045   AST 30 09/28/2022 1045   ALT 31 09/28/2022 1045   BILITOT 0.8 09/28/2022 1045       RADIOGRAPHIC STUDIES: CT CHEST ABDOMEN PELVIS W CONTRAST  Result Date: 09/25/2022 CLINICAL DATA:  Non-small cell lung cancer. Adenocarcinoma. Restaging. * Tracking Code: BO *. EXAM: CT CHEST, ABDOMEN, AND PELVIS WITH CONTRAST TECHNIQUE: Multidetector CT imaging of the chest, abdomen and pelvis was performed following the standard protocol during bolus administration of intravenous contrast. RADIATION DOSE REDUCTION: This exam was performed according to the departmental dose-optimization program which includes automated exposure control, adjustment of the mA and/or kV according to patient size and/or use of iterative reconstruction technique. CONTRAST:  OMNIPAQUE IOHEXOL 300 MG/ML  SOLN  COMPARISON:  07/22/2022 FINDINGS: CT CHEST FINDINGS Cardiovascular: Heart size appears within normal limits. Aortic atherosclerosis. Coronary artery calcifications. No pericardial effusion. Mediastinum/Nodes: Thyroid gland, trachea and esophagus are unremarkable. Index left hilar lymph node has progressed from the previous exam now measuring 2.5 x 2.1 cm, image 31/2. Previously 1.3 x 0.9 cm. Calcified mediastinal lymph nodes. Lungs/Pleura: No pleural effusion. Nodule within the posterior left upper lobe abutting the oblique fissure measures 2.3 x 1.1 cm, image 64/4. On the previous exam this measured 2.5 x 1.3 cm. Additional tiny lung nodules are unchanged including 3 mm lingular nodule, image 82/4. No new lung nodules identified. Musculoskeletal: No chest wall mass or suspicious bone lesions identified. CT ABDOMEN PELVIS FINDINGS Hepatobiliary: No focal liver abnormality is seen. Status post cholecystectomy. No biliary dilatation. Pancreas: Unremarkable. No pancreatic ductal dilatation  or surrounding inflammatory changes. Spleen: Calcified granulomas noted.  Spleen otherwise unremarkable. Adrenals/Urinary Tract: Bilateral adrenal gland adenomas are again seen and appear unchanged from previous exam. The largest is in the left adrenal gland measuring 3.6 by 2.2 cm, image 61/2. No follow-up imaging recommended. Nonobstructing stone within the left kidney measures 1.2 cm. Bilateral fluid attenuating kidney lesions are identified compatible with multiple cysts. The largest is in the upper pole of left kidney measuring 6.5 cm. No hydronephrosis identified bilaterally. Bladder appears normal. Stomach/Bowel: Stomach is within normal limits. Appendix appears normal. No evidence of bowel wall thickening, distention, or inflammatory changes. Vascular/Lymphatic: Aortic atherosclerosis. No enlarged abdominal or pelvic lymph nodes. Reproductive: Uterus and bilateral adnexa are unremarkable. Other: No ascites or focal fluid  collections. Musculoskeletal: No acute or significant osseous findings. IMPRESSION: 1. Interval increase in size of left hilar lymph node compatible with progression of disease. 2. The posterior left upper lobe lung nodule is not significantly changed in size in the interval. 3. No signs of metastatic disease to the abdomen or pelvis. 4. Nonobstructing left renal calculus. 5.  Aortic Atherosclerosis (ICD10-I70.0). Electronically Signed   By: Signa Kell M.D.   On: 09/25/2022 10:26   MR BRAIN W WO CONTRAST  Result Date: 09/17/2022 CLINICAL DATA:  Provided history: Brain/CNS neoplasm, assess treatment response. Metastatic cancer to brain. Additional history obtained from electronic MEDICAL RECORD NUMBERHistory of metastatic lung cancer and prior SRS. EXAM: MRI HEAD WITHOUT AND WITH CONTRAST TECHNIQUE: Multiplanar, multiecho pulse sequences of the brain and surrounding structures were obtained without and with intravenous contrast. CONTRAST:  7.5 mL Vueway intravenous contrast. COMPARISON:  Prior brain MRI examinations 06/09/2022 and earlier. FINDINGS: Brain: Mild generalized parenchymal atrophy. Multiple enhancing intracranial metastases are again demonstrated, as follows. 14 x 13 mm peripherally enhancing lesion within the mid left frontal lobe, increased in size (previously measuring 9 x 9 mm) (series 14, image 119). Moderate surrounding edema has increased. 11 mm peripherally enhancing lesion centered within the right postcentral gyrus, increased in size (previously measuring 8 mm) (series 14, image 116). Mild surrounding edema has subtly increased. Punctate enhancing lesion within the medial right parietal lobe, decreased in size (previously 3 mm) (series 14, image 110). No significant surrounding edema. 18 x 13 mm peripherally enhancing lesion within the left parietal lobe, increased in size (previously measuring 13 x 10 mm) (series 14, image 109). Moderate surrounding edema has increased. 24 x 18 mm  peripherally enhancing lesion within the right occipital lobe, increased in size (previously measuring 19 x 13 mm) (series 14, image 74). Moderate surrounding edema is similar to the prior exam. 17 x 11 mm peripherally enhancing lesion within the medial left cerebellar hemisphere/left aspect of the cerebellar vermis (previously 11 x 8 mm) (series 14, image 40). Mild surrounding edema has not significantly changed. A previously demonstrated punctate enhancing lesion within the posteromedial left frontal lobe is no longer appreciated. A previously demonstrated 2 mm lesion within the right cerebellar hemisphere no longer has appreciable enhancement. No new intracranial metastasis is identified. As before, there are chronic blood products associated with some of the metastases. Mild multifocal T2 FLAIR hyperintense signal abnormality elsewhere within the cerebral white matter, nonspecific but compatible chronic small vessel ischemic disease. There is no acute infarct. No extra-axial fluid collection. No midline shift. Vascular: Maintained flow voids within the proximal large arterial vessels. Skull and upper cervical spine: No focal suspicious marrow lesion. Incompletely assessed cervical spondylosis. Sinuses/Orbits: No mass or acute finding within the imaged orbits.  Minimal mucosal thickening within the bilateral ethmoid sinuses. IMPRESSION: 1. Interval increase in size of known metastases within the left frontal lobe, right parietal lobe, left parietal lobe, right occipital lobe and left cerebellar hemisphere/cerebellar vermis as described. 2. A punctate enhancing metastasis within the medial right parietal lobe has decreased in size. 3. Previously demonstrated metastases within the posteromedial left frontal lobe and right cerebellar hemisphere no longer show enhancement. 4. No new intracranial metastasis identified. Electronically Signed   By: Jackey Loge D.O.   On: 09/17/2022 12:12    ASSESSMENT AND PLAN: This  is a very pleasant 72 years old white female with Stage IV (T1c, N2, M1 C) non-small cell lung cancer, adenocarcinoma with positive KRAS G12C mutation diagnosed in September 2023 and presented with left upper lobe lung mass in addition to left hilar and mediastinal lymphadenopathy as well as innumerable brain metastasis. The patient is scheduled for SRS to multiple brain metastasis on February 24, 2022. The patient is currently undergoing systemic chemotherapy with carboplatin for AUC of 5, Alimta 500 Mg/M2 and Keytruda 200 Mg IV every 3 weeks status post 10 cycles.  Starting from cycle #5 she is on maintenance treatment with Alimta and Keytruda every 3 weeks. The patient has been tolerating this treatment fairly well with no concerning adverse effects. She had repeat CT scan of the chest, abdomen and pelvis performed recently.  I personally and independently reviewed the scan images and discussed the result and showed the images to the patient and her daughter-in-law today. Her scan showed stable disease except for the interval increase in the left hilar lymph node. I discussed with the patient several options for management of her condition including continuation of the current treatment and radiotherapy to the enlarging left hilar mass versus switching her treatment to K-ras G12 C mutation inhibitor.  After discussion of the 2 option the patient would like to continue on the current treatment with maintenance Alimta and Keytruda in addition to the palliative radiotherapy to the left hilar nodule. I will refer her to Dr. Basilio Cairo for discussion of the radiotherapy portion. If the patient develop any additional disease progression in the future, I will switch her treatment to [place] [facility]). For the constipation, she will use MiraLAX and other laxatives for now. For the swelling of the lower extremity, this significantly improved with the treatment of Lasix. She will come back for follow-up visit in 3  weeks for evaluation before the next cycle of her treatment. The patient was advised to call immediately if she has any concerning symptoms in the interval. The patient voices understanding of current disease status and treatment options and is in agreement with the current care plan.  All questions were answered. The patient knows to call the clinic with any problems, questions or concerns. We can certainly see the patient much sooner if necessary.  The total time spent in the appointment was 30 minutes.  Disclaimer: This note was dictated with voice recognition software. Similar sounding words can inadvertently be transcribed and may not be corrected upon review.

## 2022-09-28 NOTE — Patient Instructions (Signed)
[facility]  Discharge Instructions: Thank you for choosing [facility] to provide your oncology and hematology care.   If you have a lab appointment with the Cancer Center, please go directly to the Cancer Center and check in at the registration area.   Wear comfortable clothing and clothing appropriate for easy access to any Portacath or PICC line.   We strive to give you quality time with your provider. You may need to reschedule your appointment if you arrive late (15 or more minutes).  Arriving late affects you and other patients whose appointments are after yours.  Also, if you miss three or more appointments without notifying the office, you may be dismissed from the clinic at the provider's discretion.      For prescription refill requests, have your pharmacy contact our office and allow 72 hours for refills to be completed.    Today you received the following chemotherapy and/or immunotherapy agents: Keytruda/Alimta      To help prevent nausea and vomiting after your treatment, we encourage you to take your nausea medication as directed.  BELOW ARE SYMPTOMS THAT SHOULD BE REPORTED IMMEDIATELY: *FEVER GREATER THAN 100.4 F (38 C) OR HIGHER *CHILLS OR SWEATING *NAUSEA AND VOMITING THAT IS NOT CONTROLLED WITH YOUR NAUSEA MEDICATION *UNUSUAL SHORTNESS OF BREATH *UNUSUAL BRUISING OR BLEEDING *URINARY PROBLEMS (pain or burning when urinating, or frequent urination) *BOWEL PROBLEMS (unusual diarrhea, constipation, pain near the anus) TENDERNESS IN MOUTH AND THROAT WITH OR WITHOUT PRESENCE OF ULCERS (sore throat, sores in mouth, or a toothache) UNUSUAL RASH, SWELLING OR PAIN  UNUSUAL VAGINAL DISCHARGE OR ITCHING   Items with * indicate a potential emergency and should be followed up as soon as possible or go to the Emergency Department if any problems should occur.  Please show the CHEMOTHERAPY ALERT CARD or IMMUNOTHERAPY ALERT CARD at  check-in to the Emergency Department and triage nurse.  Should you have questions after your visit or need to cancel or reschedule your appointment, please contact [facility]  Dept: [phone]  and follow the prompts.  Office hours are 8:00 a.m. to 4:30 p.m. Monday - Friday. Please note that voicemails left after 4:00 p.m. may not be returned until the following business day.  We are closed weekends and major holidays. You have access to a nurse at all times for urgent questions. Please call the main number to the clinic Dept: [phone] and follow the prompts.   For any non-urgent questions, you may also contact your provider using MyChart. We now offer e-Visits for anyone 18 and older to request care online for non-urgent symptoms. For details visit [email or web address].   Also download the MyChart app! Go to the app store, search "MyChart", open the app, select [facility], and log in with your MyChart username and password.   

## 2022-09-29 ENCOUNTER — Telehealth: Payer: Self-pay | Admitting: Radiation Oncology

## 2022-09-29 NOTE — Telephone Encounter (Signed)
Called patient to schedule a consultation w. Dr. Squire. No answer, LVM for a return call.  

## 2022-09-30 ENCOUNTER — Telehealth: Payer: Self-pay | Admitting: Radiation Oncology

## 2022-09-30 ENCOUNTER — Inpatient Hospital Stay (HOSPITAL_BASED_OUTPATIENT_CLINIC_OR_DEPARTMENT_OTHER): Payer: Medicare Other | Admitting: Internal Medicine

## 2022-09-30 DIAGNOSIS — C7931 Secondary malignant neoplasm of brain: Secondary | ICD-10-CM | POA: Diagnosis not present

## 2022-09-30 MED ORDER — DEXAMETHASONE 1 MG PO TABS: ORAL_TABLET | ORAL | 0 refills | Status: AC

## 2022-09-30 NOTE — Telephone Encounter (Signed)
Called patient to schedule a consultation w. Dr. Squire. No answer, LVM for a return call.  

## 2022-09-30 NOTE — Progress Notes (Signed)
I connected with Kimberly Huang on 09/30/22 at 10:00 AM EDT by telephone visit and verified that I am speaking with the correct person using two identifiers.  I discussed the limitations, risks, security and privacy concerns of performing an evaluation and management service by telemedicine and the availability of in-person appointments. I also discussed with the patient that there may be a patient responsible charge related to this service. The patient expressed understanding and agreed to proceed.  Other persons participating in the visit and their role in the encounter:  daughter  Patient's location:  Home Provider's location:  Office Chief Complaint:  Metastatic cancer to brain [facility])  History of Present Ilness: Kimberly Huang reports improvement in balance issues since restarting the decadron 4mg  daily.  She also feels improved energy.  Sleep has been more interrupted, however.  No issues with chemo, she has upcoming re-consult with Dr. Basilio Cairo for progressive lung nodule.  Observations: Language and cognition at baseline  Assessment and Plan: Metastatic cancer to brain [facility])  Clinically improved with corticosteroids.  Recommended decreasing decadron by 1mg  each week until discontinued, if tolerated.  Follow Up Instructions: RTC in July after MRI brain  I discussed the assessment and treatment plan with the patient.  The patient was provided an opportunity to ask questions and all were answered.  The patient agreed with the plan and demonstrated understanding of the instructions.    The patient was advised to call back or seek an in-person evaluation if the symptoms worsen or if the condition fails to improve as anticipated.    Henreitta Leber, MD   I provided 18 minutes of non face-to-face telephone visit time during this encounter, and > 50% was spent counseling as documented under my assessment & plan.

## 2022-09-30 NOTE — Telephone Encounter (Signed)
Sent unable to contact letter 5/30.

## 2022-09-30 NOTE — Telephone Encounter (Signed)
Error

## 2022-10-01 NOTE — Progress Notes (Incomplete)
Histology and Location of Primary Cancer:  01/15/2022  Clinical History: Mass of left lung  Specimen Submitted:  C. LUNG, LUL, LAVAGE:   FINAL MICROSCOPIC DIAGNOSIS:  - Malignant cells consistent with adenocarcinoma   SPECIMEN ADEQUACY:  Satisfactory for evaluation   01/15/2022  FINAL MICROSCOPIC DIAGNOSIS:  A.  LEFT LUNG, UPPER LOBE, FINE NEEDLE ASPIRATION:  - Malignant  - Adenocarcinoma   B.  LEFT LUNG, UPPER LOBE, BRUSHING:  - Suspicious  - Rare atypical large cells suspicious for tumor  - Background pulmonary macrophages with scattered benign bronchial cells  and inflammatory cells   COMMENT:   Three immunohistochemical stains are performed with adequate control on  the large atypical cells present within the cellblock of the fine-needle  aspirate (A).  The cells are positive for the pulmonary adeno marker  TTF-1.  They are negative for the squamous marker p40 and the  histiocytic marker CD68.  The cytohistomorphology and this  immunohistochemical pattern support the above diagnosis.   CT CHEST ABDOMEN PELVIS W CONTRAST 07/22/2022  IMPRESSION: 1. Similar size of a posterior left upper lobe pulmonary nodule. 2. Developing left infrahilar adenopathy, suspicious for isolated nodal metastasis. This could either be re-evaluated on contrast-enhanced chest CT follow-up at 3 months or more entirely characterized with PET. 3. Otherwise, no evidence of metastatic disease. 4. Incidental findings, including: Hepatic steatosis. Left nephrolithiasis. Bilateral adrenal adenomas.  Location(s) of Symptomatic tumor(s): Brain  MR BRAIN W WO CONTRAST 09/14/2022  IMPRESSION: 1. Interval increase in size of known metastases within the left frontal lobe, right parietal lobe, left parietal lobe, right occipital lobe and left cerebellar hemisphere/cerebellar vermis as described. 2. A punctate enhancing metastasis within the medial right parietal lobe has decreased in size. 3.  Previously demonstrated metastases within the posteromedial left frontal lobe and right cerebellar hemisphere no longer show enhancement. 4. No new intracranial metastasis identified.  Past/Anticipated chemotherapy by medical oncology, if any:  Henreitta Leber, MD 04/06/2022  Oncology History  Adenocarcinoma of left lung, stage 4 (HCC)  02/08/2022 Initial Diagnosis    Adenocarcinoma of left lung, stage 4 (HCC)    02/08/2022 Cancer Staging    Staging form: Lung, AJCC 8th Edition - Clinical: Stage IVB (cT1c, cN2, cM1c) - Signed by Si Gaul, MD on 02/08/2022    03/01/2022 -  Chemotherapy    Patient is on Treatment Plan : LUNG Carboplatin (5) + Pemetrexed (500) + Pembrolizumab (200) D1 q21d Induction x 4 cycles / Maintenance Pemetrexed (500) + Pembrolizumab (200) D1 q21d       CNS Oncologic History 02/24/22: SRS to >12 CNS metastases Basilio Cairo)  Assessment/Plan Metastatic cancer to brain [facility])   Focal seizure (HCC)   [name] presents with clinical and radiographic syndrome c/w focal seizure, localizing to the left pre-central gyrus.  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     We recommended initiating therapy with Keppra 500mg  BID for seizure prevention.   We counseled her and her daughter on epilepsy safety today, including driving restrictions.     Burden of edema is minimal; decadron should be decreased to 4mg  daily x5 days, then 2mg  daily x5 days, then stopped if tolerated.   We spent twenty additional minutes teaching regarding the natural history, biology, and historical experience in the treatment of neurologic complications of cancer.    We appreciate the opportunity to participate in the care of AMIIRA LIER.    [name] will follow up with [place] in late January following next MRI  brain Mosie Lukes, MD 09/28/2022  DIAGNOSIS: Stage IV (T1c, N2, M1 C) non-small cell lung cancer, adenocarcinoma with positive KRAS G12C mutation  diagnosed in September 2023 and presented with left upper lobe lung mass in addition to left hilar and mediastinal lymphadenopathy as well as innumerable brain metastasis.   ASSESSMENT AND PLAN: This is a very pleasant 72 years old white female with Stage IV (T1c, N2, M1 C) non-small cell lung cancer, adenocarcinoma with positive KRAS G12C mutation diagnosed in September 2023 and presented with left upper lobe lung mass in addition to left hilar and mediastinal lymphadenopathy as well as innumerable brain metastasis. The patient is scheduled for SRS to multiple brain metastasis on February 24, 2022. The patient is currently undergoing systemic chemotherapy with carboplatin for AUC of 5, Alimta 500 Mg/M2 and Keytruda 200 Mg IV every 3 weeks status post 10 cycles.  Starting from cycle #5 she is on maintenance treatment with Alimta and Keytruda every 3 weeks. The patient has been tolerating this treatment fairly well with no concerning adverse effects. She had repeat CT scan of the chest, abdomen and pelvis performed recently.  I personally and independently reviewed the scan images and discussed the result and showed the images to the patient and her daughter-in-law today. Her scan showed stable disease except for the interval increase in the left hilar lymph node. I discussed with the patient several options for management of her condition including continuation of the current treatment and radiotherapy to the enlarging left hilar mass versus switching her treatment to K-ras G12 C mutation inhibitor.  After discussion of the 2 option the patient would like to continue on the current treatment with maintenance Alimta and Keytruda in addition to the palliative radiotherapy to the left hilar nodule. I will refer her to Dr. Basilio Cairo for discussion of the radiotherapy portion. If the patient develop any additional disease progression in the future, I will switch her treatment to [place] W[employer]/ranger). For the  constipation, she will use MiraLAX and other laxatives for now. For the swelling of the lower extremity, this significantly improved with the treatment of Lasix. She will come back for follow-up visit in 3 weeks for evaluation before the next cycle of her treatment. The patient was advised to call immediately if she has any concerning symptoms in the interval. The patient voices understanding of current disease status and treatment options and is in agreement with the current care plan.  Henreitta Leber, MD 09/16/2022  Assessment and Plan: Metastatic cancer to brain [facility])   Focal seizure (HCC)   [name] is clinically stable today.  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    Recommended resuming decadron 4mg  daily.  We'll give her a call in ~2 weeks to assess response and adjust dose.   Will otherwise con't to monitor with serial imaging, she is agreeable with this.   Follow Up Instructions:   We ask that AVERLEE REDFERN return to clinic in 2 months following next brain MRI, or sooner as needed.  Patient's main complaints related to symptomatic tumor(s) are: Focal seizure (per Dr. Liana Gerold note on 06-17-22),  [name] presented to neurologic attention on 03/22/22, when she experienced sudden onset slurred speech and left facial weakness while undergoing chemotherapy infusion for lung cancer.  She has poor recall of the events, and describes impaired awareness and memory for 1-2 hours following the episode.  She returned to baseline neurologic function  thereafter. (Per Dr. Liana Gerold note on 04-06-22)  Past or anticipated interventions, if any, per neurosurgery:  02-24-22 PRE-OPERATIVE DIAGNOSIS:  Lung Cancer, Multiple Brain Metastases   POST-OPERATIVE DIAGNOSIS:  Lung Cancer, Multiple Brain Metastases   PROCEDURE:  Stereotactic Radiosurgery   SURGEON:  Jackelyn Hoehn, MD  Dose of Decadron, if  applicable: yes, 3mg   (started on 09-30-22)  Respiratory concerns: Short of breath with moderate exertion at times  Weight Loss: Has noticed some weight loss without trying  Recent neurologic symptoms, if any:  Seizures: no Headaches: no Nausea: no Dizziness/ataxia: yes, a little bit to report Difficulty with hand coordination: yes, difficulty with writing, has has since this diagnoses Focal numbness/weakness: no Visual deficits/changes: yes, left eye blurry, has been going on since diagnosis Confusion/Memory deficits: yes, cannot remember as well at times  Painful bone metastases at present, if any: no  Pain on a scale of 0-10 is: none to report   Ambulatory status? Walker? Wheelchair?: walks with cane and walker at home  SAFETY ISSUES: Prior radiation? Yes, SRS on 02-24-22 Pacemaker/ICD? no Possible current pregnancy? no Is the patient on methotrexate? no  Additional Complaints / other details:  Wants to know what to expect and plan overall. She would like to reuse her mask if possible for CT sim and radiation.

## 2022-10-04 ENCOUNTER — Telehealth: Payer: Self-pay

## 2022-10-04 NOTE — Telephone Encounter (Signed)
Rn attempted to call pt for meaningful use and nurse evaluation information without success. A detailed message with call back information was left for pt. Rn will attempt to call back later this afternoon.

## 2022-10-04 NOTE — Progress Notes (Signed)
Radiation Oncology         [phone] ________________________________  Outpatient Re-Consultation  Name: Kimberly Huang MRN: [id number]  Date: 10/05/2022  DOB: 12-Sep-1950  WG:NFAOZHY, No Pcp Per  Si Gaul, MD   REFERRING PHYSICIAN: Si Gaul, MD  DIAGNOSIS: No diagnosis found.   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 female who presents today for consideration of radiation therapy to an increasing left hilar lymph node from a metastatic lung cancer primary.   She was last seen here following radiation to the brain on 03/31/22. She has since continued with systemic therapy consisting of carboplatin, alimta, and keytruda. Starting from cycle #5 on 05/24/22, she was transitioned to maintenance treatment with Alimta and Keytruda every 3 weeks. She tolerated this treatment well with no concerning adverse effect except for occasional nausea. She also complained of swelling of the lower extremities. For the swelling of the lower extremity she was treated with Keflex with no improvement. She was subsequently started on Lasix 10 mg p.o. daily on 06/14/22 as needed for the swelling. She was also given Compazine for management of nausea.   CT AP performed on 05/12/22 demonstrated significant improvement in the left hilar and adjacent mediastinal lymph nodes, along with a decrease in size of the primary lesion in the left lung. No new findings were appreciated overall.   Follow-up MRI of the brain on 06/09/22 showed evidence of a mixed treatment response with a decrease in size and or resolution of most intracranial lesions, but with an increase  in size of the 2 lesions with surrounding vasogenic edema. No new lesions were appreciated.   CT CAP on 07/22/22 demonstrated developing left infrahilar adenopathy, suspicious for isolated nodal metastasis. CT otherwise showed no change in size of the LUL nodule, and no evidence of metastatic disease.     MRI of the brain on 09/14/22 also unfortunately showed an interval increase in size of the know metastases within the left frontal lobe, right parietal lobe left parietal lobe, right occipital lobe, and left cerebellar hemisphere/cerebellar vermis. MRI otherwise showed a a decrease in size of the punctate enhancing metastasis within the medial right parietal lobe, and no residual enhancement pertaining to the metastases within the posteromedial left frontal lobe and right cerebellar hemisphere.   Her most recent CT CAP on 09/24/22 demonstrated an interval increase in size of the left hilar lymph node compatible with progression of disease. CT otherwise showed no significant change in size of the posterior LUL nodule and no signs of metastatic disease in the abdomen or pelvis.   After discussing these findings and treatment options, the patient would like to continue on her current treatment with maintenance Alimta and Keytruda in addition to the palliative radiotherapy to the left hilar nodule. From a disease standpoint, the patient feels well overall other than fatigue.   PREVIOUS RADIATION THERAPY: Yes   Intent: Palliative  Radiation Treatment Dates: 02/24/2022 through 02/24/2022 Site Technique Total Dose (Gy) Dose per Fx (Gy) Completed Fx Beam Energies  Brain: Brain IMRT 20/20 20 1/1 [id number]    PAST MEDICAL HISTORY:  has a past medical history of Arthritis, CAD S/P PCI OM1  99%-0%; Plan Staged PCI mRCA 90%. (02/19/2017), Essential hypertension (02/21/2017), Hyperlipidemia with target LDL less than 70 (02/21/2017), Lung cancer (HCC), Presence of drug-eluting stent in L Cx: OM1 (Xience SIERRA DES  3.5 x 15) (02/21/2017), STEMI involving left circumflex coronary artery (HCC) (02/19/2017), and Tobacco abuse (02/19/2017).    PAST SURGICAL HISTORY: Past Surgical History:  Procedure Laterality Date   BRONCHIAL BIOPSY  01/15/2022   Procedure: BRONCHIAL BIOPSIES;  Surgeon: Omar Person, MD;  Location: [facility] ENDOSCOPY;  Service: Pulmonary;;   BRONCHIAL BRUSHINGS  01/15/2022   Procedure: BRONCHIAL BRUSHINGS;  Surgeon: Omar Person, MD;  Location: [facility] ENDOSCOPY;  Service: Pulmonary;;   BRONCHIAL NEEDLE ASPIRATION BIOPSY  01/15/2022   Procedure: BRONCHIAL NEEDLE ASPIRATION BIOPSIES;  Surgeon: Omar Person, MD;  Location: [facility] ENDOSCOPY;  Service: Pulmonary;;   CARDIAC CATHETERIZATION  02/21/2017   CHOLECYSTECTOMY     CORONARY STENT INTERVENTION N/A 02/19/2017   Procedure: CORONARY STENT INTERVENTION;  Surgeon: Swazil[place], Peter M, MD;  Location: [facility] INVASIVE CV LAB;  Service: Cardiovascular;  Laterality: N/A;   CORONARY STENT INTERVENTION N/A 02/21/2017   Procedure: CORONARY STENT INTERVENTION;  Surgeon: Lyn Records, MD;  Location: [facility] INVASIVE CV LAB;  Service: Cardiovascular;  Laterality: N/A;   FIDUCIAL MARKER PLACEMENT  01/15/2022   Procedure: FIDUCIAL MARKER PLACEMENT;  Surgeon: Omar Person, MD;  Location: [facility] ENDOSCOPY;  Service: Pulmonary;;   FINE NEEDLE ASPIRATION  01/15/2022   Procedure: FINE NEEDLE ASPIRATION;  Surgeon: Omar Person, MD;  Location: [facility] ENDOSCOPY;  Service: Pulmonary;;   HEMOSTASIS CONTROL  01/15/2022   Procedure: HEMOSTASIS CONTROL;  Surgeon: Omar Person, MD;  Location: [facility] ENDOSCOPY;  Service: Pulmonary;;   KNEE ARTHROSCOPY Right 2010   KNEE ARTHROSCOPY WITH SUBCHONDROPLASTY Left 01/09/2020   Procedure: Left knee arthroscopy,partial medial meniscectomy, debridement, subchondroplasty left proximal tibia;  Surgeon: Jene Every, MD;  Location: [name];  Service: Orthopedics;  Laterality: Left;   LEFT HEART CATH AND CORONARY ANGIOGRAPHY N/A  02/19/2017   Procedure: LEFT HEART CATH AND CORONARY ANGIOGRAPHY;  Surgeon: Swaziland, Peter M, MD;  Location: [facility] INVASIVE CV LAB;  Service: Cardiovascular;  Laterality: N/A;   TONSILLECTOMY     TUBAL LIGATION     VIDEO BRONCHOSCOPY WITH ENDOBRONCHIAL ULTRASOUND N/A 01/15/2022   Procedure: VIDEO BRONCHOSCOPY WITH ENDOBRONCHIAL ULTRASOUND;  Surgeon: Omar Person, MD;  Location: [facility] ENDOSCOPY;  Service: Pulmonary;  Laterality: N/A;   VIDEO BRONCHOSCOPY WITH RADIAL ENDOBRONCHIAL ULTRASOUND  01/15/2022   Procedure: RADIAL ENDOBRONCHIAL ULTRASOUND;  Surgeon: Omar Person, MD;  Location: [facility] ENDOSCOPY;  Service: Pulmonary;;    FAMILY HISTORY: family history includes Diabetes in her mother; Heart attack in her sister; Lung cancer in her father.  SOCIAL HISTORY:  reports that she has been smoking cigarettes. She has a 75.00 pack-year smoking history. She has never used smokeless tobacco. She reports current alcohol use. She reports that she does not use drugs.  ALLERGIES: Codeine  MEDICATIONS:  Current Outpatient Medications  Medication Sig Dispense Refill   acetaminophen (TYLENOL) 500 MG tablet Take 500 mg by mouth as needed for moderate pain.     aspirin 81 MG chewable tablet Chew 1 tablet (81 mg total) by mouth daily.     atorvastatin (LIPITOR) 80 MG tablet TAKE 1 TABLET BY MOUTH DAILY AT 6 PM. 90 tablet 3   Coenzyme Q10 (CO Q 10) 100 MG CAPS Take 100 mg by mouth every evening.     dexamethasone (DECADRON) 1 MG tablet Take 3 tablets (3 mg  total) by mouth daily with breakfast for 7 days, THEN 2 tablets (2 mg total) daily with breakfast for 7 days, THEN 1 tablet (1 mg total) daily with breakfast for 7 days. 42 tablet 0   diclofenac Sodium (VOLTAREN) 1 % GEL Apply 1 Application topically daily as needed (Knee pain).     ezetimibe (ZETIA) 10 MG tablet TAKE 1 TABLET BY MOUTH EVERY DAY 90 tablet 0   folic acid (FOLVITE) 1 MG tablet Take 1 tablet (1 mg total) by mouth daily. 90 tablet 1    furosemide (LASIX) 20 MG tablet Once daily only as needed for swelling of the lower extremities 30 tablet 6   irbesartan (AVAPRO) 150 MG tablet Take 1 tablet (150 mg total) by mouth daily. 90 tablet 3   levETIRAcetam (KEPPRA) 500 MG tablet TAKE 1 TABLET BY MOUTH TWICE A DAY 180 tablet 1   metoprolol tartrate (LOPRESSOR) 25 MG tablet TAKE 1 TABLET BY MOUTH TWICE A DAY 90 tablet 3   nitroGLYCERIN (NITROSTAT) 0.4 MG SL tablet Place 1 tablet (0.4 mg total) under the tongue every 5 (five) minutes x 3 doses as needed for chest pain. 25 tablet 2   ondansetron (ZOFRAN) 8 MG tablet Take 1 tablet (8 mg total) by mouth every 8 (eight) hours as needed for nausea or vomiting. Starting day 3 after chemotherapy (Patient taking differently: Take 8 mg by mouth as needed for nausea or vomiting.) 30 tablet 2   pantoprazole (PROTONIX) 40 MG tablet TAKE 1 TABLET BY MOUTH EVERY DAY 90 tablet 1   prochlorperazine (COMPAZINE) 10 MG tablet TAKE 1 TABLET BY MOUTH EVERY 6 HOURS AS NEEDED 30 tablet 2   traMADol (ULTRAM) 50 MG tablet Take 50 mg by mouth daily as needed for moderate pain.     No current facility-administered medications for this encounter.    REVIEW OF SYSTEMS:  Notable for that above.   PHYSICAL EXAM:  vitals were not taken for this visit.   General: Alert and oriented, in no acute distress *** HEENT: Head is normocephalic. Extraocular movements are intact. Oropharynx is clear. Neck: Neck is supple, no palpable cervical or supraclavicular lymphadenopathy. Heart: Regular in rate and rhythm with no murmurs, rubs, or gallops. Chest: Clear to auscultation bilaterally, with no rhonchi, wheezes, or rales. Abdomen: Soft, nontender, nondistended, with no rigidity or guarding. Extremities: No cyanosis or edema. Lymphatics: see Neck Exam Skin: No concerning lesions. Musculoskeletal: symmetric strength and muscle tone throughout. Neurologic: Cranial nerves II through XII are grossly intact. No obvious focalities.  Speech is fluent. Coordination is intact. Psychiatric: Judgment and insight are intact. Affect is appropriate.   ECOG = ***  0 - Asymptomatic (Fully active, able to carry on all predisease activities without restriction)  1 - Symptomatic but completely ambulatory (Restricted in physically strenuous activity but ambulatory and able to carry out work of a light or sedentary nature. For example, light housework, office work)  2 - Symptomatic, <50% in bed during the day (Ambulatory and capable of all self care but unable to carry out any work activities. Up and about more than 50% of waking hours)  3 - Symptomatic, >50% in bed, but not bedbound (Capable of only limited self-care, confined to bed or chair 50% or more of waking hours)  4 - Bedbound (Completely disabled. Cannot carry on any self-care. Totally confined to bed or chair)  5 - Death   Santiago Glad MM, Creech RH, Tormey DC, et al. [phone]). "Toxicity and response criteria of the  [place] Cooperative Oncology Group". Am. Evlyn Clines. Oncol. 5 (6): 649-55   LABORATORY DATA:  Lab Results  Component Value Date   WBC 7.8 09/28/2022   HGB 11.9 (L) 09/28/2022   HCT 33.6 (L) 09/28/2022   MCV 99.4 09/28/2022   PLT 292 09/28/2022   CMP     Component Value Date/Time   NA 132 (L) 09/28/2022 1045   NA 138 07/19/2022 1620   K 3.6 09/28/2022 1045   CL 99 09/28/2022 1045   CO2 24 09/28/2022 1045   GLUCOSE 147 (H) 09/28/2022 1045   BUN 22 09/28/2022 1045   BUN 8 07/19/2022 1620   CREATININE 0.79 09/28/2022 1045   CALCIUM 8.8 (L) 09/28/2022 1045   PROT 6.4 (L) 09/28/2022 1045   PROT 7.2 12/08/2020 1620   ALBUMIN 3.6 09/28/2022 1045   ALBUMIN 4.4 12/08/2020 1620   AST 30 09/28/2022 1045   ALT 31 09/28/2022 1045   ALKPHOS 72 09/28/2022 1045   BILITOT 0.8 09/28/2022 1045   GFRNONAA >60 09/28/2022 1045   GFRAA 102 03/13/2020 1204         RADIOGRAPHY: CT CHEST ABDOMEN PELVIS W CONTRAST  Result Date: 09/25/2022 CLINICAL DATA:  Non-small cell  lung cancer. Adenocarcinoma. Restaging. * Tracking Code: BO *. EXAM: CT CHEST, ABDOMEN, AND PELVIS WITH CONTRAST TECHNIQUE: Multidetector CT imaging of the chest, abdomen and pelvis was performed following the standard protocol during bolus administration of intravenous contrast. RADIATION DOSE REDUCTION: This exam was performed according to the departmental dose-optimization program which includes automated exposure control, adjustment of the mA and/or kV according to patient size and/or use of iterative reconstruction technique. CONTRAST:  OMNIPAQUE IOHEXOL 300 MG/ML  SOLN COMPARISON:  07/22/2022 FINDINGS: CT CHEST FINDINGS Cardiovascular: Heart size appears within normal limits. Aortic atherosclerosis. Coronary artery calcifications. No pericardial effusion. Mediastinum/Nodes: Thyroid gland, trachea and esophagus are unremarkable. Index left hilar lymph node has progressed from the previous exam now measuring 2.5 x 2.1 cm, image 31/2. Previously 1.3 x 0.9 cm. Calcified mediastinal lymph nodes. Lungs/Pleura: No pleural effusion. Nodule within the posterior left upper lobe abutting the oblique fissure measures 2.3 x 1.1 cm, image 64/4. On the previous exam this measured 2.5 x 1.3 cm. Additional tiny lung nodules are unchanged including 3 mm lingular nodule, image 82/4. No new lung nodules identified. Musculoskeletal: No chest wall mass or suspicious bone lesions identified. CT ABDOMEN PELVIS FINDINGS Hepatobiliary: No focal liver abnormality is seen. Status post cholecystectomy. No biliary dilatation. Pancreas: Unremarkable. No pancreatic ductal dilatation or surrounding inflammatory changes. Spleen: Calcified granulomas noted.  Spleen otherwise unremarkable. Adrenals/Urinary Tract: Bilateral adrenal gland adenomas are again seen and appear unchanged from previous exam. The largest is in the left adrenal gland measuring 3.6 by 2.2 cm, image 61/2. No follow-up imaging recommended. Nonobstructing stone within  the left kidney measures 1.2 cm. Bilateral fluid attenuating kidney lesions are identified compatible with multiple cysts. The largest is in the upper pole of left kidney measuring 6.5 cm. No hydronephrosis identified bilaterally. Bladder appears normal. Stomach/Bowel: Stomach is within normal limits. Appendix appears normal. No evidence of bowel wall thickening, distention, or inflammatory changes. Vascular/Lymphatic: Aortic atherosclerosis. No enlarged abdominal or pelvic lymph nodes. Reproductive: Uterus and bilateral adnexa are unremarkable. Other: No ascites or focal fluid collections. Musculoskeletal: No acute or significant osseous findings. IMPRESSION: 1. Interval increase in size of left hilar lymph node compatible with progression of disease. 2. The posterior left upper lobe lung nodule is not significantly changed in size in the  interval. 3. No signs of metastatic disease to the abdomen or pelvis. 4. Nonobstructing left renal calculus. 5.  Aortic Atherosclerosis (ICD10-I70.0). Electronically Signed   By: Signa Kell M.D.   On: 09/25/2022 10:26   MR BRAIN W WO CONTRAST  Result Date: 09/17/2022 CLINICAL DATA:  Provided history: Brain/CNS neoplasm, assess treatment response. Metastatic cancer to brain. Additional history obtained from electronic MEDICAL RECORD NUMBERHistory of metastatic lung cancer and prior SRS. EXAM: MRI HEAD WITHOUT AND WITH CONTRAST TECHNIQUE: Multiplanar, multiecho pulse sequences of the brain and surrounding structures were obtained without and with intravenous contrast. CONTRAST:  7.5 mL Vueway intravenous contrast. COMPARISON:  Prior brain MRI examinations 06/09/2022 and earlier. FINDINGS: Brain: Mild generalized parenchymal atrophy. Multiple enhancing intracranial metastases are again demonstrated, as follows. 14 x 13 mm peripherally enhancing lesion within the mid left frontal lobe, increased in size (previously measuring 9 x 9 mm) (series 14, image 119). Moderate surrounding  edema has increased. 11 mm peripherally enhancing lesion centered within the right postcentral gyrus, increased in size (previously measuring 8 mm) (series 14, image 116). Mild surrounding edema has subtly increased. Punctate enhancing lesion within the medial right parietal lobe, decreased in size (previously 3 mm) (series 14, image 110). No significant surrounding edema. 18 x 13 mm peripherally enhancing lesion within the left parietal lobe, increased in size (previously measuring 13 x 10 mm) (series 14, image 109). Moderate surrounding edema has increased. 24 x 18 mm peripherally enhancing lesion within the right occipital lobe, increased in size (previously measuring 19 x 13 mm) (series 14, image 74). Moderate surrounding edema is similar to the prior exam. 17 x 11 mm peripherally enhancing lesion within the medial left cerebellar hemisphere/left aspect of the cerebellar vermis (previously 11 x 8 mm) (series 14, image 40). Mild surrounding edema has not significantly changed. A previously demonstrated punctate enhancing lesion within the posteromedial left frontal lobe is no longer appreciated. A previously demonstrated 2 mm lesion within the right cerebellar hemisphere no longer has appreciable enhancement. No new intracranial metastasis is identified. As before, there are chronic blood products associated with some of the metastases. Mild multifocal T2 FLAIR hyperintense signal abnormality elsewhere within the cerebral white matter, nonspecific but compatible chronic small vessel ischemic disease. There is no acute infarct. No extra-axial fluid collection. No midline shift. Vascular: Maintained flow voids within the proximal large arterial vessels. Skull and upper cervical spine: No focal suspicious marrow lesion. Incompletely assessed cervical spondylosis. Sinuses/Orbits: No mass or acute finding within the imaged orbits. Minimal mucosal thickening within the bilateral ethmoid sinuses. IMPRESSION: 1. Interval  increase in size of known metastases within the left frontal lobe, right parietal lobe, left parietal lobe, right occipital lobe and left cerebellar hemisphere/cerebellar vermis as described. 2. A punctate enhancing metastasis within the medial right parietal lobe has decreased in size. 3. Previously demonstrated metastases within the posteromedial left frontal lobe and right cerebellar hemisphere no longer show enhancement. 4. No new intracranial metastasis identified. Electronically Signed   By: Jackey Loge D.O.   On: 09/17/2022 12:12      IMPRESSION/PLAN:***    On date of service, in total, I spent *** minutes on this encounter. Patient was seen in person.   __________________________________________   Lonie Peak, MD  This document serves as a record of services personally performed by Lonie Peak, MD. It was created on her behalf by Neena Rhymes, a trained medical scribe. The creation of this record is based on the scribe's personal observations and the  provider's statements to them. This document has been checked and approved by the attending provider.

## 2022-10-05 ENCOUNTER — Telehealth: Payer: Self-pay

## 2022-10-05 ENCOUNTER — Ambulatory Visit
Admission: RE | Admit: 2022-10-05 | Discharge: 2022-10-05 | Disposition: A | Discharge status: Home / Self Care | Payer: Medicare Other | Source: Ambulatory Visit | Attending: Radiation Oncology | Admitting: Radiation Oncology

## 2022-10-05 ENCOUNTER — Encounter: Payer: Self-pay | Admitting: Radiation Oncology

## 2022-10-05 DIAGNOSIS — C3412 Malignant neoplasm of upper lobe, left bronchus or lung: Secondary | ICD-10-CM

## 2022-10-05 NOTE — Progress Notes (Signed)
Thoracic Location of Tumor / Histology: Adenocarcinoma of left lung, stage 4   CT CHEST ABDOMEN PELVIS W CONTRAST 07/22/2022  IMPRESSION: 1. Similar size of a posterior left upper lobe pulmonary nodule. 2. Developing left infrahilar adenopathy, suspicious for isolated nodal metastasis. This could either be re-evaluated on contrast-enhanced chest CT follow-up at 3 months or more entirely characterized with PET. 3. Otherwise, no evidence of metastatic disease. 4. Incidental findings, including: Hepatic steatosis. Left nephrolithiasis. Bilateral adrenal adenomas.  Patient presented with symptoms of: found on CT  Biopsies revealed:  01/15/2022  Clinical History: Mass of left lung  Specimen Submitted:  C. LUNG, LUL, LAVAGE:   FINAL MICROSCOPIC DIAGNOSIS:  - Malignant cells consistent with adenocarcinoma   SPECIMEN ADEQUACY:  Satisfactory for evaluation    01/15/2022  FINAL MICROSCOPIC DIAGNOSIS:  A.  LEFT LUNG, UPPER LOBE, FINE NEEDLE ASPIRATION:  - Malignant  - Adenocarcinoma   B.  LEFT LUNG, UPPER LOBE, BRUSHING:  - Suspicious  - Rare atypical large cells suspicious for tumor  - Background pulmonary macrophages with scattered benign bronchial cells  and inflammatory cells   COMMENT:   Three immunohistochemical stains are performed with adequate control on  the large atypical cells present within the cellblock of the fine-needle  aspirate (A).  The cells are positive for the pulmonary adeno marker  TTF-1.  They are negative for the squamous marker p40 and the  histiocytic marker CD68.  The cytohistomorphology and this  immunohistochemical pattern support the above diagnosis.    Tobacco/Marijuana/Snuff/ETOH use: yes, still smokes occassionally  Past/Anticipated interventions by cardiothoracic surgery, if any: [id number] Video Bronchoscopy with Robotic Assisted Bronchoscopic Navigation    Date of Operation: 01/15/2022    Pre-op Diagnosis: LUL pulmonary nodule and  mediastinal LAD   Post-op Diagnosis: LUL pulmonary nodule and mediastinal LAD   Surgeon: Judithann Graves  Past/Anticipated interventions by medical oncology, if any:  Oncology History  Adenocarcinoma of left lung, stage 4 (HCC)  02/08/2022 Initial Diagnosis    Adenocarcinoma of left lung, stage 4 (HCC)    02/08/2022 Cancer Staging    Staging form: Lung, AJCC 8th Edition - Clinical: Stage IVB (cT1c, cN2, cM1c) - Signed by Si Gaul, MD on 02/08/2022    03/01/2022 -  Chemotherapy    Patient is on Treatment Plan : LUNG Carboplatin (5) + Pemetrexed (500) + Pembrolizumab (200) D1 q21d Induction x 4 cycles / Maintenance Pemetrexed (500) + Pembrolizumab (200) D1 q21d      Si Gaul, MD 09/28/2022  DIAGNOSIS: Stage IV (T1c, N2, M1 C) non-small cell lung cancer, adenocarcinoma with positive KRAS G12C mutation diagnosed in September 2023 and presented with left upper lobe lung mass in addition to left hilar and mediastinal lymphadenopathy as well as innumerable brain metastasis.    ASSESSMENT AND PLAN: This is a very pleasant 72 years old white female with Stage IV (T1c, N2, M1 C) non-small cell lung cancer, adenocarcinoma with positive KRAS G12C mutation diagnosed in September 2023 and presented with left upper lobe lung mass in addition to left hilar and mediastinal lymphadenopathy as well as innumerable brain metastasis. The patient is scheduled for SRS to multiple brain metastasis on February 24, 2022. The patient is currently undergoing systemic chemotherapy with carboplatin for AUC of 5, Alimta 500 Mg/M2 and Keytruda 200 Mg IV every 3 weeks status post 10 cycles.  Starting from cycle #5 she is on maintenance treatment with Alimta and Keytruda every 3 weeks. The patient has been tolerating this treatment fairly well with  no concerning adverse effects. She had repeat CT scan of the chest, abdomen and pelvis performed recently.  I personally and independently reviewed the scan images and  discussed the result and showed the images to the patient and her daughter-in-law today. Her scan showed stable disease except for the interval increase in the left hilar lymph node. I discussed with the patient several options for management of her condition including continuation of the current treatment and radiotherapy to the enlarging left hilar mass versus switching her treatment to K-ras G12 C mutation inhibitor.  After discussion of the 2 option the patient would like to continue on the current treatment with maintenance Alimta and Keytruda in addition to the palliative radiotherapy to the left hilar nodule. I will refer her to Dr. Basilio Cairo for discussion of the radiotherapy portion. If the patient develop any additional disease progression in the future, I will switch her treatment to [place] Warden/ranger). For the constipation, she will use MiraLAX and other laxatives for now. For the swelling of the lower extremity, this significantly improved with the treatment of Lasix. She will come back for follow-up visit in 3 weeks for evaluation before the next cycle of her treatment. The patient was advised to call immediately if she has any concerning symptoms in the interval. The patient voices understanding of current disease status and treatment options and is in agreement with the current care plan.  Signs/Symptoms Weight changes, if any: yes, some weight loss to note Respiratory complaints, if any: yes, slight shortness of breath with moderate activity at times Hemoptysis, if any: no Pain issues, if any: none to report  SAFETY ISSUES: Prior radiation? Yes, SRS on 02-24-22  Pacemaker/ICD? no  Possible current pregnancy?no Is the patient on methotrexate? no  Current Complaints / other details:  Pt has no major concerns about radiation at this time. She still continues to endorse dizziness, trouble with writing, blurred vision in her left eye, and some memory issues, related to her brain  involvement. Her daughter is by her side and very supportive.

## 2022-10-05 NOTE — Telephone Encounter (Signed)
Pt family called RN back and RN was able to obtain nurse evaluation and meaningful use information successfully. Consult note completed and routed to Dr. Basilio Cairo and Quitman Livings PA for review. Pt had no major concerns or questions.

## 2022-10-05 NOTE — Telephone Encounter (Signed)
Rn attempted to call pt to obtain meaningful use and nurse evaluation information without success. Rn left a message on pt voicemail. Rn will call pt back later as well closer to the her 1030 nurse evaluation time.

## 2022-10-07 NOTE — Progress Notes (Deleted)
Cardiology Clinic Note   Patient Name: Kimberly Huang Date of Encounter: 10/07/2022  Primary Care Provider:  Patient, No Pcp Per Primary Cardiologist:  Peter Swaziland, MD  Patient Profile    ***  Past Medical History    Past Medical History:  Diagnosis Date   Arthritis    knee, hands   CAD S/P PCI OM1 99%-0%; Plan Staged PCI mRCA 90%. 02/19/2017   1. Severe 2 vessel obstructive CAD    - 99% thrombotic occlusion of first OM. This is a bifurcating vessel.     - 90% mid RCA-segmental 2. Good overall LV dysfunction with lateral HK 3. Moderately elevated LVEDP 4. Successful stenting of the first OM with DES. Distal embolization into the distal lateral OM branch.   Plan: DAPT for one year. Will treat with IV Aggrastat for 18 hours. Start high dose statin, beta blocker. IV Ntg for BP control acutely. Plan for stage PCI of RCA on Monday 10/22 if no complication.   Essential hypertension 02/21/2017   Hyperlipidemia with target LDL less than 70 02/21/2017   Lung cancer (HCC)    Presence of drug-eluting stent in L Cx: OM1 (Xience SIERRA DES 3.5 x 15) 02/21/2017   STEMI involving left circumflex coronary artery (HCC) 02/19/2017   Tobacco abuse 02/19/2017   Past Surgical History:  Procedure Laterality Date   BRONCHIAL BIOPSY  01/15/2022   Procedure: BRONCHIAL BIOPSIES;  Surgeon: Omar Person, MD;  Location: [facility] ENDOSCOPY;  Service: Pulmonary;;   BRONCHIAL BRUSHINGS  01/15/2022   Procedure: BRONCHIAL BRUSHINGS;  Surgeon: Omar Person, MD;  Location: [facility] ENDOSCOPY;  Service: Pulmonary;;   BRONCHIAL NEEDLE ASPIRATION BIOPSY  01/15/2022   Procedure: BRONCHIAL NEEDLE ASPIRATION BIOPSIES;  Surgeon: Omar Person, MD;  Location: [facility] ENDOSCOPY;  Service: Pulmonary;;   CARDIAC CATHETERIZATION  02/21/2017   CHOLECYSTECTOMY     CORONARY STENT INTERVENTION N/A 02/19/2017   Procedure: CORONARY STENT INTERVENTION;  Surgeon: Swaziland, Peter M, MD;  Location: [facility] INVASIVE CV LAB;  Service:  Cardiovascular;  Laterality: N/A;   CORONARY STENT INTERVENTION N/A 02/21/2017   Procedure: CORONARY STENT INTERVENTION;  Surgeon: Lyn Records, MD;  Location: [facility] INVASIVE CV LAB;  Service: Cardiovascular;  Laterality: N/A;   FIDUCIAL MARKER PLACEMENT  01/15/2022   Procedure: FIDUCIAL MARKER PLACEMENT;  Surgeon: Omar Person, MD;  Location: [facility] ENDOSCOPY;  Service: Pulmonary;;   FINE NEEDLE ASPIRATION  01/15/2022   Procedure: FINE NEEDLE ASPIRATION;  Surgeon: Omar Person, MD;  Location: [facility] ENDOSCOPY;  Service: Pulmonary;;   HEMOSTASIS CONTROL  01/15/2022   Procedure: HEMOSTASIS CONTROL;  Surgeon: Omar Person, MD;  Location: [facility] ENDOSCOPY;  Service: Pulmonary;;   KNEE ARTHROSCOPY Right 2010   KNEE ARTHROSCOPY WITH SUBCHONDROPLASTY Left 01/09/2020   Procedure: Left knee arthroscopy,partial medial meniscectomy, debridement, subchondroplasty left proximal tibia;  Surgeon: Jene Every, MD;  Location: [name];  Service: Orthopedics;  Laterality: Left;   LEFT HEART CATH AND CORONARY ANGIOGRAPHY N/A 02/19/2017   Procedure: LEFT HEART CATH AND CORONARY ANGIOGRAPHY;  Surgeon: Swaziland, Peter M, MD;  Location: [facility] INVASIVE CV LAB;  Service: Cardiovascular;  Laterality: N/A;   TONSILLECTOMY     TUBAL LIGATION     VIDEO BRONCHOSCOPY WITH ENDOBRONCHIAL ULTRASOUND N/A 01/15/2022   Procedure: VIDEO BRONCHOSCOPY WITH ENDOBRONCHIAL ULTRASOUND;  Surgeon: Omar Person, MD;  Location: [facility] ENDOSCOPY;  Service: Pulmonary;  Laterality: N/A;   VIDEO BRONCHOSCOPY WITH RADIAL ENDOBRONCHIAL ULTRASOUND  01/15/2022   Procedure: RADIAL ENDOBRONCHIAL ULTRASOUND;  Surgeon: Felisa Bonier  M, MD;  Location: [facility] ENDOSCOPY;  Service: Pulmonary;;    Allergies  Allergies  Allergen Reactions   Codeine Nausea Only    History of Present Illness    72 year old female with history of hypertension, hyperlipidemia, CAD, with history of posterior STEMI in 2018 with intervention using DES to her left circumflex  with residual RCA disease that was intervened 48 hours later, 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  Home Medications    Current Outpatient Medications  Medication Sig Dispense Refill   acetaminophen (TYLENOL) 500 MG tablet Take 500 mg by mouth as needed for moderate pain.     aspirin 81 MG chewable tablet Chew 1 tablet (81 mg total) by mouth daily.     atorvastatin (LIPITOR) 80 MG tablet TAKE 1 TABLET BY MOUTH DAILY AT 6 PM. 90 tablet 3   Coenzyme Q10 (CO Q 10) 100 MG CAPS Take 100 mg by mouth every evening.     dexamethasone (DECADRON) 1 MG tablet Take 3 tablets (3 mg total) by mouth daily with breakfast for 7 days, THEN 2 tablets (2 mg total) daily with breakfast for 7 days, THEN 1 tablet (1 mg total) daily with breakfast for 7 days. 42 tablet 0   diclofenac Sodium (VOLTAREN) 1 % GEL Apply 1 Application topically daily as needed (Knee pain).     ezetimibe (ZETIA) 10 MG tablet TAKE 1 TABLET BY MOUTH EVERY DAY 90 tablet 0   folic acid (FOLVITE) 1 MG tablet Take 1 tablet (1 mg total) by mouth daily. 90 tablet 1   furosemide (LASIX) 20 MG tablet Once daily only as needed for swelling of the lower extremities 30 tablet 6   irbesartan (AVAPRO) 150 MG tablet Take 1 tablet (150 mg total) by mouth daily. 90 tablet 3   levETIRAcetam (KEPPRA) 500 MG tablet TAKE 1 TABLET BY MOUTH TWICE A DAY 180 tablet 1   metoprolol tartrate (LOPRESSOR) 25 MG tablet TAKE 1 TABLET BY MOUTH TWICE A DAY 90 tablet 3   nitroGLYCERIN (NITROSTAT) 0.4 MG SL tablet Place 1 tablet (0.4 mg total) under the tongue every 5 (five) minutes x 3 doses as needed for chest pain. 25 tablet 2   ondansetron (ZOFRAN) 8 MG tablet Take 1 tablet (8 mg total) by mouth every 8 (eight) hours as needed for nausea or vomiting. Starting day 3 after chemotherapy (Patient taking differently: Take 8 mg by mouth as needed for nausea or vomiting.) 30 tablet 2   pantoprazole (PROTONIX) 40 MG  tablet TAKE 1 TABLET BY MOUTH EVERY DAY 90 tablet 1   prochlorperazine (COMPAZINE) 10 MG tablet TAKE 1 TABLET BY MOUTH EVERY 6 HOURS AS NEEDED 30 tablet 2   traMADol (ULTRAM) 50 MG tablet Take 50 mg by mouth daily as needed for moderate pain.     No current facility-administered medications for this visit.     Family History    Family History  Problem Relation Age of Onset   Diabetes Mother    Lung cancer Father    Heart attack Sister    She indicated that her mother is deceased. She indicated that her father is deceased. She indicated that her sister is alive. She indicated that her brother is alive. She indicated that her maternal grandmother is deceased. She indicated that her maternal grandfather is deceased. She indicated that her paternal grandmother is deceased. She indicated that her paternal grandfather is deceased. She indicated that all of  her four others are alive.  Social History    Social History   Socioeconomic History   Marital status: Married    Spouse name: Not on file   Number of children: 3   Years of education: Not on file   Highest education level: Not on file  Occupational History   Not on file  Tobacco Use   Smoking status: Some Days    Packs/day: 1.50    Years: 50.00    Additional pack years: 0.00    Total pack years: 75.00    Types: Cigarettes   Smokeless tobacco: Never   Tobacco comments:    Trying to quit, currently smokes 5-6 cigarettes per day 01/21/2022  Vaping Use   Vaping Use: Never used  Substance and Sexual Activity   Alcohol use: Yes    Comment: occasional   Drug use: Never   Sexual activity: Not Currently    Birth control/protection: Surgical    Comment: Hysterectomy//Tubal Ligation  Other Topics Concern   Not on file  Social History Narrative   Family runs the [employer].   Social Determinants of Health   Financial Resource Strain: Not on file  Food Insecurity: No Food Insecurity (10/05/2022)   Hunger Vital Sign     Worried About Running Out of Food in the Last Year: Never true    Ran Out of Food in the Last Year: Never true  Transportation Needs: No Transportation Needs (10/05/2022)   PRAPARE - Administrator, Civil Service (Medical): No    Lack of Transportation (Non-Medical): No  Physical Activity: Not on file  Stress: Not on file  Social Connections: Not on file  Intimate Partner Violence: Not At Risk (10/05/2022)   Humiliation, Afraid, Rape, and Kick questionnaire    Fear of Current or Ex-Partner: No    Emotionally Abused: No    Physically Abused: No    Sexually Abused: No     Review of Systems    General:  No chills, fever, night sweats or weight changes.  Cardiovascular:  No chest pain, dyspnea on exertion, edema, orthopnea, palpitations, paroxysmal nocturnal dyspnea. Dermatological: No rash, lesions/masses Respiratory: No cough, dyspnea Urologic: No hematuria, dysuria Abdominal:   No nausea, vomiting, diarrhea, bright red blood per rectum, melena, or hematemesis Neurologic:  No visual changes, wkns, changes in mental status. All other systems reviewed and are otherwise negative except as noted above.     Physical Exam    VS:  There were no vitals taken for this visit. , BMI There is no height or weight on file to calculate BMI.     GEN: Well nourished, well developed, in no acute distress. HEENT: normal. Neck: Supple, no JVD, carotid bruits, or masses. Cardiac: RRR, no murmurs, rubs, or gallops. No clubbing, cyanosis, edema.  Radials/DP/PT 2+ and equal bilaterally.  Respiratory:  Respirations regular and unlabored, clear to auscultation bilaterally. GI: Soft, nontender, nondistended, BS + x 4. MS: no deformity or atrophy. Skin: warm and dry, no rash. Neuro:  Strength and sensation are intact. Psych: Normal affect.  Accessory Clinical Findings    ECG personally reviewed by me today- *** - No acute changes  Lab Results  Component Value Date   WBC 7.8 09/28/2022    HGB 11.9 (L) 09/28/2022   HCT 33.6 (L) 09/28/2022   MCV 99.4 09/28/2022   PLT 292 09/28/2022   Lab Results  Component Value Date   CREATININE 0.79 09/28/2022   BUN 22 09/28/2022   NA  132 (L) 09/28/2022   K 3.6 09/28/2022   CL 99 09/28/2022   CO2 24 09/28/2022   Lab Results  Component Value Date   ALT 31 09/28/2022   AST 30 09/28/2022   ALKPHOS 72 09/28/2022   BILITOT 0.8 09/28/2022   Lab Results  Component Value Date   CHOL 132 07/19/2022   HDL 60 07/19/2022   LDLCALC 53 07/19/2022   TRIG 106 07/19/2022   CHOLHDL 2.2 07/19/2022    Lab Results  Component Value Date   HGBA1C 5.5 02/19/2017    Review of Prior Studies:  Cath 02/19/2017 Conclusion      Prox LAD to Mid LAD lesion, 20 %stenosed. Prox RCA to Mid RCA lesion, 90 %stenosed. Prox Cx to Mid Cx lesion, 30 %stenosed. The left ventricular systolic function is normal. LV end diastolic pressure is moderately elevated. The left ventricular ejection fraction is 50-55% by visual estimate. 1st Mrg lesion, 99 %stenosed. A STENT SIERRA 3.50 X 15 MM drug eluting stent was successfully placed. Post intervention, there is a 0% residual stenosis. Lat 1st Mrg lesion, 100 %stenosed.   1. Severe 2 vessel obstructive CAD    - 99% thrombotic occlusion of first OM. This is a bifurcating vessel.     - 90% mid RCA-segmental 2. Good overall LV dysfunction with lateral HK 3. Moderately elevated LVEDP 4. Successful stenting of the first OM with DES. Distal embolization into the distal lateral OM branch.   Plan: DAPT for one year. Will treat with IV Aggrastat for 18 hours. Start high dose statin, beta blocker. IV Ntg for BP control acutely. Plan for stage PCI of RCA on Monday if no complication.        Cath 02/21/2017 Conclusion    Successful stent implantation in the proximal to mid RCA reducing a segmental 90% stenosis to 0% with TIMI grade 3 flow. Stent used was a 3.0 x 26 Onyx post dilated to 14 atm 2.    RECOMMENDATIONS:   Continue dual antiplatelet therapy (aspirin and Brilinta) Discharge per discretion of IC team.   Assessment & Plan   1.  ***     {Are you ordering a CV Procedure (e.g. stress test, cath, DCCV, TEE, etc)?   Press F2        :[id number]}   Signed, Bettey Mare. Liborio Nixon, ANP, AACC   10/07/2022 2:06 PM      Office [phone] Fax [phone]  Notice: This dictation was prepared with Dragon dictation along with smaller phrase technology. Any transcriptional errors that result from this process are unintentional and may not be corrected upon review.

## 2022-10-13 ENCOUNTER — Telehealth: Payer: Self-pay | Admitting: Internal Medicine

## 2022-10-13 NOTE — Telephone Encounter (Signed)
Called patient regarding upcoming June and July appointments, left a voicemail. 

## 2022-10-14 NOTE — Progress Notes (Unsigned)
[facility] OFFICE PROGRESS NOTE  Patient, No Pcp Per No address on file  DIAGNOSIS: Stage IV (T1c, N2, M1 C) non-small cell lung cancer, adenocarcinoma with positive KRAS G12C mutation diagnosed in September 2023 and presented with left upper lobe lung mass in addition to left hilar and mediastinal lymphadenopathy as well as innumerable brain metastasis.    PDL1 Expression: 93%   PRIOR THERAPY: 1) SRS to multiple brain metastasis under the care of Dr. Basilio Cairo on February 24, 2022   CURRENT THERAPY: 1) Palliative systemic chemotherapy with carboplatin for AUC of 5, Alimta 500 Mg/M2 and Keytruda 200 Mg IV every 3 weeks. Status post 9 cycles. Starting from cycle #5, she started maintenance alimta and keytruda IV every 3 weeks  2) UHRT to the left lung under the care of Dr. Basilio Cairo, last dose 11/22/22.   INTERVAL HISTORY: Kimberly Huang 72 y.o. female returns to the clinic today for a follow-up visit accompanied by her daughter.  The patient was last seen 3 weeks ago by Dr. Arbutus Ped.  At that point time, she had a restaging CT scan which showed a left hilar nodule.  Dr. Arbutus Ped discussed radiation versus changing to second line treatment with KRAS targeted treatment.  The patient opted to see radiation oncology.  She saw Dr. Basilio Cairo who is arranging for [facility]. Her simulation is scheduled for 6/24 and she is expected to start the second week of July. Her last day radiation is scheduled for 11/22/22.   Since last being seen she denies any major changes in her health.  At her last appointment, they asked Dr. Arbutus Ped about her discharge from both of her eyes first thing in the morning.  They are using washcloths.  They are using Refresh Tears.  Dr. Arbutus Ped discussed that this could occur with her Alimta. No eye erythema, discharge at this time, or swelling. The patient does not have a PCP. She denies any fever, chills, night sweats.  She lost some weight since last being seen.  She states food  tastes "okay" but she has some early satiety.  She is drinking supplemental drinks daily.  Discussed appetite stimulants but she would like to think about her options at this time.  She is previously seeing a member the nutritionist team.    She denies any chest pain or hemoptysis. She denies any significant dyspnea exertion or cough.  She has not had any recent nausea or vomiting.  She has Compazine and Zofran if needed at home. She follows closely with neuro-oncology for history of metastatic disease to the brain.  She saw oncology recently.  She is currently on a Decadron taper and is currently taking 1 mg daily.  She is prescribed tramadol by her orthopedic physician for chronic pain.  She is here today for evaluation and repeat blood work before undergoing cycle #12.    MEDICAL HISTORY: Past Medical History:  Diagnosis Date   Arthritis    knee, hands   CAD S/P PCI OM1 99%-0%; Plan Staged PCI mRCA 90%. 02/19/2017   1. Severe 2 vessel obstructive CAD    - 99% thrombotic occlusion of first OM. This is a bifurcating vessel.     - 90% mid RCA-segmental 2. Good overall LV dysfunction with lateral HK 3. Moderately elevated LVEDP 4. Successful stenting of the first OM with DES. Distal embolization into the distal lateral OM branch.   Plan: DAPT for one year. Will treat with IV Aggrastat for 18 hours. Start high dose statin,  beta blocker. IV Ntg for BP control acutely. Plan for stage PCI of RCA on Monday 10/22 if no complication.   Essential hypertension 02/21/2017   Hyperlipidemia with target LDL less than 70 02/21/2017   Lung cancer (HCC)    Presence of drug-eluting stent in L Cx: OM1 (Xience SIERRA DES 3.5 x 15) 02/21/2017   STEMI involving left circumflex coronary artery (HCC) 02/19/2017   Tobacco abuse 02/19/2017    ALLERGIES:  is allergic to codeine.  MEDICATIONS:  Current Outpatient Medications  Medication Sig Dispense Refill   acetaminophen (TYLENOL) 500 MG tablet Take 500 mg by mouth  as needed for moderate pain.     aspirin 81 MG chewable tablet Chew 1 tablet (81 mg total) by mouth daily.     atorvastatin (LIPITOR) 80 MG tablet TAKE 1 TABLET BY MOUTH DAILY AT 6 PM. 90 tablet 3   Coenzyme Q10 (CO Q 10) 100 MG CAPS Take 100 mg by mouth every evening.     dexamethasone (DECADRON) 1 MG tablet Take 3 tablets (3 mg total) by mouth daily with breakfast for 7 days, THEN 2 tablets (2 mg total) daily with breakfast for 7 days, THEN 1 tablet (1 mg total) daily with breakfast for 7 days. 42 tablet 0   diclofenac Sodium (VOLTAREN) 1 % GEL Apply 1 Application topically daily as needed (Knee pain).     ezetimibe (ZETIA) 10 MG tablet TAKE 1 TABLET BY MOUTH EVERY DAY 90 tablet 0   folic acid (FOLVITE) 1 MG tablet Take 1 tablet (1 mg total) by mouth daily. 90 tablet 1   furosemide (LASIX) 20 MG tablet Once daily only as needed for swelling of the lower extremities 30 tablet 6   irbesartan (AVAPRO) 150 MG tablet Take 1 tablet (150 mg total) by mouth daily. 90 tablet 3   levETIRAcetam (KEPPRA) 500 MG tablet TAKE 1 TABLET BY MOUTH TWICE A DAY 180 tablet 1   metoprolol tartrate (LOPRESSOR) 25 MG tablet TAKE 1 TABLET BY MOUTH TWICE A DAY 90 tablet 3   nitroGLYCERIN (NITROSTAT) 0.4 MG SL tablet Place 1 tablet (0.4 mg total) under the tongue every 5 (five) minutes x 3 doses as needed for chest pain. 25 tablet 2   ondansetron (ZOFRAN) 8 MG tablet Take 1 tablet (8 mg total) by mouth every 8 (eight) hours as needed for nausea or vomiting. Starting day 3 after chemotherapy (Patient taking differently: Take 8 mg by mouth as needed for nausea or vomiting.) 30 tablet 2   pantoprazole (PROTONIX) 40 MG tablet TAKE 1 TABLET BY MOUTH EVERY DAY 90 tablet 1   prochlorperazine (COMPAZINE) 10 MG tablet TAKE 1 TABLET BY MOUTH EVERY 6 HOURS AS NEEDED 30 tablet 2   traMADol (ULTRAM) 50 MG tablet Take 50 mg by mouth daily as needed for moderate pain.     No current facility-administered medications for this visit.     SURGICAL HISTORY:  Past Surgical History:  Procedure Laterality Date   BRONCHIAL BIOPSY  01/15/2022   Procedure: BRONCHIAL BIOPSIES;  Surgeon: Omar Person, MD;  Location: [facility] ENDOSCOPY;  Service: Pulmonary;;   BRONCHIAL BRUSHINGS  01/15/2022   Procedure: BRONCHIAL BRUSHINGS;  Surgeon: Omar Person, MD;  Location: [facility] ENDOSCOPY;  Service: Pulmonary;;   BRONCHIAL NEEDLE ASPIRATION BIOPSY  01/15/2022   Procedure: BRONCHIAL NEEDLE ASPIRATION BIOPSIES;  Surgeon: Omar Person, MD;  Location: [facility] ENDOSCOPY;  Service: Pulmonary;;   CARDIAC CATHETERIZATION  02/21/2017   CHOLECYSTECTOMY     CORONARY STENT  INTERVENTION N/A 02/19/2017   Procedure: CORONARY STENT INTERVENTION;  Surgeon: Swaziland, Peter M, MD;  Location: [facility] INVASIVE CV LAB;  Service: Cardiovascular;  Laterality: N/A;   CORONARY STENT INTERVENTION N/A 02/21/2017   Procedure: CORONARY STENT INTERVENTION;  Surgeon: Lyn Records, MD;  Location: [facility] INVASIVE CV LAB;  Service: Cardiovascular;  Laterality: N/A;   FIDUCIAL MARKER PLACEMENT  01/15/2022   Procedure: FIDUCIAL MARKER PLACEMENT;  Surgeon: Omar Person, MD;  Location: [facility] ENDOSCOPY;  Service: Pulmonary;;   FINE NEEDLE ASPIRATION  01/15/2022   Procedure: FINE NEEDLE ASPIRATION;  Surgeon: Omar Person, MD;  Location: [facility] ENDOSCOPY;  Service: Pulmonary;;   HEMOSTASIS CONTROL  01/15/2022   Procedure: HEMOSTASIS CONTROL;  Surgeon: Omar Person, MD;  Location: [facility] ENDOSCOPY;  Service: Pulmonary;;   KNEE ARTHROSCOPY Right 2010   KNEE ARTHROSCOPY WITH SUBCHONDROPLASTY Left 01/09/2020   Procedure: Left knee arthroscopy,partial medial meniscectomy, debridement, subchondroplasty left proximal tibia;  Surgeon: Jene Every, MD;  Location: [name];  Service: Orthopedics;  Laterality: Left;   LEFT HEART CATH AND CORONARY ANGIOGRAPHY N/A 02/19/2017   Procedure: LEFT HEART CATH AND CORONARY ANGIOGRAPHY;  Surgeon: Swaziland, Peter M, MD;  Location: [facility] INVASIVE CV LAB;   Service: Cardiovascular;  Laterality: N/A;   TONSILLECTOMY     TUBAL LIGATION     VIDEO BRONCHOSCOPY WITH ENDOBRONCHIAL ULTRASOUND N/A 01/15/2022   Procedure: VIDEO BRONCHOSCOPY WITH ENDOBRONCHIAL ULTRASOUND;  Surgeon: Omar Person, MD;  Location: [facility] ENDOSCOPY;  Service: Pulmonary;  Laterality: N/A;   VIDEO BRONCHOSCOPY WITH RADIAL ENDOBRONCHIAL ULTRASOUND  01/15/2022   Procedure: RADIAL ENDOBRONCHIAL ULTRASOUND;  Surgeon: Omar Person, MD;  Location: [facility] ENDOSCOPY;  Service: Pulmonary;;    REVIEW OF SYSTEMS:   Review of Systems  Constitutional: Positive for weight loss, fatigue, and diminished appetite. Negative for chills and fever.  HENT: Negative for mouth sores, nosebleeds, sore throat and trouble swallowing.   Eyes: Positive for watery eyes. Negative for eye problems and icterus.  Respiratory: Negative for cough, hemoptysis, shortness of breath and wheezing.   Cardiovascular: Negative for chest pain and leg swelling.  Gastrointestinal: Negative for abdominal pain, constipation, diarrhea, nausea and vomiting.  Genitourinary: Negative for bladder incontinence, difficulty urinating, dysuria, frequency and hematuria.   Musculoskeletal: Negative for back pain, gait problem, neck pain and neck stiffness.  Skin: Negative for itching and rash.  Neurological: Negative for dizziness, extremity weakness, gait problem, headaches, light-headedness and seizures.  Hematological: Negative for adenopathy. Does not bruise/bleed easily.  Psychiatric/Behavioral: Negative for confusion, depression and sleep disturbance. The patient is not nervous/anxious.     PHYSICAL EXAMINATION:  There were no vitals taken for this visit.  ECOG PERFORMANCE STATUS: 2  Physical Exam  Constitutional: Oriented to person, place, and time and chronically ill appearing female and in no distress.   HENT:  Head: Normocephalic and atraumatic.  Mouth/Throat: Oropharynx is clear and moist. No oropharyngeal exudate.   Eyes: Conjunctivae are normal. Right eye exhibits no discharge. Left eye exhibits no discharge. No scleral icterus.   Neck: Normal range of motion. Neck supple.  Cardiovascular: Normal rate, regular rhythm, normal heart sounds and intact distal pulses.   Pulmonary/Chest: Effort normal and breath sounds normal. No respiratory distress. No wheezes. No rales.  Abdominal: Soft. Bowel sounds are normal. Exhibits no distension and no mass. There is no tenderness.  Musculoskeletal: Normal range of motion. Exhibits no edema.  Lymphadenopathy:    No cervical adenopathy.  Neurological: Alert and oriented to person, place, and time. Exhibits muscle  wasting. Examined in the wheelchair.  Skin: Skin is warm and dry. No rash noted. Not diaphoretic. No erythema. No pallor.  Psychiatric: Mood, memory and judgment normal.  Vitals reviewed.  LABORATORY DATA: Lab Results  Component Value Date   WBC 7.8 09/28/2022   HGB 11.9 (L) 09/28/2022   HCT 33.6 (L) 09/28/2022   MCV 99.4 09/28/2022   PLT 292 09/28/2022      Chemistry      Component Value Date/Time   NA 132 (L) 09/28/2022 1045   NA 138 07/19/2022 1620   K 3.6 09/28/2022 1045   CL 99 09/28/2022 1045   CO2 24 09/28/2022 1045   BUN 22 09/28/2022 1045   BUN 8 07/19/2022 1620   CREATININE 0.79 09/28/2022 1045      Component Value Date/Time   CALCIUM 8.8 (L) 09/28/2022 1045   ALKPHOS 72 09/28/2022 1045   AST 30 09/28/2022 1045   ALT 31 09/28/2022 1045   BILITOT 0.8 09/28/2022 1045       RADIOGRAPHIC STUDIES:  CT CHEST ABDOMEN PELVIS W CONTRAST  Result Date: 09/25/2022 CLINICAL DATA:  Non-small cell lung cancer. Adenocarcinoma. Restaging. * Tracking Code: BO *. EXAM: CT CHEST, ABDOMEN, AND PELVIS WITH CONTRAST TECHNIQUE: Multidetector CT imaging of the chest, abdomen and pelvis was performed following the standard protocol during bolus administration of intravenous contrast. RADIATION DOSE REDUCTION: This exam was performed according to  the departmental dose-optimization program which includes automated exposure control, adjustment of the mA and/or kV according to patient size and/or use of iterative reconstruction technique. CONTRAST:  OMNIPAQUE IOHEXOL 300 MG/ML  SOLN COMPARISON:  07/22/2022 FINDINGS: CT CHEST FINDINGS Cardiovascular: Heart size appears within normal limits. Aortic atherosclerosis. Coronary artery calcifications. No pericardial effusion. Mediastinum/Nodes: Thyroid gland, trachea and esophagus are unremarkable. Index left hilar lymph node has progressed from the previous exam now measuring 2.5 x 2.1 cm, image 31/2. Previously 1.3 x 0.9 cm. Calcified mediastinal lymph nodes. Lungs/Pleura: No pleural effusion. Nodule within the posterior left upper lobe abutting the oblique fissure measures 2.3 x 1.1 cm, image 64/4. On the previous exam this measured 2.5 x 1.3 cm. Additional tiny lung nodules are unchanged including 3 mm lingular nodule, image 82/4. No new lung nodules identified. Musculoskeletal: No chest wall mass or suspicious bone lesions identified. CT ABDOMEN PELVIS FINDINGS Hepatobiliary: No focal liver abnormality is seen. Status post cholecystectomy. No biliary dilatation. Pancreas: Unremarkable. No pancreatic ductal dilatation or surrounding inflammatory changes. Spleen: Calcified granulomas noted.  Spleen otherwise unremarkable. Adrenals/Urinary Tract: Bilateral adrenal gland adenomas are again seen and appear unchanged from previous exam. The largest is in the left adrenal gland measuring 3.6 by 2.2 cm, image 61/2. No follow-up imaging recommended. Nonobstructing stone within the left kidney measures 1.2 cm. Bilateral fluid attenuating kidney lesions are identified compatible with multiple cysts. The largest is in the upper pole of left kidney measuring 6.5 cm. No hydronephrosis identified bilaterally. Bladder appears normal. Stomach/Bowel: Stomach is within normal limits. Appendix appears normal. No evidence of  bowel wall thickening, distention, or inflammatory changes. Vascular/Lymphatic: Aortic atherosclerosis. No enlarged abdominal or pelvic lymph nodes. Reproductive: Uterus and bilateral adnexa are unremarkable. Other: No ascites or focal fluid collections. Musculoskeletal: No acute or significant osseous findings. IMPRESSION: 1. Interval increase in size of left hilar lymph node compatible with progression of disease. 2. The posterior left upper lobe lung nodule is not significantly changed in size in the interval. 3. No signs of metastatic disease to the abdomen or pelvis. 4. Nonobstructing  left renal calculus. 5.  Aortic Atherosclerosis (ICD10-I70.0). Electronically Signed   By: Signa Kell M.D.   On: 09/25/2022 10:26     ASSESSMENT/PLAN:  This is a very pleasant 72 year old Caucasian female diagnosed with stage IV (T1c, N2, M1 C) non-small cell lung cancer, adenocarcinoma.  The patient presented with a left upper lobe lung mass in addition to left hilar mediastinal lymphadenopathy as well as innumerable brain metastases.  She was diagnosed in September 2023.    She is positive for K-ras G12 C mutation which can be targeted in the second line setting. Her PDL1 expression is 93%.   She underwent SRS to the multiple brain lesions on 02/24/2022.   She is currently undergoing palliative systemic chemotherapy with carboplatin for AUC of 5, Alimta 500 mg per metered square, Keytruda 200 mg IV every 3 weeks.  She is status post 11 cycles and tolerated it well except for controlled nausea/vomiting. Starting from cycle #5 she started maintenance alimta and [name].    She will proceed with cycle #12 today as scheduled.    Will see the patient back for follow-up visit in 3 weeks for evaluation repeat blood work before undergoing cycle #13  She is expected to start radiation to the left hilar nodule.  Simulation is scheduled for 10/21/2022.  Her radiation is scheduled to start the second week of July 2024.   I asked Dr. Arbutus Ped this morning if she should continue on Keytruda with her upcoming radiation.  Dr. Arbutus Ped discussed that it is likely okay to continue with Keytruda and Alimta at this time.  I discussed the importance of having a PCP.  I will refer the patient to the internal medicine residency clinic.  Discussed that her treatment with chemotherapy can cause some tearing of the eyes.  I do not see any evidence of bacterial eye infection at this time.  She will continue to use refresh tears.  Of course we did discuss signs of eye infection such as purulent discharge throughout the course of the day, swelling, vision changes, erythema, etc.  Advised to use good hygiene with warm washcloth in the morning and at nighttime.  I offered the patient options for appetite stimulant such as Megace and Remeron.  Discussed the small risk of blood clots with the Megace.  The patient would like to think about her options at this time and will let us know if she is interested in a prescription.  She will continue on her Decadron taper.  She is currently taking 1 mg.  Encouraged the patient to eat small frequent meals and hydrate well.  She previously saw a member the nutritionist team.  She is not interested in a referral at this time for re-evaluation.    The patient was advised to call immediately if she has any concerning symptoms in the interval. The patient voices understanding of current disease status and treatment options and is in agreement with the current care plan. All questions were answered. The patient knows to call the clinic with any problems, questions or concerns. We can certainly see the patient much sooner if necessary    No orders of the defined types were placed in this encounter.    The total time spent in the appointment was 20-29 minutes.   Dayven Linsley L Damya Comley, PA-C 10/14/22

## 2022-10-18 ENCOUNTER — Other Ambulatory Visit: Payer: Self-pay

## 2022-10-18 ENCOUNTER — Inpatient Hospital Stay: Payer: Medicare Other

## 2022-10-18 ENCOUNTER — Inpatient Hospital Stay: Payer: Medicare Other | Admitting: Physician Assistant

## 2022-10-18 ENCOUNTER — Inpatient Hospital Stay: Payer: Medicare Other | Attending: Radiation Oncology

## 2022-10-18 VITALS — BP 100/70 | HR 105 | Temp 98.0°F | Resp 18 | Wt 158.4 lb

## 2022-10-18 DIAGNOSIS — C7931 Secondary malignant neoplasm of brain: Secondary | ICD-10-CM | POA: Insufficient documentation

## 2022-10-18 DIAGNOSIS — Z5112 Encounter for antineoplastic immunotherapy: Secondary | ICD-10-CM | POA: Insufficient documentation

## 2022-10-18 DIAGNOSIS — C3492 Malignant neoplasm of unspecified part of left bronchus or lung: Secondary | ICD-10-CM

## 2022-10-18 DIAGNOSIS — D709 Neutropenia, unspecified: Secondary | ICD-10-CM | POA: Diagnosis not present

## 2022-10-18 DIAGNOSIS — R42 Dizziness and giddiness: Secondary | ICD-10-CM | POA: Diagnosis not present

## 2022-10-18 DIAGNOSIS — C3412 Malignant neoplasm of upper lobe, left bronchus or lung: Secondary | ICD-10-CM | POA: Diagnosis present

## 2022-10-18 DIAGNOSIS — Z5111 Encounter for antineoplastic chemotherapy: Secondary | ICD-10-CM | POA: Diagnosis present

## 2022-10-18 DIAGNOSIS — Z5189 Encounter for other specified aftercare: Secondary | ICD-10-CM | POA: Diagnosis not present

## 2022-10-18 DIAGNOSIS — Z79899 Other long term (current) drug therapy: Secondary | ICD-10-CM | POA: Diagnosis not present

## 2022-10-18 LAB — CBC WITH DIFFERENTIAL (CANCER CENTER ONLY)
Abs Immature Granulocytes: 0.07 10*3/uL (ref 0.00–0.07)
Basophils Absolute: 0 10*3/uL (ref 0.0–0.1)
Basophils Relative: 0 %
Eosinophils Absolute: 0 10*3/uL (ref 0.0–0.5)
Eosinophils Relative: 0 %
HCT: 30.1 % — ABNORMAL LOW (ref 36.0–46.0)
Hemoglobin: 10.7 g/dL — ABNORMAL LOW (ref 12.0–15.0)
Immature Granulocytes: 1 %
Lymphocytes Relative: 14 %
Lymphs Abs: 0.8 10*3/uL (ref 0.7–4.0)
MCH: 34.5 pg — ABNORMAL HIGH (ref 26.0–34.0)
MCHC: 35.5 g/dL (ref 30.0–36.0)
MCV: 97.1 fL (ref 80.0–100.0)
Monocytes Absolute: 0.3 10*3/uL (ref 0.1–1.0)
Monocytes Relative: 5 %
Neutro Abs: 4.8 10*3/uL (ref 1.7–7.7)
Neutrophils Relative %: 80 %
Platelet Count: 284 10*3/uL (ref 150–400)
RBC: 3.1 MIL/uL — ABNORMAL LOW (ref 3.87–5.11)
RDW: 14.8 % (ref 11.5–15.5)
WBC Count: 6 10*3/uL (ref 4.0–10.5)
nRBC: 0.3 % — ABNORMAL HIGH (ref 0.0–0.2)

## 2022-10-18 LAB — CMP (CANCER CENTER ONLY)
ALT: 38 U/L (ref 0–44)
AST: 46 U/L — ABNORMAL HIGH (ref 15–41)
Albumin: 3.1 g/dL — ABNORMAL LOW (ref 3.5–5.0)
Alkaline Phosphatase: 81 U/L (ref 38–126)
Anion gap: 9 (ref 5–15)
BUN: 21 mg/dL (ref 8–23)
CO2: 24 mmol/L (ref 22–32)
Calcium: 8.8 mg/dL — ABNORMAL LOW (ref 8.9–10.3)
Chloride: 97 mmol/L — ABNORMAL LOW (ref 98–111)
Creatinine: 0.75 mg/dL (ref 0.44–1.00)
GFR, Estimated: >60 mL/min (ref >60)
Glucose, Bld: 192 mg/dL — ABNORMAL HIGH (ref 70–99)
Potassium: 3.9 mmol/L (ref 3.5–5.1)
Sodium: 130 mmol/L — ABNORMAL LOW (ref 135–145)
Total Bilirubin: 1.2 mg/dL (ref 0.3–1.2)
Total Protein: 6.3 g/dL — ABNORMAL LOW (ref 6.5–8.1)

## 2022-10-18 LAB — TSH: TSH: 0.99 u[IU]/mL (ref 0.350–4.500)

## 2022-10-18 MED ORDER — PROCHLORPERAZINE MALEATE 10 MG PO TABS
10.0000 mg | ORAL_TABLET | Freq: Once | ORAL | Status: AC
  Administered 2022-10-18: 10 mg via ORAL
  Filled 2022-10-18: qty 1

## 2022-10-18 MED ORDER — SODIUM CHLORIDE 0.9 % IV SOLN
200.0000 mg | Freq: Once | INTRAVENOUS | Status: AC
  Administered 2022-10-18: 200 mg via INTRAVENOUS
  Filled 2022-10-18: qty 200

## 2022-10-18 MED ORDER — SODIUM CHLORIDE 0.9 % IV SOLN: Freq: Once | INTRAVENOUS | Status: AC

## 2022-10-18 MED ORDER — CYANOCOBALAMIN 1000 MCG/ML IJ SOLN
1000.0000 ug | Freq: Once | INTRAMUSCULAR | Status: AC
  Administered 2022-10-18: 1000 ug via INTRAMUSCULAR
  Filled 2022-10-18: qty 1

## 2022-10-18 MED ORDER — SODIUM CHLORIDE 0.9 % IV SOLN
500.0000 mg/m2 | Freq: Once | INTRAVENOUS | Status: AC
  Administered 2022-10-18: 1000 mg via INTRAVENOUS
  Filled 2022-10-18: qty 40

## 2022-10-18 NOTE — Progress Notes (Signed)
Per cassie PA. OK to procede with treatment today with elevated HR

## 2022-10-18 NOTE — Patient Instructions (Signed)
[facility]  Discharge Instructions: Thank you for choosing [facility] to provide your oncology and hematology care.   If you have a lab appointment with the Cancer Center, please go directly to the Cancer Center and check in at the registration area.   Wear comfortable clothing and clothing appropriate for easy access to any Portacath or PICC line.   We strive to give you quality time with your provider. You may need to reschedule your appointment if you arrive late (15 or more minutes).  Arriving late affects you and other patients whose appointments are after yours.  Also, if you miss three or more appointments without notifying the office, you may be dismissed from the clinic at the provider's discretion.      For prescription refill requests, have your pharmacy contact our office and allow 72 hours for refills to be completed.    Today you received the following chemotherapy and/or immunotherapy agents: Keytruda/Alimta      To help prevent nausea and vomiting after your treatment, we encourage you to take your nausea medication as directed.  BELOW ARE SYMPTOMS THAT SHOULD BE REPORTED IMMEDIATELY: *FEVER GREATER THAN 100.4 F (38 C) OR HIGHER *CHILLS OR SWEATING *NAUSEA AND VOMITING THAT IS NOT CONTROLLED WITH YOUR NAUSEA MEDICATION *UNUSUAL SHORTNESS OF BREATH *UNUSUAL BRUISING OR BLEEDING *URINARY PROBLEMS (pain or burning when urinating, or frequent urination) *BOWEL PROBLEMS (unusual diarrhea, constipation, pain near the anus) TENDERNESS IN MOUTH AND THROAT WITH OR WITHOUT PRESENCE OF ULCERS (sore throat, sores in mouth, or a toothache) UNUSUAL RASH, SWELLING OR PAIN  UNUSUAL VAGINAL DISCHARGE OR ITCHING   Items with * indicate a potential emergency and should be followed up as soon as possible or go to the Emergency Department if any problems should occur.  Please show the CHEMOTHERAPY ALERT CARD or IMMUNOTHERAPY ALERT CARD at  check-in to the Emergency Department and triage nurse.  Should you have questions after your visit or need to cancel or reschedule your appointment, please contact [facility]  Dept: [phone]  and follow the prompts.  Office hours are 8:00 a.m. to 4:30 p.m. Monday - Friday. Please note that voicemails left after 4:00 p.m. may not be returned until the following business day.  We are closed weekends and major holidays. You have access to a nurse at all times for urgent questions. Please call the main number to the clinic Dept: [phone] and follow the prompts.   For any non-urgent questions, you may also contact your provider using MyChart. We now offer e-Visits for anyone 18 and older to request care online for non-urgent symptoms. For details visit [email or web address].   Also download the MyChart app! Go to the app store, search "MyChart", open the app, select , and log in with your MyChart username and password.   

## 2022-10-18 NOTE — Progress Notes (Signed)
Dose of Alimta 1000 mg ordered. It is over 10% dose based on today's weight. Per Dr. Arbutus Ped ok to proceed with 1000 mg dose of Alimta today

## 2022-10-19 ENCOUNTER — Ambulatory Visit: Payer: Medicare Other | Admitting: Adult Health

## 2022-10-24 ENCOUNTER — Encounter: Payer: Self-pay | Admitting: Internal Medicine

## 2022-10-25 ENCOUNTER — Telehealth: Payer: Self-pay

## 2022-10-25 ENCOUNTER — Ambulatory Visit: Payer: Medicare Other | Admitting: Radiation Oncology

## 2022-10-25 ENCOUNTER — Telehealth: Payer: Self-pay | Admitting: Cardiology

## 2022-10-25 ENCOUNTER — Telehealth: Payer: Self-pay | Admitting: *Deleted

## 2022-10-25 ENCOUNTER — Other Ambulatory Visit: Payer: Self-pay | Admitting: *Deleted

## 2022-10-25 DIAGNOSIS — C3492 Malignant neoplasm of unspecified part of left bronchus or lung: Secondary | ICD-10-CM

## 2022-10-25 NOTE — Telephone Encounter (Signed)
Pt and pt daughter called by RN at request of family. Pt stated she was overall feeling weak, tired, and dizzy at times. Pt family reports her Bp has been low as well. Today it was 94/80 and she continues to take her bp meds. Rn advised pt not to take her bp meds due to this low bp and contact the cardiology office for advice on these meds. Next the pt and family mentioned she has not been eating and drinking well overall. Rn did ask if she would be willing to come in for IV fluids and she stated she would. Rn reaching out to med onc for assistance to get her into [facility] tomorrow. Finally pt and family confirmed they no long wish to get radiation treatment due to the pt overall condition (being weak, tired, etc). Pt did mention she would think about palliative and hospice care. RN notified Dr. Basilio Cairo about this change in plan. Ct sim was cancelled for today. Rn will follow up on [facility] and also plan for hospice/palliative care.

## 2022-10-25 NOTE — Telephone Encounter (Signed)
Contacted by Alessandra Bevels, RN in [facility] - patient not coming for [facility] today. Victorino Dike contacted patient/daughter by phone and reviewed low b/p and b/p meds with patient/daughter - call documentation in chart.    Due to patient feeling weak and dizzy, per Ms. Heilingoetter, PA request, scheduled lab/[facility] appts for patient in AM @ 0900/0930 for patient evaluation.   Contacted daughter and Left VM with appt times for [facility].    Contacted daughter to confirm times for appts tomorrow. Daughter states mother told her she cannot come to [facility] appt in the morning because she feels too bad. She asked to have appt r/s for later in week and later in day. Asked daughter if they had been able to contact Dr. Swaziland as recommended by Alessandra Bevels, RN to discuss patient's b/p and meds. Daughter states she left a message with his office and is expecting a call back.  Asked daughter if mother is eating. Daughter stated just a little. Advised daughter to encourage mother to drink 8 oz fluid every hour she is awake.  Contacted [facility] RN with information that appt for tomorrow will need to be r/s. [facility] RN stated she will f/u with patient/daughter to r/s patient's appts.

## 2022-10-25 NOTE — Telephone Encounter (Signed)
RN attempted to call pt daughter back concerning questions she had about CT SIM today for her mother. Rn got voicemail and left message for pt daughter to call back.

## 2022-10-25 NOTE — Progress Notes (Signed)
Symptom Management Consult Note [facility]    Patient Care Team: Patient, No Pcp Per as PCP - General (General Practice) [place], Peter M, MD as PCP - Cardiology (Cardiology)    Name / MRN / DOB: Kimberly Huang  [id number]  08-07-1950   Date of visit: 10/26/2022   Chief Complaint/Reason for visit: dizziness   Current Therapy: Alimta and keytruda every 3 weeks  Last treatment:  Day 1   Cycle 12 on 10/18/22   ASSESSMENT & PLAN: Patient is a 72 y.o. female  with oncologic history of stage IV (T1c, N2, M1 C) non-small cell lung cancer, adenocarcinoma followed by Dr. Arbutus Ped.  I have viewed most recent oncology note and lab work.    #Stage IV (T1c, N2, M1 C) non-small cell lung cancer, adenocarcinoma  - Next appointment with oncologist is 11/08/22   #Neutropenia -WBC 1.2 and ANC 0.3. -Patient afebrile and denying infectious symptoms. -Discussed labs with oncologist who recommends G-CSF injections. Grannix administered today and for the next 2 days. -Discussed neutropenic precautions.   #Dizziness -BP low on clinic arrival. Patient took metoprolol and half irbesartan this morning as daughter was unaware of cardiology recommendations to hold irbesartan and metoprolol with hypotension. Cardiology recommendation seen during chart review. -1L NS administered in clinic. On recheck BP improved and patient denying dizziness. -Patient with normal neuro exam. Dizziness likely related to orthostatic hypotension and continued use of hypertension medications with ongoing functional decline. Patient and daughter will closely monitor BP and symptoms at home and RTC if dizziness persists. Also encouraged to increase fluid intake. Patient has history of brain metastasis, last MRI brain on 09/14/22 showing increase in size of known multiple brain mets. If symptoms do not improve when normotensive would consider obtaining MRI. Patient and daughter agreeable with this plan.  Strict  ED precautions discussed should symptoms worsen.  Heme/Onc History: Oncology History  Adenocarcinoma of left lung, stage 4 (HCC)  02/08/2022 Initial Diagnosis   Adenocarcinoma of left lung, stage 4 (HCC)   02/08/2022 Cancer Staging   Staging form: Lung, AJCC 8th Edition - Clinical: Stage IVB (cT1c, cN2, cM1c) - Signed by Si Gaul, MD on 02/08/2022   03/01/2022 -  Chemotherapy   Patient is on Treatment Plan : LUNG Carboplatin (5) + Pemetrexed (500) + Pembrolizumab (200) D1 q21d Induction x 4 cycles / Maintenance Pemetrexed (500) + Pembrolizumab (200) D1 q21d         Interval history-: Kimberly Huang is a 72 y.o. female with oncologic history as above presenting to [facility] today with chief complaint of dizziness. She is accompanied by he daughter who provides additional history.  Patient reports symptoms have been going on x 1 week. She feels like the room is spinning when she changes positions. She has not had any falls. Daughter has been monitoring her BP and systolic has been in the 80s multiple times. She reached out ot cardiology office and is waiting on call back with their recommendations. Patient currently taking irbesartan and metoprolol. She took the metoprolol this AM and half of the irbesartan. Patient has decreased activity levels lately. She tries to stay hydrated, drinks 30 oz water daily. She has decreased appetite, tries to have  protein shakes daily for meals. Patient denies any fever, chills, headaches, visual changes, cough, urinary symptoms. No sick contacts. Daughter adds that on treatment days patient does not take BP medications and systolic pressure is usually above 100.      ROS  All other systems are reviewed and are negative for acute change except as noted in the HPI.    Allergies  Allergen Reactions   Codeine Nausea Only     Past Medical History:  Diagnosis Date   Arthritis    knee, hands   CAD S/P PCI OM1 99%-0%; Plan Staged PCI mRCA 90%.  02/19/2017   1. Severe 2 vessel obstructive CAD    - 99% thrombotic occlusion of first OM. This is a bifurcating vessel.     - 90% mid RCA-segmental 2. Good overall LV dysfunction with lateral HK 3. Moderately elevated LVEDP 4. Successful stenting of the first OM with DES. Distal embolization into the distal lateral OM branch.   Plan: DAPT for one year. Will treat with IV Aggrastat for 18 hours. Start high dose statin, beta blocker. IV Ntg for BP control acutely. Plan for stage PCI of RCA on Monday 10/22 if no complication.   Essential hypertension 02/21/2017   Hyperlipidemia with target LDL less than 70 02/21/2017   Lung cancer (HCC)    Presence of drug-eluting stent in L Cx: OM1 (Xience SIERRA DES 3.5 x 15) 02/21/2017   STEMI involving left circumflex coronary artery (HCC) 02/19/2017   Tobacco abuse 02/19/2017     Past Surgical History:  Procedure Laterality Date   BRONCHIAL BIOPSY  01/15/2022   Procedure: BRONCHIAL BIOPSIES;  Surgeon: Omar Person, MD;  Location: [facility] ENDOSCOPY;  Service: Pulmonary;;   BRONCHIAL BRUSHINGS  01/15/2022   Procedure: BRONCHIAL BRUSHINGS;  Surgeon: Omar Person, MD;  Location: [facility] ENDOSCOPY;  Service: Pulmonary;;   BRONCHIAL NEEDLE ASPIRATION BIOPSY  01/15/2022   Procedure: BRONCHIAL NEEDLE ASPIRATION BIOPSIES;  Surgeon: Omar Person, MD;  Location: [facility] ENDOSCOPY;  Service: Pulmonary;;   CARDIAC CATHETERIZATION  02/21/2017   CHOLECYSTECTOMY     CORONARY STENT INTERVENTION N/A 02/19/2017   Procedure: CORONARY STENT INTERVENTION;  Surgeon: [place]il[place], Peter M, MD;  Location: [facility] INVASIVE CV LAB;  Service: Cardiovascular;  Laterality: N/A;   CORONARY STENT INTERVENTION N/A 02/21/2017   Procedure: CORONARY STENT INTERVENTION;  Surgeon: Lyn Records, MD;  Location: [facility] INVASIVE CV LAB;  Service: Cardiovascular;  Laterality: N/A;   FIDUCIAL MARKER PLACEMENT  01/15/2022   Procedure: FIDUCIAL MARKER PLACEMENT;  Surgeon: Omar Person, MD;  Location: [facility]  ENDOSCOPY;  Service: Pulmonary;;   FINE NEEDLE ASPIRATION  01/15/2022   Procedure: FINE NEEDLE ASPIRATION;  Surgeon: Omar Person, MD;  Location: [facility] ENDOSCOPY;  Service: Pulmonary;;   HEMOSTASIS CONTROL  01/15/2022   Procedure: HEMOSTASIS CONTROL;  Surgeon: Omar Person, MD;  Location: [facility] ENDOSCOPY;  Service: Pulmonary;;   KNEE ARTHROSCOPY Right 2010   KNEE ARTHROSCOPY WITH SUBCHONDROPLASTY Left 01/09/2020   Procedure: Left knee arthroscopy,partial medial meniscectomy, debridement, subchondroplasty left proximal tibia;  Surgeon: Jene Every, MD;  Location: [name];  Service: Orthopedics;  Laterality: Left;   LEFT HEART CATH AND CORONARY ANGIOGRAPHY N/A 02/19/2017   Procedure: LEFT HEART CATH AND CORONARY ANGIOGRAPHY;  Surgeon: [place]azil[place], Peter M, MD;  Location: [facility] INVASIVE CV LAB;  Service: Cardiovascular;  Laterality: N/A;   TONSILLECTOMY     TUBAL LIGATION     VIDEO BRONCHOSCOPY WITH ENDOBRONCHIAL ULTRASOUND N/A 01/15/2022   Procedure: VIDEO BRONCHOSCOPY WITH ENDOBRONCHIAL ULTRASOUND;  Surgeon: Omar Person, MD;  Location: [facility] ENDOSCOPY;  Service: Pulmonary;  Laterality: N/A;   VIDEO BRONCHOSCOPY WITH RADIAL ENDOBRONCHIAL ULTRASOUND  01/15/2022   Procedure: RADIAL ENDOBRONCHIAL ULTRASOUND;  Surgeon: Omar Person, MD;  Location: [facility] ENDOSCOPY;  Service: Pulmonary;;    Social History   Socioeconomic History   Marital status: Married    Spouse name: Not on file   Number of children: 3   Years of education: Not on file   Highest education level: Not on file  Occupational History   Not on file  Tobacco Use   Smoking status: Some Days    Packs/day: 1.50    Years: 50.00    Additional pack years: 0.00    Total pack years: 75.00    Types: Cigarettes   Smokeless tobacco: Never   Tobacco comments:    Trying to quit, currently smokes 5-6 cigarettes per day 01/21/2022  Vaping Use   Vaping Use: Never used  Substance and Sexual Activity   Alcohol use: Yes    Comment:  occasional   Drug use: Never   Sexual activity: Not Currently    Birth control/protection: Surgical    Comment: Hysterectomy//Tubal Ligation  Other Topics Concern   Not on file  Social History Narrative   Family runs the [employer].   Social Determinants of Health   Financial Resource Strain: Not on file  Food Insecurity: No Food Insecurity (10/05/2022)   Hunger Vital Sign    Worried About Running Out of Food in the Last Year: Never true    Ran Out of Food in the Last Year: Never true  Transportation Needs: No Transportation Needs (10/05/2022)   PRAPARE - Administrator, Civil Service (Medical): No    Lack of Transportation (Non-Medical): No  Physical Activity: Not on file  Stress: Not on file  Social Connections: Not on file  Intimate Partner Violence: Not At Risk (10/05/2022)   Humiliation, Afraid, Rape, and Kick questionnaire    Fear of Current or Ex-Partner: No    Emotionally Abused: No    Physically Abused: No    Sexually Abused: No    Family History  Problem Relation Age of Onset   Diabetes Mother    Lung cancer Father    Heart attack Sister      Current Outpatient Medications:    acetaminophen (TYLENOL) 500 MG tablet, Take 500 mg by mouth as needed for moderate pain., Disp: , Rfl:    aspirin 81 MG chewable tablet, Chew 1 tablet (81 mg total) by mouth daily., Disp: , Rfl:    atorvastatin (LIPITOR) 80 MG tablet, TAKE 1 TABLET BY MOUTH DAILY AT 6 PM., Disp: 90 tablet, Rfl: 3   Coenzyme Q10 (CO Q 10) 100 MG CAPS, Take 100 mg by mouth every evening., Disp: , Rfl:    diclofenac Sodium (VOLTAREN) 1 % GEL, Apply 1 Application topically daily as needed (Knee pain)., Disp: , Rfl:    ezetimibe (ZETIA) 10 MG tablet, TAKE 1 TABLET BY MOUTH EVERY DAY, Disp: 90 tablet, Rfl: 0   folic acid (FOLVITE) 1 MG tablet, Take 1 tablet (1 mg total) by mouth daily., Disp: 90 tablet, Rfl: 1   furosemide (LASIX) 20 MG tablet, Once daily only as needed for swelling of the lower  extremities, Disp: 30 tablet, Rfl: 6   irbesartan (AVAPRO) 150 MG tablet, Take 1 tablet (150 mg total) by mouth daily., Disp: 90 tablet, Rfl: 3   levETIRAcetam (KEPPRA) 500 MG tablet, TAKE 1 TABLET BY MOUTH TWICE A DAY, Disp: 180 tablet, Rfl: 1   metoprolol tartrate (LOPRESSOR) 25 MG tablet, TAKE 1 TABLET BY MOUTH TWICE A DAY, Disp: 90 tablet, Rfl: 3   nitroGLYCERIN (NITROSTAT) 0.4 MG SL tablet, Place  1 tablet (0.4 mg total) under the tongue every 5 (five) minutes x 3 doses as needed for chest pain., Disp: 25 tablet, Rfl: 2   ondansetron (ZOFRAN) 8 MG tablet, Take 1 tablet (8 mg total) by mouth every 8 (eight) hours as needed for nausea or vomiting. Starting day 3 after chemotherapy (Patient not taking: Reported on 10/18/2022), Disp: 30 tablet, Rfl: 2   pantoprazole (PROTONIX) 40 MG tablet, TAKE 1 TABLET BY MOUTH EVERY DAY, Disp: 90 tablet, Rfl: 1   prochlorperazine (COMPAZINE) 10 MG tablet, TAKE 1 TABLET BY MOUTH EVERY 6 HOURS AS NEEDED, Disp: 30 tablet, Rfl: 2   traMADol (ULTRAM) 50 MG tablet, Take 50 mg by mouth daily as needed for moderate pain. (Patient not taking: Reported on 10/18/2022), Disp: , Rfl:   PHYSICAL EXAM: ECOG FS:3 - Symptomatic, >50% confined to bed    Vitals:   10/26/22 1354 10/26/22 1625  BP: (!) 80/58 95/63  Pulse: 95 71  Resp: 16 16  Temp: 98.6 F (37 C) 98.4 F (36.9 C)  TempSrc: Oral Oral  SpO2: 95% 95%  Weight: 156 lb 3.2 oz (70.9 kg)   Height: 5\' 2"  (1.575 m)    Physical Exam Vitals and nursing note reviewed.  Constitutional:      Appearance: She is not ill-appearing or toxic-appearing.  HENT:     Head: Normocephalic.     Mouth/Throat:     Mouth: Mucous membranes are dry.  Eyes:     Conjunctiva/sclera: Conjunctivae normal.  Cardiovascular:     Rate and Rhythm: Normal rate and regular rhythm.     Pulses: Normal pulses.     Heart sounds: Normal heart sounds.  Pulmonary:     Effort: Pulmonary effort is normal.     Breath sounds: Normal breath sounds.   Abdominal:     General: There is no distension.  Musculoskeletal:     Cervical back: Normal range of motion.     Right lower leg: No edema.     Left lower leg: No edema.  Skin:    General: Skin is warm and dry.  Neurological:     Mental Status: She is alert.     Comments: Speech is clear and goal oriented, follows commands CN III-XII intact, no facial droop Normal strength in upper and lower extremities bilaterally including dorsiflexion and plantar flexion, strong and equal grip strength Sensation normal to light and sharp touch Moves extremities without ataxia, coordination intact          LABORATORY DATA: I have reviewed the data as listed    Latest Ref Rng & Units 10/26/2022    1:20 PM 10/18/2022   10:36 AM 09/28/2022   10:45 AM  CBC  WBC 4.0 - 10.5 K/uL 1.2  6.0  7.8   Hemoglobin 12.0 - 15.0 g/dL 9.2  01.0  27.2   Hematocrit 36.0 - 46.0 % 25.6  30.1  33.6   Platelets 150 - 400 K/uL 174  284  292         Latest Ref Rng & Units 10/26/2022    1:20 PM 10/18/2022   10:36 AM 09/28/2022   10:45 AM  CMP  Glucose 70 - 99 mg/dL 536  644  034   BUN 8 - 23 mg/dL 21  21  22    Creatinine 0.44 - 1.00 mg/dL 7.42  5.95  6.38   Sodium 135 - 145 mmol/L 131  130  132   Potassium 3.5 - 5.1 mmol/L 3.8  3.9  3.6   Chloride 98 - 111 mmol/L 98  97  99   CO2 22 - 32 mmol/L 25  24  24    Calcium 8.9 - 10.3 mg/dL 8.6  8.8  8.8   Total Protein 6.5 - 8.1 g/dL 6.9  6.3  6.4   Total Bilirubin 0.3 - 1.2 mg/dL 0.9  1.2  0.8   Alkaline Phos 38 - 126 U/L 90  81  72   AST 15 - 41 U/L 85  46  30   ALT 0 - 44 U/L 92  38  31        RADIOGRAPHIC STUDIES (from last 24 hours if applicable) I have personally reviewed the radiological images as listed and agreed with the findings in the report. No results found.      Visit Diagnosis: 1. Adenocarcinoma of left lung, stage 4 (HCC)   2. Hypotension, unspecified hypotension type   3. Dizziness      No orders of the defined types were placed  in this encounter.   All questions were answered. The patient knows to call the clinic with any problems, questions or concerns. No barriers to learning was detected.  A total of more than 30 minutes were spent on this encounter with face-to-face time and non-face-to-face time, including preparing to see the patient, ordering tests and/or medications, counseling the patient and coordination of care as outlined above.    Thank you for allowing me to participate in the care of this patient.    Shanon Ace, PA-C Department of Hematology/Oncology [facility] at [facility] Phone: [phone]  [phone]    10/26/2022 9:48 PM

## 2022-10-25 NOTE — Telephone Encounter (Signed)
This RN called and spoke with patient's daughter, Greta Doom, regarding [facility] visit tomorrow. Lab appointment made for 1:30 and [facility] at 2:00 for IV fluids. Per Greta Doom, patient has been experiencing increased fatigue and dizziness. She states the patient has not complained of diarrhea/vomiting but could probably benefit from IV fluids due to decreased PO intake.

## 2022-10-25 NOTE — Telephone Encounter (Signed)
Spoke to pt [place] daughter who is not listed to the DPR, pt did give verbal consent to speak with her daughter. Pt daughter stated her mother c/o dizziness , hypotension, and has shortness of breath at baseline however, it has not worsen. Ms. Greta Doom was advised recently by the Oncologist to monitor pt bp daily.  This morning bp 94/58, pt did eat and took her bp medications, rechecked bp 102/73. Pt daughter stated she will like advise to d/c pt hypertension medication due to her current symptoms. Will forward to MD and nurse for advise.   Blood pressure readings while at infusion clinic, pt does not take any hypertension medications prior to her infusions.  5/6   103/65 5/28  107/75 6/17  100/70

## 2022-10-25 NOTE — Telephone Encounter (Signed)
  Pt c/o medication issue:  1. Name of Medication:   atorvastatin (LIPITOR) 80 MG tablet    irbesartan (AVAPRO) 150 MG tablet  metoprolol tartrate (LOPRESSOR) 25 MG tablet  2. How are you currently taking this medication (dosage and times per day)? As written   3. Are you having a reaction (difficulty breathing--STAT)? No   4. What is your medication issue?  Kimberly Huang has called to inform us that the patient is experiencing dizzy spells. According to her oncologist's advice, they would like to consult Dr. [place] regarding the possibility of discontinuing the patient's BP medications, as her BP has been consistently low. The patient is diagnosed stage 4 lung cancer, and her BP readings are 94/58. After eating, her BP increased slightly to 104/78.

## 2022-10-26 ENCOUNTER — Encounter: Payer: Self-pay | Admitting: Physician Assistant

## 2022-10-26 ENCOUNTER — Other Ambulatory Visit: Payer: Self-pay | Admitting: Medical Oncology

## 2022-10-26 ENCOUNTER — Encounter: Payer: Self-pay | Admitting: Internal Medicine

## 2022-10-26 ENCOUNTER — Other Ambulatory Visit: Payer: Self-pay | Admitting: Physician Assistant

## 2022-10-26 ENCOUNTER — Inpatient Hospital Stay (HOSPITAL_BASED_OUTPATIENT_CLINIC_OR_DEPARTMENT_OTHER): Payer: Medicare Other | Admitting: Physician Assistant

## 2022-10-26 ENCOUNTER — Inpatient Hospital Stay: Payer: Medicare Other

## 2022-10-26 ENCOUNTER — Other Ambulatory Visit: Payer: Self-pay

## 2022-10-26 ENCOUNTER — Other Ambulatory Visit: Payer: Self-pay | Admitting: Internal Medicine

## 2022-10-26 VITALS — Ht 62.0 in

## 2022-10-26 VITALS — BP 95/63 | HR 71 | Temp 98.4°F | Resp 16 | Ht 62.0 in | Wt 156.2 lb

## 2022-10-26 DIAGNOSIS — C3492 Malignant neoplasm of unspecified part of left bronchus or lung: Secondary | ICD-10-CM

## 2022-10-26 DIAGNOSIS — T451X5A Adverse effect of antineoplastic and immunosuppressive drugs, initial encounter: Secondary | ICD-10-CM

## 2022-10-26 DIAGNOSIS — C7931 Secondary malignant neoplasm of brain: Secondary | ICD-10-CM

## 2022-10-26 DIAGNOSIS — I959 Hypotension, unspecified: Secondary | ICD-10-CM

## 2022-10-26 DIAGNOSIS — R42 Dizziness and giddiness: Secondary | ICD-10-CM | POA: Diagnosis not present

## 2022-10-26 DIAGNOSIS — Z5111 Encounter for antineoplastic chemotherapy: Secondary | ICD-10-CM | POA: Diagnosis not present

## 2022-10-26 LAB — CBC WITH DIFFERENTIAL (CANCER CENTER ONLY)
Abs Immature Granulocytes: 0.05 10*3/uL (ref 0.00–0.07)
Basophils Absolute: 0 10*3/uL (ref 0.0–0.1)
Basophils Relative: 0 %
Eosinophils Absolute: 0 10*3/uL (ref 0.0–0.5)
Eosinophils Relative: 0 %
HCT: 25.6 % — ABNORMAL LOW (ref 36.0–46.0)
Hemoglobin: 9.2 g/dL — ABNORMAL LOW (ref 12.0–15.0)
Immature Granulocytes: 4 %
Lymphocytes Relative: 62 %
Lymphs Abs: 0.8 10*3/uL (ref 0.7–4.0)
MCH: 34.7 pg — ABNORMAL HIGH (ref 26.0–34.0)
MCHC: 35.9 g/dL (ref 30.0–36.0)
MCV: 96.6 fL (ref 80.0–100.0)
Monocytes Absolute: 0.2 10*3/uL (ref 0.1–1.0)
Monocytes Relative: 13 %
Neutro Abs: 0.3 10*3/uL — CL (ref 1.7–7.7)
Neutrophils Relative %: 21 %
Platelet Count: 174 10*3/uL (ref 150–400)
RBC: 2.65 MIL/uL — ABNORMAL LOW (ref 3.87–5.11)
RDW: 14.6 % (ref 11.5–15.5)
Smear Review: NORMAL
WBC Count: 1.2 10*3/uL — ABNORMAL LOW (ref 4.0–10.5)
nRBC: 0 % (ref 0.0–0.2)

## 2022-10-26 LAB — CMP (CANCER CENTER ONLY)
ALT: 92 U/L — ABNORMAL HIGH (ref 0–44)
AST: 85 U/L — ABNORMAL HIGH (ref 15–41)
Albumin: 3.1 g/dL — ABNORMAL LOW (ref 3.5–5.0)
Alkaline Phosphatase: 90 U/L (ref 38–126)
Anion gap: 8 (ref 5–15)
BUN: 21 mg/dL (ref 8–23)
CO2: 25 mmol/L (ref 22–32)
Calcium: 8.6 mg/dL — ABNORMAL LOW (ref 8.9–10.3)
Chloride: 98 mmol/L (ref 98–111)
Creatinine: 0.8 mg/dL (ref 0.44–1.00)
GFR, Estimated: >60 mL/min (ref >60)
Glucose, Bld: 160 mg/dL — ABNORMAL HIGH (ref 70–99)
Potassium: 3.8 mmol/L (ref 3.5–5.1)
Sodium: 131 mmol/L — ABNORMAL LOW (ref 135–145)
Total Bilirubin: 0.9 mg/dL (ref 0.3–1.2)
Total Protein: 6.9 g/dL (ref 6.5–8.1)

## 2022-10-26 MED ORDER — FILGRASTIM-SNDZ 300 MCG/0.5ML IJ SOSY
300.0000 ug | PREFILLED_SYRINGE | Freq: Once | INTRAMUSCULAR | Status: AC
  Administered 2022-10-26: 300 ug via SUBCUTANEOUS
  Filled 2022-10-26: qty 0.5

## 2022-10-26 MED ORDER — SODIUM CHLORIDE 0.9 % IV SOLN: Freq: Once | INTRAVENOUS | Status: AC

## 2022-10-26 NOTE — Telephone Encounter (Signed)
Left message for patient's daughter to call back. 

## 2022-10-26 NOTE — Telephone Encounter (Signed)
Called patient's daughter left message on personal voice mail to call back. 

## 2022-10-26 NOTE — Telephone Encounter (Signed)
Pt's daughter returning nurses phone call. Please advise.

## 2022-10-26 NOTE — Telephone Encounter (Signed)
LVM that we have called to give medication changes and to please call office to go over the changes.

## 2022-10-27 ENCOUNTER — Inpatient Hospital Stay: Payer: Medicare Other

## 2022-10-27 ENCOUNTER — Other Ambulatory Visit: Payer: Self-pay

## 2022-10-27 ENCOUNTER — Encounter: Payer: Self-pay | Admitting: Cardiology

## 2022-10-27 VITALS — BP 112/71 | HR 123 | Temp 98.7°F | Resp 16

## 2022-10-27 DIAGNOSIS — T451X5A Adverse effect of antineoplastic and immunosuppressive drugs, initial encounter: Secondary | ICD-10-CM

## 2022-10-27 DIAGNOSIS — Z5111 Encounter for antineoplastic chemotherapy: Secondary | ICD-10-CM | POA: Diagnosis not present

## 2022-10-27 MED ORDER — FILGRASTIM-SNDZ 300 MCG/0.5ML IJ SOSY
300.0000 ug | PREFILLED_SYRINGE | Freq: Once | INTRAMUSCULAR | Status: AC
  Administered 2022-10-27: 300 ug via SUBCUTANEOUS
  Filled 2022-10-27: qty 0.5

## 2022-10-27 NOTE — Telephone Encounter (Signed)
Patient's daughter in law called called back stating that oncology already gave them the information and another call back will not be necessary.

## 2022-10-27 NOTE — Progress Notes (Signed)
Upon obtaining patient's vitals, patient's heart rate was noted to range from 120s-130. Patient denied shortness of breath, chest pain or other symptoms related to elevated heart rate. Namon Cirri, PA-C, notified and verbal order for EKG was obtained. EKG showed sinus tachycardia, reviewed by Julaine Fusi and Dr. Arbutus Ped. Per both providers, patient is safe to go home and will follow up with cardiology. ER precautions reviewed should patient become symptomatic. No further questions or concerns at this time.

## 2022-10-28 ENCOUNTER — Inpatient Hospital Stay: Payer: Medicare Other

## 2022-10-28 VITALS — BP 103/70 | HR 120 | Temp 98.7°F | Resp 18

## 2022-10-28 DIAGNOSIS — T451X5A Adverse effect of antineoplastic and immunosuppressive drugs, initial encounter: Secondary | ICD-10-CM

## 2022-10-28 DIAGNOSIS — Z5111 Encounter for antineoplastic chemotherapy: Secondary | ICD-10-CM | POA: Diagnosis not present

## 2022-10-28 MED ORDER — FILGRASTIM-SNDZ 300 MCG/0.5ML IJ SOSY
300.0000 ug | PREFILLED_SYRINGE | Freq: Once | INTRAMUSCULAR | Status: AC
  Administered 2022-10-28: 300 ug via SUBCUTANEOUS
  Filled 2022-10-28: qty 0.5

## 2022-11-01 ENCOUNTER — Encounter: Payer: Self-pay | Admitting: Internal Medicine

## 2022-11-02 ENCOUNTER — Other Ambulatory Visit: Payer: Self-pay

## 2022-11-02 ENCOUNTER — Encounter: Payer: Self-pay | Admitting: Medical Oncology

## 2022-11-02 DIAGNOSIS — R630 Anorexia: Secondary | ICD-10-CM

## 2022-11-02 MED ORDER — MIRTAZAPINE 15 MG PO TABS
15.0000 mg | ORAL_TABLET | Freq: Every day | ORAL | 1 refills | Status: DC
Start: 2022-11-02 — End: 2022-11-25

## 2022-11-02 NOTE — Progress Notes (Signed)
Home health referrals- [facility] declined referral due to inadequate staffing.

## 2022-11-03 ENCOUNTER — Telehealth: Payer: Self-pay | Admitting: Medical Oncology

## 2022-11-03 NOTE — Telephone Encounter (Signed)
Faxed HH referral and progress notes, demographics.

## 2022-11-05 ENCOUNTER — Telehealth: Payer: Self-pay | Admitting: Medical Oncology

## 2022-11-05 NOTE — Telephone Encounter (Signed)
[facility] declined taking pt due to staffing shortage.

## 2022-11-08 ENCOUNTER — Inpatient Hospital Stay: Payer: Medicare Other

## 2022-11-08 ENCOUNTER — Inpatient Hospital Stay (HOSPITAL_BASED_OUTPATIENT_CLINIC_OR_DEPARTMENT_OTHER): Payer: Medicare Other | Admitting: Internal Medicine

## 2022-11-08 ENCOUNTER — Inpatient Hospital Stay: Payer: Medicare Other | Attending: Radiation Oncology

## 2022-11-08 ENCOUNTER — Other Ambulatory Visit: Payer: Self-pay

## 2022-11-08 VITALS — BP 116/82 | HR 106 | Temp 98.0°F | Resp 17 | Ht 62.0 in | Wt 155.8 lb

## 2022-11-08 VITALS — HR 83 | Resp 18

## 2022-11-08 DIAGNOSIS — C3412 Malignant neoplasm of upper lobe, left bronchus or lung: Secondary | ICD-10-CM | POA: Diagnosis present

## 2022-11-08 DIAGNOSIS — Z79899 Other long term (current) drug therapy: Secondary | ICD-10-CM | POA: Insufficient documentation

## 2022-11-08 DIAGNOSIS — C7931 Secondary malignant neoplasm of brain: Secondary | ICD-10-CM | POA: Diagnosis not present

## 2022-11-08 DIAGNOSIS — C3492 Malignant neoplasm of unspecified part of left bronchus or lung: Secondary | ICD-10-CM

## 2022-11-08 DIAGNOSIS — C3490 Malignant neoplasm of unspecified part of unspecified bronchus or lung: Secondary | ICD-10-CM | POA: Diagnosis not present

## 2022-11-08 DIAGNOSIS — Z5112 Encounter for antineoplastic immunotherapy: Secondary | ICD-10-CM | POA: Insufficient documentation

## 2022-11-08 DIAGNOSIS — Z5111 Encounter for antineoplastic chemotherapy: Secondary | ICD-10-CM | POA: Insufficient documentation

## 2022-11-08 LAB — CMP (CANCER CENTER ONLY)
ALT: 30 U/L (ref 0–44)
AST: 42 U/L — ABNORMAL HIGH (ref 15–41)
Albumin: 3.2 g/dL — ABNORMAL LOW (ref 3.5–5.0)
Alkaline Phosphatase: 90 U/L (ref 38–126)
Anion gap: 8 (ref 5–15)
BUN: 19 mg/dL (ref 8–23)
CO2: 27 mmol/L (ref 22–32)
Calcium: 9 mg/dL (ref 8.9–10.3)
Chloride: 101 mmol/L (ref 98–111)
Creatinine: 0.78 mg/dL (ref 0.44–1.00)
GFR, Estimated: >60 mL/min (ref >60)
Glucose, Bld: 123 mg/dL — ABNORMAL HIGH (ref 70–99)
Potassium: 3.3 mmol/L — ABNORMAL LOW (ref 3.5–5.1)
Sodium: 136 mmol/L (ref 135–145)
Total Bilirubin: 0.7 mg/dL (ref 0.3–1.2)
Total Protein: 6.6 g/dL (ref 6.5–8.1)

## 2022-11-08 LAB — CBC WITH DIFFERENTIAL (CANCER CENTER ONLY)
Abs Immature Granulocytes: 0.04 10*3/uL (ref 0.00–0.07)
Basophils Absolute: 0 10*3/uL (ref 0.0–0.1)
Basophils Relative: 0 %
Eosinophils Absolute: 0 10*3/uL (ref 0.0–0.5)
Eosinophils Relative: 1 %
HCT: 26.8 % — ABNORMAL LOW (ref 36.0–46.0)
Hemoglobin: 9.1 g/dL — ABNORMAL LOW (ref 12.0–15.0)
Immature Granulocytes: 1 %
Lymphocytes Relative: 24 %
Lymphs Abs: 1.6 10*3/uL (ref 0.7–4.0)
MCH: 35.3 pg — ABNORMAL HIGH (ref 26.0–34.0)
MCHC: 34 g/dL (ref 30.0–36.0)
MCV: 103.9 fL — ABNORMAL HIGH (ref 80.0–100.0)
Monocytes Absolute: 0.5 10*3/uL (ref 0.1–1.0)
Monocytes Relative: 7 %
Neutro Abs: 4.6 10*3/uL (ref 1.7–7.7)
Neutrophils Relative %: 67 %
Platelet Count: 366 10*3/uL (ref 150–400)
RBC: 2.58 MIL/uL — ABNORMAL LOW (ref 3.87–5.11)
RDW: 20.1 % — ABNORMAL HIGH (ref 11.5–15.5)
WBC Count: 6.7 10*3/uL (ref 4.0–10.5)
nRBC: 0 % (ref 0.0–0.2)

## 2022-11-08 MED ORDER — SODIUM CHLORIDE 0.9% FLUSH: 10.0000 mL | INTRAVENOUS | Status: DC | PRN

## 2022-11-08 MED ORDER — PROCHLORPERAZINE MALEATE 10 MG PO TABS
10.0000 mg | ORAL_TABLET | Freq: Once | ORAL | Status: AC
  Administered 2022-11-08: 10 mg via ORAL
  Filled 2022-11-08: qty 1

## 2022-11-08 MED ORDER — SODIUM CHLORIDE 0.9 % IV SOLN
400.0000 mg/m2 | Freq: Once | INTRAVENOUS | Status: AC
  Administered 2022-11-08: 700 mg via INTRAVENOUS
  Filled 2022-11-08: qty 20

## 2022-11-08 MED ORDER — SODIUM CHLORIDE 0.9 % IV SOLN: INTRAVENOUS | Status: AC

## 2022-11-08 MED ORDER — SODIUM CHLORIDE 0.9 % IV SOLN: Freq: Once | INTRAVENOUS | Status: AC

## 2022-11-08 MED ORDER — HEPARIN SOD (PORK) LOCK FLUSH 100 UNIT/ML IV SOLN: 500.0000 [IU] | Freq: Once | INTRAVENOUS | Status: DC | PRN

## 2022-11-08 MED ORDER — SODIUM CHLORIDE 0.9 % IV SOLN
200.0000 mg | Freq: Once | INTRAVENOUS | Status: AC
  Administered 2022-11-08: 200 mg via INTRAVENOUS
  Filled 2022-11-08: qty 200

## 2022-11-08 NOTE — Progress Notes (Signed)
Patient seen by Dr. Gypsy Balsam are not all within treatment parameters. Heart rate = 106. Dr Arbutus Ped ordered 1 liter of normal saline to be given today with treatment plan..   Labs reviewed: and are within treatment parameters.  Per physician team, pt is ready for treatment .  Per Dr. Arbutus Ped ,it is ok to treat pt today with Keytruda and Alimta  and heart rate of 106.  There is an additional order to administer 1 liter of normal saline  today for tachycardia.Marland Kitchen

## 2022-11-08 NOTE — Progress Notes (Signed)
[facility] [phone]   [phone]  OFFICE PROGRESS NOTE  Patient, No Pcp Per No address on file  DIAGNOSIS: Stage IV (T1c, N2, M1 C) non-small cell lung cancer, adenocarcinoma with positive KRAS G12C mutation diagnosed in September 2023 and presented with left upper lobe lung mass in addition to left hilar and mediastinal lymphadenopathy as well as innumerable brain metastasis.  PRIOR THERAPY: SRS to multiple brain metastasis under the care of Dr. Basilio Cairo on February 24, 2022.  CURRENT THERAPY: Palliative systemic chemotherapy with carboplatin for AUC of 5, Alimta 500 Mg/M2 and Keytruda 200 Mg IV every 3 weeks.  Status post 12 cycles.  Starting from cycle #5 the patient will be on maintenance treatment with Alimta and Keytruda every 3 weeks.  INTERVAL HISTORY: Kimberly Huang 72 y.o. female returns to the clinic today for follow-up visit accompanied by her Sister Inetta Fermo.  The patient is feeling fine today with no concerning complaints except for the baseline fatigue.  She declined to proceed with her palliative radiotherapy to the mediastinal lymph nodes.  She denied having any current chest pain, shortness of breath except with exertion with no cough or hemoptysis.  She has no nausea, vomiting, diarrhea or constipation.  She has no headache or visual changes.  She has no recent weight loss or night sweats.  She is here today for evaluation before starting cycle #13 of her treatment.  MEDICAL HISTORY: Past Medical History:  Diagnosis Date   Arthritis    knee, hands   CAD S/P PCI OM1 99%-0%; Plan Staged PCI mRCA 90%. 02/19/2017   1. Severe 2 vessel obstructive CAD    - 99% thrombotic occlusion of first OM. This is a bifurcating vessel.     - 90% mid RCA-segmental 2. Good overall LV dysfunction with lateral HK 3. Moderately elevated LVEDP 4. Successful stenting of the first OM with DES. Distal embolization into the distal lateral OM branch.   Plan: DAPT  for one year. Will treat with IV Aggrastat for 18 hours. Start high dose statin, beta blocker. IV Ntg for BP control acutely. Plan for stage PCI of RCA on Monday 10/22 if no complication.   Essential hypertension 02/21/2017   Hyperlipidemia with target LDL less than 70 02/21/2017   Lung cancer (HCC)    Presence of drug-eluting stent in L Cx: OM1 (Xience SIERRA DES 3.5 x 15) 02/21/2017   STEMI involving left circumflex coronary artery (HCC) 02/19/2017   Tobacco abuse 02/19/2017    ALLERGIES:  is allergic to codeine.  MEDICATIONS:  Current Outpatient Medications  Medication Sig Dispense Refill   acetaminophen (TYLENOL) 500 MG tablet Take 500 mg by mouth as needed for moderate pain.     aspirin 81 MG chewable tablet Chew 1 tablet (81 mg total) by mouth daily.     atorvastatin (LIPITOR) 80 MG tablet TAKE 1 TABLET BY MOUTH DAILY AT 6 PM. 90 tablet 3   Coenzyme Q10 (CO Q 10) 100 MG CAPS Take 100 mg by mouth every evening.     diclofenac Sodium (VOLTAREN) 1 % GEL Apply 1 Application topically daily as needed (Knee pain).     ezetimibe (ZETIA) 10 MG tablet TAKE 1 TABLET BY MOUTH EVERY DAY 90 tablet 0   folic acid (FOLVITE) 1 MG tablet Take 1 tablet (1 mg total) by mouth daily. 90 tablet 1   furosemide (LASIX) 20 MG tablet Once daily only as needed for swelling of the lower extremities  30 tablet 6   irbesartan (AVAPRO) 150 MG tablet Take 1 tablet (150 mg total) by mouth daily. 90 tablet 3   levETIRAcetam (KEPPRA) 500 MG tablet TAKE 1 TABLET BY MOUTH TWICE A DAY 180 tablet 1   metoprolol tartrate (LOPRESSOR) 25 MG tablet TAKE 1 TABLET BY MOUTH TWICE A DAY 90 tablet 3   mirtazapine (REMERON) 15 MG tablet Take 1 tablet (15 mg total) by mouth at bedtime. 30 tablet 1   nitroGLYCERIN (NITROSTAT) 0.4 MG SL tablet Place 1 tablet (0.4 mg total) under the tongue every 5 (five) minutes x 3 doses as needed for chest pain. 25 tablet 2   ondansetron (ZOFRAN) 8 MG tablet Take 1 tablet (8 mg total) by mouth every  8 (eight) hours as needed for nausea or vomiting. Starting day 3 after chemotherapy (Patient not taking: Reported on 10/18/2022) 30 tablet 2   pantoprazole (PROTONIX) 40 MG tablet TAKE 1 TABLET BY MOUTH EVERY DAY 90 tablet 1   prochlorperazine (COMPAZINE) 10 MG tablet TAKE 1 TABLET BY MOUTH EVERY 6 HOURS AS NEEDED 30 tablet 2   traMADol (ULTRAM) 50 MG tablet Take 50 mg by mouth daily as needed for moderate pain. (Patient not taking: Reported on 10/18/2022)     No current facility-administered medications for this visit.    SURGICAL HISTORY:  Past Surgical History:  Procedure Laterality Date   BRONCHIAL BIOPSY  01/15/2022   Procedure: BRONCHIAL BIOPSIES;  Surgeon: Omar Person, MD;  Location: [facility] ENDOSCOPY;  Service: Pulmonary;;   BRONCHIAL BRUSHINGS  01/15/2022   Procedure: BRONCHIAL BRUSHINGS;  Surgeon: Omar Person, MD;  Location: [facility] ENDOSCOPY;  Service: Pulmonary;;   BRONCHIAL NEEDLE ASPIRATION BIOPSY  01/15/2022   Procedure: BRONCHIAL NEEDLE ASPIRATION BIOPSIES;  Surgeon: Omar Person, MD;  Location: [facility] ENDOSCOPY;  Service: Pulmonary;;   CARDIAC CATHETERIZATION  02/21/2017   CHOLECYSTECTOMY     CORONARY STENT INTERVENTION N/A 02/19/2017   Procedure: CORONARY STENT INTERVENTION;  Surgeon: Swaziland, Peter M, MD;  Location: [facility] INVASIVE CV LAB;  Service: Cardiovascular;  Laterality: N/A;   CORONARY STENT INTERVENTION N/A 02/21/2017   Procedure: CORONARY STENT INTERVENTION;  Surgeon: Lyn Records, MD;  Location: [facility] INVASIVE CV LAB;  Service: Cardiovascular;  Laterality: N/A;   FIDUCIAL MARKER PLACEMENT  01/15/2022   Procedure: FIDUCIAL MARKER PLACEMENT;  Surgeon: Omar Person, MD;  Location: [facility] ENDOSCOPY;  Service: Pulmonary;;   FINE NEEDLE ASPIRATION  01/15/2022   Procedure: FINE NEEDLE ASPIRATION;  Surgeon: Omar Person, MD;  Location: [facility] ENDOSCOPY;  Service: Pulmonary;;   HEMOSTASIS CONTROL  01/15/2022   Procedure: HEMOSTASIS CONTROL;  Surgeon: Omar Person,  MD;  Location: [facility] ENDOSCOPY;  Service: Pulmonary;;   KNEE ARTHROSCOPY Right 2010   KNEE ARTHROSCOPY WITH SUBCHONDROPLASTY Left 01/09/2020   Procedure: Left knee arthroscopy,partial medial meniscectomy, debridement, subchondroplasty left proximal tibia;  Surgeon: Jene Every, MD;  Location: [name];  Service: Orthopedics;  Laterality: Left;   LEFT HEART CATH AND CORONARY ANGIOGRAPHY N/A 02/19/2017   Procedure: LEFT HEART CATH AND CORONARY ANGIOGRAPHY;  Surgeon: Swaziland, Peter M, MD;  Location: [facility] INVASIVE CV LAB;  Service: Cardiovascular;  Laterality: N/A;   TONSILLECTOMY     TUBAL LIGATION     VIDEO BRONCHOSCOPY WITH ENDOBRONCHIAL ULTRASOUND N/A 01/15/2022   Procedure: VIDEO BRONCHOSCOPY WITH ENDOBRONCHIAL ULTRASOUND;  Surgeon: Omar Person, MD;  Location: [facility] ENDOSCOPY;  Service: Pulmonary;  Laterality: N/A;   VIDEO BRONCHOSCOPY WITH RADIAL ENDOBRONCHIAL ULTRASOUND  01/15/2022   Procedure: RADIAL ENDOBRONCHIAL  ULTRASOUND;  Surgeon: Omar Person, MD;  Location: [facility] ENDOSCOPY;  Service: Pulmonary;;    REVIEW OF SYSTEMS:  A comprehensive review of systems was negative except for: Constitutional: positive for fatigue Musculoskeletal: positive for muscle weakness   PHYSICAL EXAMINATION: General appearance: alert, cooperative, fatigued, and no distress Head: Normocephalic, without obvious abnormality, atraumatic Neck: no adenopathy, no JVD, supple, symmetrical, trachea midline, and thyroid not enlarged, symmetric, no tenderness/mass/nodules Lymph nodes: Cervical, supraclavicular, and axillary nodes normal. Resp: clear to auscultation bilaterally Back: symmetric, no curvature. ROM normal. No CVA tenderness. Cardio: regular rate and rhythm, S1, S2 normal, no murmur, click, rub or gallop GI: soft, non-tender; bowel sounds normal; no masses,  no organomegaly Extremities: edema trace edema bilateral  ECOG PERFORMANCE STATUS: 1 - Symptomatic but completely ambulatory  Blood pressure  116/82, pulse (!) 106, temperature 98 F (36.7 C), temperature source Oral, resp. rate 17, height 5\' 2"  (1.575 m), weight 155 lb 12.8 oz (70.7 kg), SpO2 97 %.  LABORATORY DATA: Lab Results  Component Value Date   WBC 6.7 11/08/2022   HGB 9.1 (L) 11/08/2022   HCT 26.8 (L) 11/08/2022   MCV 103.9 (H) 11/08/2022   PLT 366 11/08/2022      Chemistry      Component Value Date/Time   NA 136 11/08/2022 1053   NA 138 07/19/2022 1620   K 3.3 (L) 11/08/2022 1053   CL 101 11/08/2022 1053   CO2 27 11/08/2022 1053   BUN 19 11/08/2022 1053   BUN 8 07/19/2022 1620   CREATININE 0.78 11/08/2022 1053      Component Value Date/Time   CALCIUM 9.0 11/08/2022 1053   ALKPHOS 90 11/08/2022 1053   AST 42 (H) 11/08/2022 1053   ALT 30 11/08/2022 1053   BILITOT 0.7 11/08/2022 1053       RADIOGRAPHIC STUDIES: No results found.  ASSESSMENT AND PLAN: This is a very pleasant 72 years old white female with Stage IV (T1c, N2, M1 C) non-small cell lung cancer, adenocarcinoma with positive KRAS G12C mutation diagnosed in September 2023 and presented with left upper lobe lung mass in addition to left hilar and mediastinal lymphadenopathy as well as innumerable brain metastasis. The patient is scheduled for SRS to multiple brain metastasis on February 24, 2022. The patient is currently undergoing systemic chemotherapy with carboplatin for AUC of 5, Alimta 500 Mg/M2 and Keytruda 200 Mg IV every 3 weeks status post 12 cycles.  Starting from cycle #5 she is on maintenance treatment with Alimta and Keytruda every 3 weeks. She continues to tolerate this treatment well with no concerning complaints except for the fatigue and occasional lack of appetite and dehydration. She is feeling much better today and would like to proceed with cycle #13 as planned but I will reduce the dose of Alimta to 400 Mg/M2 starting from cycle #13 because of intolerance. I will see her back for follow-up visit in 3 weeks for evaluation with  repeat CT scan of the chest, abdomen and pelvis for restaging of her disease. If the patient had evidence for disease progression, I would consider her for treatment with targeted therapy for the positive KRAS G12C mutation. For the tachycardia, I will arrange for the patient to receive 1 L of normal saline in the clinic today. The patient was advised to call immediately if she has any other concerning symptoms in the interval. The patient voices understanding of current disease status and treatment options and is in agreement with the current care plan.  All questions were answered. The patient knows to call the clinic with any problems, questions or concerns. We can certainly see the patient much sooner if necessary.  The total time spent in the appointment was 30 minutes.  Disclaimer: This note was dictated with voice recognition software. Similar sounding words can inadvertently be transcribed and may not be corrected upon review.

## 2022-11-08 NOTE — Progress Notes (Signed)
Per Dr. Arbutus Ped,  please use BSA of 1.76 m2 for reduced Alimta dose calculation and ok to administer normal saline over 1.5 hours.

## 2022-11-08 NOTE — Progress Notes (Signed)
Per Arbutus Ped MD, ok to treat with HR 106. Pt to receive 1L NS while in infusion clinic.

## 2022-11-08 NOTE — Progress Notes (Signed)
OK to use most recent BSA = 1.76 m2 per Dr. Arbutus Ped.  Ebony Hail, Pharm.D., CPP 11/08/2022@12 :08 PM

## 2022-11-09 ENCOUNTER — Ambulatory Visit: Payer: Medicare Other | Admitting: Radiation Oncology

## 2022-11-10 ENCOUNTER — Ambulatory Visit: Payer: Medicare Other | Admitting: Radiation Oncology

## 2022-11-11 ENCOUNTER — Ambulatory Visit: Payer: Medicare Other | Admitting: Radiation Oncology

## 2022-11-11 NOTE — Progress Notes (Signed)
I told pt that I called 3 Home Health  agencies . All 3 are unable to take on another pt due to staffing. Pt declined HH referral at this time. I told her to please let us know if she changes her mind.

## 2022-11-12 ENCOUNTER — Ambulatory Visit: Payer: Medicare Other | Admitting: Radiation Oncology

## 2022-11-15 ENCOUNTER — Encounter: Payer: Self-pay | Admitting: Radiation Oncology

## 2022-11-16 ENCOUNTER — Encounter: Payer: Self-pay | Admitting: Radiation Oncology

## 2022-11-17 ENCOUNTER — Encounter: Payer: Self-pay | Admitting: Internal Medicine

## 2022-11-17 ENCOUNTER — Encounter: Payer: Self-pay | Admitting: Radiation Oncology

## 2022-11-18 ENCOUNTER — Telehealth: Payer: Self-pay | Admitting: *Deleted

## 2022-11-18 ENCOUNTER — Telehealth: Payer: Self-pay

## 2022-11-18 ENCOUNTER — Encounter: Payer: Self-pay | Admitting: Radiation Oncology

## 2022-11-18 NOTE — Telephone Encounter (Signed)
TC from pt's daughter Greta Doom to report concerns regarding pt. She states that pt mixed up her morning and night medications yesterday and believes this may have contributed to her falling last night and scratching her hand/wrist. Wound is minor and scabbed over per daughter, and pt does not have pain or bruising that would indicate further injury. Daughter also wanted to express concerns regarding pt's declining condition, stating she seems to get confused at times. Family believes she needs additional care at home and reached out to a palliative care agency who stated they were understaffed and could not assist them. Daughter is inquiring about hospice care and whether that would be appropriate for pt at this time. Advised that concerns can be discussed w/ Dr. Barbaraann Cao at pt's appt on 11/22/22.

## 2022-11-18 NOTE — Telephone Encounter (Signed)
PC to patient, no answer, left VM - informed patient her appointment with Dr Barbaraann Cao has been canceled because her brain MRI wasn't scheduled.  Instructed patient to call Central Scheduling to schedule the MRI & then we will schedule her MD visit, [phone].  Instructed patient to call this office with any further questions/concerns, [phone].

## 2022-11-19 ENCOUNTER — Encounter: Payer: Self-pay | Admitting: Radiation Oncology

## 2022-11-20 ENCOUNTER — Ambulatory Visit (HOSPITAL_COMMUNITY)
Admission: RE | Admit: 2022-11-20 | Discharge: 2022-11-20 | Disposition: A | Discharge status: Home / Self Care | Payer: Medicare Other | Source: Ambulatory Visit | Attending: Internal Medicine | Admitting: Internal Medicine

## 2022-11-20 DIAGNOSIS — C7931 Secondary malignant neoplasm of brain: Secondary | ICD-10-CM | POA: Diagnosis not present

## 2022-11-20 MED ORDER — GADOBUTROL 1 MMOL/ML IV SOLN
10.0000 mL | Freq: Once | INTRAVENOUS | Status: AC | PRN
  Administered 2022-11-20: 7 mL via INTRAVENOUS

## 2022-11-22 ENCOUNTER — Encounter: Payer: Self-pay | Admitting: Radiation Oncology

## 2022-11-22 ENCOUNTER — Inpatient Hospital Stay: Payer: Medicare Other

## 2022-11-22 ENCOUNTER — Ambulatory Visit: Payer: Medicare Other | Admitting: Internal Medicine

## 2022-11-25 ENCOUNTER — Other Ambulatory Visit: Payer: Self-pay | Admitting: Physician Assistant

## 2022-11-25 DIAGNOSIS — R630 Anorexia: Secondary | ICD-10-CM

## 2022-11-26 ENCOUNTER — Other Ambulatory Visit: Payer: Self-pay | Admitting: Physician Assistant

## 2022-11-26 ENCOUNTER — Ambulatory Visit (HOSPITAL_COMMUNITY): Admission: RE | Admit: 2022-11-26 | Payer: Medicare Other | Source: Ambulatory Visit

## 2022-11-26 DIAGNOSIS — C3492 Malignant neoplasm of unspecified part of left bronchus or lung: Secondary | ICD-10-CM

## 2022-11-28 NOTE — Progress Notes (Deleted)
Cardiology Clinic Note   Patient Name: Kimberly Huang Date of Encounter: 11/28/2022  Primary Care Provider:  Patient, No Pcp Per Primary Cardiologist:  Peter Swaziland, MD  Patient Profile    72 year old female with history of hypertension, hyperlipidemia, coronary artery disease with posterior STEMI in October 2018, with DES to her left circumflex (3.5 x 15 mm), with residual RCA disease that was intervened on 48 hours later (with 3.0 x 26 Onyx stent) to the proximal to mid RCA on 02/21/2017 currently on aspirin only.   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  On last visit with Dr. Swaziland, she was complaining of lower extremity edema.  Irbesartan was reduced to 150 mg daily.  She was to continue Lasix 20 mg daily.  She was counseled on smoking cessation and sodium restriction.  Compression hose were recommended  Past Medical History    Past Medical History:  Diagnosis Date   Arthritis    knee, hands   CAD S/P PCI OM1 99%-0%; Plan Staged PCI mRCA 90%. 02/19/2017   1. Severe 2 vessel obstructive CAD    - 99% thrombotic occlusion of first OM. This is a bifurcating vessel.     - 90% mid RCA-segmental 2. Good overall LV dysfunction with lateral HK 3. Moderately elevated LVEDP 4. Successful stenting of the first OM with DES. Distal embolization into the distal lateral OM branch.   Plan: DAPT for one year. Will treat with IV Aggrastat for 18 hours. Start high dose statin, beta blocker. IV Ntg for BP control acutely. Plan for stage PCI of RCA on Monday 10/22 if no complication.   Essential hypertension 02/21/2017   Hyperlipidemia with target LDL less than 70 02/21/2017   Lung cancer (HCC)    Presence of drug-eluting stent in L Cx: OM1 (Xience SIERRA DES 3.5 x 15) 02/21/2017   STEMI involving left circumflex coronary artery (HCC) 02/19/2017   Tobacco abuse 02/19/2017   Past Surgical History:  Procedure Laterality Date    BRONCHIAL BIOPSY  01/15/2022   Procedure: BRONCHIAL BIOPSIES;  Surgeon: Omar Person, MD;  Location: [facility] ENDOSCOPY;  Service: Pulmonary;;   BRONCHIAL BRUSHINGS  01/15/2022   Procedure: BRONCHIAL BRUSHINGS;  Surgeon: Omar Person, MD;  Location: [facility] ENDOSCOPY;  Service: Pulmonary;;   BRONCHIAL NEEDLE ASPIRATION BIOPSY  01/15/2022   Procedure: BRONCHIAL NEEDLE ASPIRATION BIOPSIES;  Surgeon: Omar Person, MD;  Location: [facility] ENDOSCOPY;  Service: Pulmonary;;   CARDIAC CATHETERIZATION  02/21/2017   CHOLECYSTECTOMY     CORONARY STENT INTERVENTION N/A 02/19/2017   Procedure: CORONARY STENT INTERVENTION;  Surgeon: Swaziland, Peter M, MD;  Location: [facility] INVASIVE CV LAB;  Service: Cardiovascular;  Laterality: N/A;   CORONARY STENT INTERVENTION N/A 02/21/2017   Procedure: CORONARY STENT INTERVENTION;  Surgeon: Lyn Records, MD;  Location: [facility] INVASIVE CV LAB;  Service: Cardiovascular;  Laterality: N/A;   FIDUCIAL MARKER PLACEMENT  01/15/2022   Procedure: FIDUCIAL MARKER PLACEMENT;  Surgeon: Omar Person, MD;  Location: [facility] ENDOSCOPY;  Service: Pulmonary;;   FINE NEEDLE ASPIRATION  01/15/2022   Procedure: FINE NEEDLE ASPIRATION;  Surgeon: Omar Person, MD;  Location: [facility] ENDOSCOPY;  Service: Pulmonary;;   HEMOSTASIS CONTROL  01/15/2022   Procedure: HEMOSTASIS CONTROL;  Surgeon: Omar Person, MD;  Location: [facility] ENDOSCOPY;  Service: Pulmonary;;   KNEE ARTHROSCOPY Right 2010   KNEE ARTHROSCOPY WITH SUBCHONDROPLASTY Left 01/09/2020   Procedure: Left knee arthroscopy,partial medial  meniscectomy, debridement, subchondroplasty left proximal tibia;  Surgeon: Jene Every, MD;  Location: [name];  Service: Orthopedics;  Laterality: Left;   LEFT HEART CATH AND CORONARY ANGIOGRAPHY N/A 02/19/2017   Procedure: LEFT HEART CATH AND CORONARY ANGIOGRAPHY;  Surgeon: Swaziland, Peter M, MD;  Location: [facility] INVASIVE CV LAB;  Service: Cardiovascular;  Laterality: N/A;   TONSILLECTOMY     TUBAL LIGATION      VIDEO BRONCHOSCOPY WITH ENDOBRONCHIAL ULTRASOUND N/A 01/15/2022   Procedure: VIDEO BRONCHOSCOPY WITH ENDOBRONCHIAL ULTRASOUND;  Surgeon: Omar Person, MD;  Location: [facility] ENDOSCOPY;  Service: Pulmonary;  Laterality: N/A;   VIDEO BRONCHOSCOPY WITH RADIAL ENDOBRONCHIAL ULTRASOUND  01/15/2022   Procedure: RADIAL ENDOBRONCHIAL ULTRASOUND;  Surgeon: Omar Person, MD;  Location: [facility] ENDOSCOPY;  Service: Pulmonary;;    Allergies  Allergies  Allergen Reactions   Codeine Nausea Only    History of Present Illness    Mrs. Kimberly Huang returns today for ongoing assessment and management of hypertension, CAD with DES to the right coronary artery (2 separate procedures as above), hyperlipidemia, chronic lower extremity edema, and ongoing tobacco abuse.  She is also being followed by oncology for stage IV small cell lung cancer with brain METS.  Last seen in the office by Dr. Swaziland on 07/19/2022.  He felt that the lower extremity edema was related to chemotherapy, echocardiogram was ordered.  Echo on 08/03/2022 revealed normal LV systolic function of 65 to 70% with grade 1 diastolic dysfunction.  Home Medications    Current Outpatient Medications  Medication Sig Dispense Refill   acetaminophen (TYLENOL) 500 MG tablet Take 500 mg by mouth as needed for moderate pain.     aspirin 81 MG chewable tablet Chew 1 tablet (81 mg total) by mouth daily.     atorvastatin (LIPITOR) 80 MG tablet TAKE 1 TABLET BY MOUTH DAILY AT 6 PM. 90 tablet 3   Coenzyme Q10 (CO Q 10) 100 MG CAPS Take 100 mg by mouth every evening.     diclofenac Sodium (VOLTAREN) 1 % GEL Apply 1 Application topically daily as needed (Knee pain).     ezetimibe (ZETIA) 10 MG tablet TAKE 1 TABLET BY MOUTH EVERY DAY 90 tablet 0   folic acid (FOLVITE) 1 MG tablet Take 1 tablet (1 mg total) by mouth daily. 90 tablet 1   furosemide (LASIX) 20 MG tablet Once daily only as needed for swelling of the lower extremities 30 tablet 6   irbesartan (AVAPRO) 150  MG tablet Take 1 tablet (150 mg total) by mouth daily. 90 tablet 3   levETIRAcetam (KEPPRA) 500 MG tablet TAKE 1 TABLET BY MOUTH TWICE A DAY 180 tablet 1   metoprolol tartrate (LOPRESSOR) 25 MG tablet TAKE 1 TABLET BY MOUTH TWICE A DAY 90 tablet 3   mirtazapine (REMERON) 15 MG tablet TAKE 1 TABLET BY MOUTH EVERYDAY AT BEDTIME 90 tablet 1   nitroGLYCERIN (NITROSTAT) 0.4 MG SL tablet Place 1 tablet (0.4 mg total) under the tongue every 5 (five) minutes x 3 doses as needed for chest pain. 25 tablet 2   ondansetron (ZOFRAN) 8 MG tablet Take 1 tablet (8 mg total) by mouth every 8 (eight) hours as needed for nausea or vomiting. Starting day 3 after chemotherapy (Patient not taking: Reported on 10/18/2022) 30 tablet 2   pantoprazole (PROTONIX) 40 MG tablet TAKE 1 TABLET BY MOUTH EVERY DAY 90 tablet 1   prochlorperazine (COMPAZINE) 10 MG tablet TAKE 1 TABLET BY MOUTH EVERY 6 HOURS AS NEEDED 30 tablet 2  traMADol (ULTRAM) 50 MG tablet Take 50 mg by mouth daily as needed for moderate pain. (Patient not taking: Reported on 10/18/2022)     No current facility-administered medications for this visit.     Family History    Family History  Problem Relation Age of Onset   Diabetes Mother    Lung cancer Father    Heart attack Sister    She indicated that her mother is deceased. She indicated that her father is deceased. She indicated that her sister is alive. She indicated that her brother is alive. She indicated that her maternal grandmother is deceased. She indicated that her maternal grandfather is deceased. She indicated that her paternal grandmother is deceased. She indicated that her paternal grandfather is deceased. She indicated that all of her four others are alive.  Social History    Social History   Socioeconomic History   Marital status: Married    Spouse name: Not on file   Number of children: 3   Years of education: Not on file   Highest education level: Not on file  Occupational History    Not on file  Tobacco Use   Smoking status: Some Days    Current packs/day: 1.50    Average packs/day: 1.5 packs/day for 50.0 years (75.0 ttl pk-yrs)    Types: Cigarettes   Smokeless tobacco: Never   Tobacco comments:    Trying to quit, currently smokes 5-6 cigarettes per day 01/21/2022  Vaping Use   Vaping status: Never Used  Substance and Sexual Activity   Alcohol use: Yes    Comment: occasional   Drug use: Never   Sexual activity: Not Currently    Birth control/protection: Surgical    Comment: Hysterectomy//Tubal Ligation  Other Topics Concern   Not on file  Social History Narrative   Family runs the [employer].   Social Determinants of Health   Financial Resource Strain: Not on file  Food Insecurity: No Food Insecurity (10/05/2022)   Hunger Vital Sign    Worried About Running Out of Food in the Last Year: Never true    Ran Out of Food in the Last Year: Never true  Transportation Needs: No Transportation Needs (10/05/2022)   PRAPARE - Administrator, Civil Service (Medical): No    Lack of Transportation (Non-Medical): No  Physical Activity: Not on file  Stress: Not on file  Social Connections: Not on file  Intimate Partner Violence: Not At Risk (10/05/2022)   Humiliation, Afraid, Rape, and Kick questionnaire    Fear of Current or Ex-Partner: No    Emotionally Abused: No    Physically Abused: No    Sexually Abused: No     Review of Systems    General:  No chills, fever, night sweats or weight changes.  Cardiovascular:  No chest pain, dyspnea on exertion, edema, orthopnea, palpitations, paroxysmal nocturnal dyspnea. Dermatological: No rash, lesions/masses Respiratory: No cough, dyspnea Urologic: No hematuria, dysuria Abdominal:   No nausea, vomiting, diarrhea, bright red blood per rectum, melena, or hematemesis Neurologic:  No visual changes, wkns, changes in mental status. All other systems reviewed and are otherwise negative except as noted  above.       Physical Exam    VS:  There were no vitals taken for this visit. , BMI There is no height or weight on file to calculate BMI.     GEN: Well nourished, well developed, in no acute distress. HEENT: normal. Neck: Supple, no JVD, carotid bruits, or  masses. Cardiac: RRR, no murmurs, rubs, or gallops. No clubbing, cyanosis, edema.  Radials/DP/PT 2+ and equal bilaterally.  Respiratory:  Respirations regular and unlabored, clear to auscultation bilaterally. GI: Soft, nontender, nondistended, BS + x 4. MS: no deformity or atrophy. Skin: warm and dry, no rash. Neuro:  Strength and sensation are intact. Psych: Normal affect.      Lab Results  Component Value Date   WBC 6.7 11/08/2022   HGB 9.1 (L) 11/08/2022   HCT 26.8 (L) 11/08/2022   MCV 103.9 (H) 11/08/2022   PLT 366 11/08/2022   Lab Results  Component Value Date   CREATININE 0.78 11/08/2022   BUN 19 11/08/2022   NA 136 11/08/2022   K 3.3 (L) 11/08/2022   CL 101 11/08/2022   CO2 27 11/08/2022   Lab Results  Component Value Date   ALT 30 11/08/2022   AST 42 (H) 11/08/2022   ALKPHOS 90 11/08/2022   BILITOT 0.7 11/08/2022   Lab Results  Component Value Date   CHOL 132 07/19/2022   HDL 60 07/19/2022   LDLCALC 53 07/19/2022   TRIG 106 07/19/2022   CHOLHDL 2.2 07/19/2022    Lab Results  Component Value Date   HGBA1C 5.5 02/19/2017     Review of Prior Studies  Echocardiogram 08/03/2022 1. Left ventricular ejection fraction, by estimation, is 65 to 70%. The  left ventricle has normal function. The left ventricle has no regional  wall motion abnormalities. Left ventricular diastolic parameters are  consistent with Grade I diastolic  dysfunction (impaired relaxation).   2. Right ventricular systolic function is normal. The right ventricular  size is normal. Tricuspid regurgitation signal is inadequate for assessing  PA pressure.   3. The mitral valve is normal in structure. No evidence of mitral valve   regurgitation. No evidence of mitral stenosis.   4. The aortic valve is normal in structure. Aortic valve regurgitation is  not visualized. Aortic valve sclerosis/calcification is present, without  any evidence of aortic stenosis.   5. The inferior vena cava is normal in size with greater than 50%  respiratory variability, suggesting right atrial pressure of 3 mmHg.      Cath 02/19/2017 Dr. Swaziland  Conclusion      Prox LAD to Mid LAD lesion, 20 %stenosed. Prox RCA to Mid RCA lesion, 90 %stenosed. Prox Cx to Mid Cx lesion, 30 %stenosed. The left ventricular systolic function is normal. LV end diastolic pressure is moderately elevated. The left ventricular ejection fraction is 50-55% by visual estimate. 1st Mrg lesion, 99 %stenosed. A STENT SIERRA 3.50 X 15 MM drug eluting stent was successfully placed. Post intervention, there is a 0% residual stenosis. Lat 1st Mrg lesion, 100 %stenosed.   1. Severe 2 vessel obstructive CAD    - 99% thrombotic occlusion of first OM. This is a bifurcating vessel.     - 90% mid RCA-segmental 2. Good overall LV dysfunction with lateral HK 3. Moderately elevated LVEDP 4. Successful stenting of the first OM with DES. Distal embolization into the distal lateral OM branch.   Plan: DAPT for one year. Will treat with IV Aggrastat for 18 hours. Start high dose statin, beta blocker. IV Ntg for BP control acutely. Plan for stage PCI of RCA on Monday if no complication.        Cath 02/21/2017 Conclusion    Successful stent implantation in the proximal to mid RCA reducing a segmental 90% stenosis to 0% with TIMI grade 3 flow. Stent used was  a 3.0 x 26 Onyx post dilated to 14 atm 2.   RECOMMENDATIONS:   Continue dual antiplatelet therapy (aspirin and Brilinta)    Assessment & Plan   1.  ***     {Are you ordering a CV Procedure (e.g. stress test, cath, DCCV, TEE, etc)?   Press F2        :[id number]}   Signed, Bettey Mare. Liborio Nixon, ANP, AACC    11/28/2022 4:28 PM      Office [phone] Fax [phone]  Notice: This dictation was prepared with Dragon dictation along with smaller phrase technology. Any transcriptional errors that result from this process are unintentional and may not be corrected upon review.

## 2022-11-29 ENCOUNTER — Inpatient Hospital Stay: Payer: Medicare Other

## 2022-11-29 ENCOUNTER — Inpatient Hospital Stay: Payer: Medicare Other | Admitting: Internal Medicine

## 2022-11-29 ENCOUNTER — Inpatient Hospital Stay (HOSPITAL_BASED_OUTPATIENT_CLINIC_OR_DEPARTMENT_OTHER): Payer: Medicare Other | Admitting: Internal Medicine

## 2022-11-29 ENCOUNTER — Encounter: Payer: Self-pay | Admitting: Internal Medicine

## 2022-11-29 DIAGNOSIS — C7931 Secondary malignant neoplasm of brain: Secondary | ICD-10-CM | POA: Diagnosis not present

## 2022-11-29 NOTE — Progress Notes (Signed)
I connected with [name] on 11/29/22 at  2:30 PM EDT by telephone visit and verified that I am speaking with the correct person using two identifiers.  I discussed the limitations, risks, security and privacy concerns of performing an evaluation and management service by telemedicine and the availability of in-person appointments. I also discussed with the patient that there may be a patient responsible charge related to this service. The patient expressed understanding and agreed to proceed.  Other persons participating in the visit and their role in the encounter:  daughter  Patient's location:  Home Provider's location:  Office Chief Complaint:  Metastatic cancer to brain [facility]) - Plan: MR BRAIN W WO CONTRAST  History of Present Ilness: KYASIA ALWORTH reports no significant neurologic changes today.  Not requiring decadron.  Sleep has improved with the remeron.  No issues with chemo although she didn't feel up for coming in for treatment today.  Observations: Language and cognition at baseline  Imaging:  [facility] Clinician Interpretation: I have personally reviewed the CNS images as listed.  My interpretation, in the context of the patient's clinical presentation, is treatment effect vs true progression pending official read.  No results found.  Assessment and Plan: Metastatic cancer to brain [facility]) - Plan: MR BRAIN W WO CONTRAST  Clinically stable today.  MRI brain demonstrates some continued growth of the 5 "active" previously treated metastases.  When comparing to May and February studies, the rate of change has decreased.  This could be supportive of treatment effect, but growth of neoplasm remains on differential.  Official MRI read is pending.  We recommended staying off decadron for now, unless focal symptoms develop.    Follow Up Instructions: RTC in ~6 weks with repeat MRI brain study  I discussed the assessment and treatment plan with the patient.  The patient was provided  an opportunity to ask questions and all were answered.  The patient agreed with the plan and demonstrated understanding of the instructions.    The patient was advised to call back or seek an in-person evaluation if the symptoms worsen or if the condition fails to improve as anticipated.    Henreitta Leber, MD   I provided 23 minutes of non face-to-face telephone visit time during this encounter, and > 50% was spent counseling as documented under my assessment & plan.

## 2022-11-29 NOTE — Telephone Encounter (Signed)
Pt is not feeling well today. Requested phone visit with Dr Barbaraann Cao

## 2022-11-30 ENCOUNTER — Ambulatory Visit: Payer: Medicare Other | Admitting: Adult Health

## 2022-12-01 ENCOUNTER — Ambulatory Visit (HOSPITAL_BASED_OUTPATIENT_CLINIC_OR_DEPARTMENT_OTHER)
Admission: RE | Admit: 2022-12-01 | Discharge: 2022-12-01 | Disposition: A | Discharge status: Home / Self Care | Payer: Medicare Other | Source: Ambulatory Visit | Attending: Internal Medicine | Admitting: Internal Medicine

## 2022-12-01 DIAGNOSIS — C3490 Malignant neoplasm of unspecified part of unspecified bronchus or lung: Secondary | ICD-10-CM | POA: Diagnosis present

## 2022-12-01 MED ORDER — IOHEXOL 300 MG/ML  SOLN
100.0000 mL | Freq: Once | INTRAMUSCULAR | Status: AC | PRN
  Administered 2022-12-01: 100 mL via INTRAVENOUS

## 2022-12-02 ENCOUNTER — Telehealth: Payer: Self-pay | Admitting: Internal Medicine

## 2022-12-02 ENCOUNTER — Encounter: Payer: Self-pay | Admitting: Internal Medicine

## 2022-12-02 NOTE — Telephone Encounter (Signed)
Scheduled per 07/29 los, called patient and left a voicemail regarding upcoming appointments.

## 2022-12-07 ENCOUNTER — Encounter: Payer: Self-pay | Admitting: Internal Medicine

## 2022-12-08 ENCOUNTER — Telehealth: Payer: Self-pay | Admitting: Medical Oncology

## 2022-12-08 NOTE — Telephone Encounter (Signed)
I left message for dtr to call and confirm pt wants hospice and if so what county does she live.

## 2022-12-08 NOTE — Telephone Encounter (Signed)
Kimberly Huang stated [employer] Referral made .

## 2022-12-16 ENCOUNTER — Other Ambulatory Visit: Payer: Self-pay | Admitting: Cardiology

## 2022-12-17 ENCOUNTER — Other Ambulatory Visit: Payer: Self-pay | Admitting: *Deleted

## 2022-12-20 ENCOUNTER — Ambulatory Visit: Payer: Medicare Other

## 2022-12-20 ENCOUNTER — Other Ambulatory Visit: Payer: Medicare Other

## 2022-12-20 ENCOUNTER — Encounter: Payer: Medicare Other | Admitting: Nutrition

## 2022-12-20 ENCOUNTER — Ambulatory Visit: Payer: Medicare Other | Admitting: Internal Medicine

## 2023-01-02 DEATH — deceased

## 2023-01-07 ENCOUNTER — Encounter: Payer: Self-pay | Admitting: Internal Medicine

## 2023-01-07 ENCOUNTER — Encounter: Payer: Self-pay | Admitting: Physician Assistant

## 2023-01-10 ENCOUNTER — Ambulatory Visit: Payer: Medicare Other

## 2023-01-10 ENCOUNTER — Ambulatory Visit: Payer: Medicare Other | Admitting: Internal Medicine

## 2023-01-10 ENCOUNTER — Other Ambulatory Visit: Payer: Medicare Other

## 2023-01-10 NOTE — Progress Notes (Deleted)
Cardiology Office Note    Date:  01/10/2023   ID:  Kimberly Huang, DOB 06/14/1950, MRN [id number]  PCP:  Patient, No Pcp Per  Cardiologist:  Dr. Swaziland  No chief complaint on file.   History of Present Illness:  Kimberly Huang is a 72 y.o. female with past medical history of hypertension, hyperlipidemia, tobacco abuse and CAD.  Patient presented in October 2018 with chest pain and a posterior STEMI.  She ultimately underwent DES to her left circumflex, she also had residual RCA disease and that was intervened 48 hours later.  Ejection fraction was preserved on LV gram.    In September 2021 she underwent arthroscopic left knee surgery.   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 She had SSR for brain met. On therapy with Keytruda and alimta.  She is experiencing significant LE edema. Echo was done and was normal. She has been taking lasix occasionally but concerned about BP getting too low. She is due to have 3 more rounds of chemo. She denies any chest pain. States she has almost quit smoking.   Past Medical History:  Diagnosis Date   Arthritis    knee, hands   CAD S/P PCI OM1 99%-0%; Plan Staged PCI mRCA 90%. 02/19/2017   1. Severe 2 vessel obstructive CAD    - 99% thrombotic occlusion of first OM. This is a bifurcating vessel.     - 90% mid RCA-segmental 2. Good overall LV dysfunction with lateral HK 3. Moderately elevated LVEDP 4. Successful stenting of the first OM with DES. Distal embolization into the distal lateral OM branch.   Plan: DAPT for one year. Will treat with IV Aggrastat for 18 hours. Start high dose statin, beta blocker. IV Ntg for BP control acutely. Plan for stage PCI of RCA on Monday 10/22 if no complication.   Essential hypertension 02/21/2017   Hyperlipidemia with target LDL less than 70 02/21/2017   Lung cancer (HCC)    Presence of drug-eluting stent in L Cx: OM1 (Xience SIERRA DES 3.5 x 15) 02/21/2017   STEMI  involving left circumflex coronary artery (HCC) 02/19/2017   Tobacco abuse 02/19/2017    Past Surgical History:  Procedure Laterality Date   BRONCHIAL BIOPSY  01/15/2022   Procedure: BRONCHIAL BIOPSIES;  Surgeon: Omar Person, MD;  Location: [facility] ENDOSCOPY;  Service: Pulmonary;;   BRONCHIAL BRUSHINGS  01/15/2022   Procedure: BRONCHIAL BRUSHINGS;  Surgeon: Omar Person, MD;  Location: [facility] ENDOSCOPY;  Service: Pulmonary;;   BRONCHIAL NEEDLE ASPIRATION BIOPSY  01/15/2022   Procedure: BRONCHIAL NEEDLE ASPIRATION BIOPSIES;  Surgeon: Omar Person, MD;  Location: [facility] ENDOSCOPY;  Service: Pulmonary;;   CARDIAC CATHETERIZATION  02/21/2017   CHOLECYSTECTOMY     CORONARY STENT INTERVENTION N/A 02/19/2017   Procedure: CORONARY STENT INTERVENTION;  Surgeon: Swaziland, Carl Butner M, MD;  Location: [facility] INVASIVE CV LAB;  Service: Cardiovascular;  Laterality: N/A;   CORONARY STENT INTERVENTION N/A 02/21/2017   Procedure: CORONARY STENT INTERVENTION;  Surgeon: Lyn Records, MD;  Location: [facility] INVASIVE CV LAB;  Service: Cardiovascular;  Laterality: N/A;   FIDUCIAL MARKER PLACEMENT  01/15/2022   Procedure: FIDUCIAL MARKER PLACEMENT;  Surgeon: Omar Person, MD;  Location: [facility] ENDOSCOPY;  Service: Pulmonary;;   FINE NEEDLE ASPIRATION  01/15/2022   Procedure: FINE NEEDLE ASPIRATION;  Surgeon: Omar Person, MD;  Location: [facility] ENDOSCOPY;  Service: Pulmonary;;   HEMOSTASIS CONTROL  01/15/2022   Procedure: HEMOSTASIS  CONTROL;  Surgeon: Omar Person, MD;  Location: [facility] ENDOSCOPY;  Service: Pulmonary;;   KNEE ARTHROSCOPY Right 2010   KNEE ARTHROSCOPY WITH SUBCHONDROPLASTY Left 01/09/2020   Procedure: Left knee arthroscopy,partial medial meniscectomy, debridement, subchondroplasty left proximal tibia;  Surgeon: Jene Every, MD;  Location: [name];  Service: Orthopedics;  Laterality: Left;   LEFT HEART CATH AND CORONARY ANGIOGRAPHY N/A 02/19/2017   Procedure: LEFT HEART CATH AND CORONARY ANGIOGRAPHY;   Surgeon: [place]il[place], Lary Eckardt M, MD;  Location: [facility] INVASIVE CV LAB;  Service: Cardiovascular;  Laterality: N/A;   TONSILLECTOMY     TUBAL LIGATION     VIDEO BRONCHOSCOPY WITH ENDOBRONCHIAL ULTRASOUND N/A 01/15/2022   Procedure: VIDEO BRONCHOSCOPY WITH ENDOBRONCHIAL ULTRASOUND;  Surgeon: Omar Person, MD;  Location: [facility] ENDOSCOPY;  Service: Pulmonary;  Laterality: N/A;   VIDEO BRONCHOSCOPY WITH RADIAL ENDOBRONCHIAL ULTRASOUND  01/15/2022   Procedure: RADIAL ENDOBRONCHIAL ULTRASOUND;  Surgeon: Omar Person, MD;  Location: [facility] ENDOSCOPY;  Service: Pulmonary;;    Current Medications: Outpatient Medications Prior to Visit  Medication Sig Dispense Refill   acetaminophen (TYLENOL) 500 MG tablet Take 500 mg by mouth as needed for moderate pain.     aspirin 81 MG chewable tablet Chew 1 tablet (81 mg total) by mouth daily.     atorvastatin (LIPITOR) 80 MG tablet TAKE 1 TABLET BY MOUTH DAILY AT 6 PM. 90 tablet 3   Coenzyme Q10 (CO Q 10) 100 MG CAPS Take 100 mg by mouth every evening.     diclofenac Sodium (VOLTAREN) 1 % GEL Apply 1 Application topically daily as needed (Knee pain).     ezetimibe (ZETIA) 10 MG tablet TAKE 1 TABLET BY MOUTH EVERY DAY 90 tablet 2   folic acid (FOLVITE) 1 MG tablet Take 1 tablet (1 mg total) by mouth daily. 90 tablet 1   furosemide (LASIX) 20 MG tablet Once daily only as needed for swelling of the lower extremities 30 tablet 6   irbesartan (AVAPRO) 150 MG tablet Take 1 tablet (150 mg total) by mouth daily. 90 tablet 3   levETIRAcetam (KEPPRA) 500 MG tablet TAKE 1 TABLET BY MOUTH TWICE A DAY 180 tablet 1   metoprolol tartrate (LOPRESSOR) 25 MG tablet TAKE 1 TABLET BY MOUTH TWICE A DAY 90 tablet 3   mirtazapine (REMERON) 15 MG tablet TAKE 1 TABLET BY MOUTH EVERYDAY AT BEDTIME 90 tablet 1   nitroGLYCERIN (NITROSTAT) 0.4 MG SL tablet Place 1 tablet (0.4 mg total) under the tongue every 5 (five) minutes x 3 doses as needed for chest pain. 25 tablet 2   ondansetron (ZOFRAN) 8  MG tablet Take 1 tablet (8 mg total) by mouth every 8 (eight) hours as needed for nausea or vomiting. Starting day 3 after chemotherapy (Patient not taking: Reported on 10/18/2022) 30 tablet 2   pantoprazole (PROTONIX) 40 MG tablet TAKE 1 TABLET BY MOUTH EVERY DAY 90 tablet 1   prochlorperazine (COMPAZINE) 10 MG tablet TAKE 1 TABLET BY MOUTH EVERY 6 HOURS AS NEEDED 30 tablet 2   traMADol (ULTRAM) 50 MG tablet Take 50 mg by mouth daily as needed for moderate pain. (Patient not taking: Reported on 10/18/2022)     No facility-administered medications prior to visit.     Allergies:   Codeine   Social History   Socioeconomic History   Marital status: Married    Spouse name: Not on file   Number of children: 3   Years of education: Not on file   Highest education level: Not  on file  Occupational History   Not on file  Tobacco Use   Smoking status: Some Days    Current packs/day: 1.50    Average packs/day: 1.5 packs/day for 50.0 years (75.0 ttl pk-yrs)    Types: Cigarettes   Smokeless tobacco: Never   Tobacco comments:    Trying to quit, currently smokes 5-6 cigarettes per day 01/21/2022  Vaping Use   Vaping status: Never Used  Substance and Sexual Activity   Alcohol use: Yes    Comment: occasional   Drug use: Never   Sexual activity: Not Currently    Birth control/protection: Surgical    Comment: Hysterectomy//Tubal Ligation  Other Topics Concern   Not on file  Social History Narrative   Family runs the [employer].   Social Determinants of Health   Financial Resource Strain: Not on file  Food Insecurity: No Food Insecurity (10/05/2022)   Hunger Vital Sign    Worried About Running Out of Food in the Last Year: Never true    Ran Out of Food in the Last Year: Never true  Transportation Needs: No Transportation Needs (10/05/2022)   PRAPARE - Administrator, Civil Service (Medical): No    Lack of Transportation (Non-Medical): No  Physical Activity: Not on file   Stress: Not on file  Social Connections: Not on file     Family History:  The patient's family history includes Diabetes in her mother; Heart attack in her sister; Lung cancer in her father.   ROS:   Please see the history of present illness.    ROS All other systems reviewed and are negative.   PHYSICAL EXAM:   VS:  There were no vitals taken for this visit.   GENERAL:  Well appearing, overweight,,  WF in NAD HEENT:  PERRL, EOMI, sclera are clear. Oropharynx is clear. NECK:  No jugular venous distention, carotid upstroke brisk and symmetric, no bruits, no thyromegaly or adenopathy LUNGS:  Clear to auscultation bilaterally CHEST:  Unremarkable HEART:  RRR,  PMI not displaced or sustained,S1 and S2 within normal limits, no S3, no S4: no clicks, no rubs, no murmurs ABD:  Soft, nontender. BS +, no masses or bruits. No hepatomegaly, no splenomegaly EXT:  2 + pulses throughout,2+ edema, skin in legs dry and scaly SKIN:  Warm and dry.  No rashes NEURO:  Alert and oriented x 3. Cranial nerves II through XII intact. PSYCH:  Cognitively intact    Wt Readings from Last 3 Encounters:  11/08/22 155 lb 12.8 oz (70.7 kg)  10/26/22 156 lb 3.2 oz (70.9 kg)  10/18/22 158 lb 6.4 oz (71.8 kg)      Studies/Labs Reviewed:   EKG:  EKG is not ordered today.     Recent Labs: 07/19/2022: BNP 33.4 10/18/2022: TSH 0.990 11/08/2022: ALT 30; BUN 19; Creatinine 0.78; Hemoglobin 9.1; Platelet Count 366; Potassium 3.3; Sodium 136   Lipid Panel    Component Value Date/Time   CHOL 132 07/19/2022 1620   TRIG 106 07/19/2022 1620   HDL 60 07/19/2022 1620   CHOLHDL 2.2 07/19/2022 1620   CHOLHDL 4.0 02/20/2017 0524   VLDL 16 02/20/2017 0524   LDLCALC 53 07/19/2022 1620    Additional studies/ records that were reviewed today include:   Cath 02/19/2017 Conclusion     Prox LAD to Mid LAD lesion, 20 %stenosed. Prox RCA to Mid RCA lesion, 90 %stenosed. Prox Cx to Mid Cx lesion, 30 %stenosed. The  left ventricular systolic function is  normal. LV end diastolic pressure is moderately elevated. The left ventricular ejection fraction is 50-55% by visual estimate. 1st Mrg lesion, 99 %stenosed. A STENT SIERRA 3.50 X 15 MM drug eluting stent was successfully placed. Post intervention, there is a 0% residual stenosis. Lat 1st Mrg lesion, 100 %stenosed.   1. Severe 2 vessel obstructive CAD    - 99% thrombotic occlusion of first OM. This is a bifurcating vessel.     - 90% mid RCA-segmental 2. Good overall LV dysfunction with lateral HK 3. Moderately elevated LVEDP 4. Successful stenting of the first OM with DES. Distal embolization into the distal lateral OM branch.   Plan: DAPT for one year. Will treat with IV Aggrastat for 18 hours. Start high dose statin, beta blocker. IV Ntg for BP control acutely. Plan for stage PCI of RCA on Monday if no complication.      Cath 02/21/2017 Conclusion   Successful stent implantation in the proximal to mid RCA reducing a segmental 90% stenosis to 0% with TIMI grade 3 flow. Stent used was a 3.0 x 26 Onyx post dilated to 14 atm 2.   RECOMMENDATIONS:   Continue dual antiplatelet therapy (aspirin and Brilinta) Discharge per discretion of IC team.   Echo 08/03/22: IMPRESSIONS     1. Left ventricular ejection fraction, by estimation, is 65 to 70%. The  left ventricle has normal function. The left ventricle has no regional  wall motion abnormalities. Left ventricular diastolic parameters are  consistent with Grade I diastolic  dysfunction (impaired relaxation).   2. Right ventricular systolic function is normal. The right ventricular  size is normal. Tricuspid regurgitation signal is inadequate for assessing  PA pressure.   3. The mitral valve is normal in structure. No evidence of mitral valve  regurgitation. No evidence of mitral stenosis.   4. The aortic valve is normal in structure. Aortic valve regurgitation is  not visualized. Aortic valve  sclerosis/calcification is present, without  any evidence of aortic stenosis.   5. The inferior vena cava is normal in size with greater than 50%  respiratory variability, suggesting right atrial pressure of 3 mmHg.      ASSESSMENT:    No diagnosis found.    PLAN:  In order of problems listed above:  CAD: s/p STEMI in October 2018 with DES the LCx and RCA. She is asymptomatic. Continue ASA, lipitor, beta blocker.   Hypertension: Blood pressure is  controlled. Concerned about low BP with diuretics. Will reduce irbesartan to 150 mg daily. Continue current meds  Hyperlipidemia: on high dose lipitor and Zetia. Will update labs today.   Tobacco abuse:  Counseled on complete cessation.   5.   Stage IV small cell lung CA with brain mets.   6.   LE edema. Likely related to chemotherapy. Will check BNP level. Update Echo. Recommend she take lasix 20 mg daily. Restrict sodium intake. Compression hose.     Medication Adjustments/Labs and Tests Ordered: Current medicines are reviewed at length with the patient today.  Concerns regarding medicines are outlined above.  Medication changes, Labs and Tests ordered today are listed in the Patient Instructions below. There are no Patient Instructions on file for this visit.   Follow up in 2 months.  Signed, Tyanne Derocher Swaziland, MD  01/10/2023 12:56 PM    [facility] [address] [place], [place], [place]  [address] Phone: [phone]; Fax: [phone]

## 2023-01-14 ENCOUNTER — Ambulatory Visit: Payer: Self-pay | Admitting: Cardiology

## 2023-01-29 ENCOUNTER — Other Ambulatory Visit: Payer: Self-pay | Admitting: Cardiology
